# Patient Record
Sex: Female | Born: 1990 | Race: Black or African American | Hispanic: No | Marital: Single | Smoking: Never smoker
Health system: Southern US, Community
[De-identification: ages and names within clinical notes are randomized; demographics above are authoritative.]

## PROBLEM LIST (undated history)

## (undated) ENCOUNTER — Inpatient Hospital Stay (HOSPITAL_COMMUNITY): Payer: Self-pay

## (undated) DIAGNOSIS — F329 Major depressive disorder, single episode, unspecified: Secondary | ICD-10-CM

## (undated) DIAGNOSIS — R87619 Unspecified abnormal cytological findings in specimens from cervix uteri: Secondary | ICD-10-CM

## (undated) DIAGNOSIS — IMO0002 Reserved for concepts with insufficient information to code with codable children: Secondary | ICD-10-CM

## (undated) DIAGNOSIS — F32A Depression, unspecified: Secondary | ICD-10-CM

## (undated) DIAGNOSIS — F419 Anxiety disorder, unspecified: Secondary | ICD-10-CM

## (undated) DIAGNOSIS — I10 Essential (primary) hypertension: Secondary | ICD-10-CM

## (undated) DIAGNOSIS — O139 Gestational [pregnancy-induced] hypertension without significant proteinuria, unspecified trimester: Secondary | ICD-10-CM

## (undated) HISTORY — PX: THERAPEUTIC ABORTION: SHX798

## (undated) HISTORY — DX: Gestational (pregnancy-induced) hypertension without significant proteinuria, unspecified trimester: O13.9

## (undated) HISTORY — DX: Anxiety disorder, unspecified: F41.9

---
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
## 2012-02-06 NOTE — L&D Delivery Note (Signed)
Delivery Note At  a viable unspecified sex was delivered via  (Presentation: ;  ).  APGAR: , ; weight .   Placenta status: , .  Cord:  with the following complications: .  Cord pH: not done  Anesthesia: Epidural  Episiotomy:  Lacerations:  Suture Repair: 2.0 Est. Blood Loss (mL):   Mom to postpartum.  Baby to nursery-stable.  Aylah Yeary A 11/12/2012, 4:00 AM

## 2012-03-22 ENCOUNTER — Inpatient Hospital Stay (HOSPITAL_COMMUNITY): Payer: Medicaid Other

## 2012-03-22 ENCOUNTER — Inpatient Hospital Stay (HOSPITAL_COMMUNITY)
Admission: AD | Admit: 2012-03-22 | Discharge: 2012-03-22 | Disposition: A | Discharge status: Home / Self Care | Payer: Medicaid Other | Source: Ambulatory Visit | Attending: Obstetrics & Gynecology | Admitting: Obstetrics & Gynecology

## 2012-03-22 ENCOUNTER — Encounter (HOSPITAL_COMMUNITY): Payer: Self-pay | Admitting: Obstetrics and Gynecology

## 2012-03-22 DIAGNOSIS — R109 Unspecified abdominal pain: Secondary | ICD-10-CM

## 2012-03-22 DIAGNOSIS — B3731 Acute candidiasis of vulva and vagina: Secondary | ICD-10-CM | POA: Insufficient documentation

## 2012-03-22 DIAGNOSIS — O26899 Other specified pregnancy related conditions, unspecified trimester: Secondary | ICD-10-CM

## 2012-03-22 DIAGNOSIS — B373 Candidiasis of vulva and vagina: Secondary | ICD-10-CM | POA: Insufficient documentation

## 2012-03-22 DIAGNOSIS — O2390 Unspecified genitourinary tract infection in pregnancy, unspecified trimester: Secondary | ICD-10-CM | POA: Insufficient documentation

## 2012-03-22 DIAGNOSIS — R1032 Left lower quadrant pain: Secondary | ICD-10-CM | POA: Insufficient documentation

## 2012-03-22 DIAGNOSIS — O209 Hemorrhage in early pregnancy, unspecified: Secondary | ICD-10-CM | POA: Insufficient documentation

## 2012-03-22 LAB — URINALYSIS, ROUTINE W REFLEX MICROSCOPIC
Bilirubin Urine: NEGATIVE
Glucose, UA: NEGATIVE mg/dL
Hgb urine dipstick: NEGATIVE
Ketones, ur: NEGATIVE mg/dL
Protein, ur: NEGATIVE mg/dL
Urobilinogen, UA: 1 mg/dL (ref 0.0–1.0)

## 2012-03-22 LAB — CBC
MCV: 83.9 fL (ref 78.0–100.0)
Platelets: 276 10*3/uL (ref 150–400)
RBC: 4.61 MIL/uL (ref 3.87–5.11)
RDW: 13 % (ref 11.5–15.5)
WBC: 6.1 10*3/uL (ref 4.0–10.5)

## 2012-03-22 LAB — ABO/RH: ABO/RH(D): A POS

## 2012-03-22 LAB — WET PREP, GENITAL: Yeast Wet Prep HPF POC: NONE SEEN

## 2012-03-22 LAB — HCG, QUANTITATIVE, PREGNANCY: hCG, Beta Chain, Quant, S: 8202 m[IU]/mL — ABNORMAL HIGH (ref <5)

## 2012-03-22 LAB — URINE MICROSCOPIC-ADD ON

## 2012-03-22 MED ORDER — FLUCONAZOLE 150 MG PO TABS
150.0000 mg | ORAL_TABLET | Freq: Once | ORAL | Status: AC
  Administered 2012-03-22: 150 mg via ORAL
  Filled 2012-03-22: qty 1

## 2012-03-22 NOTE — MAU Note (Signed)
Pt presents to MAU with chief complaint of right and left sided pain. She had a positive pregnancy test on Tuesday.  The pain comes and goes and she thinks it may be due to her working to hard.

## 2012-03-22 NOTE — MAU Provider Note (Signed)
History     CSN: [id number]  Arrival date and time: 03/22/12 4098   First Provider Initiated Contact with Patient 03/22/12 1040      Chief Complaint  Patient presents with  . right side pain    . Possible Pregnancy   HPI Joy Rivera is a 22 y.o. G1P0000 at [redacted]w[redacted]d who presents to MAU today with LLQ pain. The patient has had pelvic pain yesterday also, but worse today. She rates it at 5/10 right now. She has had some nausea without vomiting. She denies abnormal vaginal discharge, vaginal bleeding or LOF. She has had some frequency without urgency or dysuria. She also complains of incomplete emptying of the bladder at times. She denies fever. LMP was 02/14/12.   OB History   Grav Para Term Preterm Abortions TAB SAB Ect Mult Living   1 0 0 0 0 0 0 0 0 0       Past Medical History  Diagnosis Date  . Medical history non-contributory     Past Surgical History  Procedure Laterality Date  . No past surgeries      History reviewed. No pertinent family history.  History  Substance Use Topics  . Smoking status: Never Smoker   . Smokeless tobacco: Not on file  . Alcohol Use: No    Allergies: No Known Allergies  No prescriptions prior to admission    Review of Systems  Constitutional: Negative for fever, chills and malaise/fatigue.  Gastrointestinal: Positive for nausea and abdominal pain. Negative for heartburn, vomiting, diarrhea and constipation.  Genitourinary: Negative for dysuria, urgency and frequency.        Neg vaginal bleeding Neg abnormal discharge Neg vaginal odor  Skin: Positive for itching (vaginal).   Physical Exam   Blood pressure 126/68, pulse 86, temperature 97.9 F (36.6 C), temperature source Oral, resp. rate 18, height 5\' 3"  (1.6 m), weight 170 lb 6.4 oz (77.293 kg), last menstrual period 02/14/2012.  Physical Exam  Constitutional: She is oriented to person, place, and time. She appears well-developed and well-nourished. No distress.  HENT:   Head: Normocephalic and atraumatic.  Cardiovascular: Normal rate, regular rhythm and normal heart sounds.   Respiratory: Effort normal and breath sounds normal. No respiratory distress.  GI: Soft. Bowel sounds are normal. She exhibits no distension and no mass. There is tenderness (mild tenderness to palpation of the LLQ). There is no rebound and no guarding.  Genitourinary: Vagina normal. There is no rash on the right labia. There is no rash on the left labia. Uterus is not enlarged and not tender. Cervix exhibits discharge (thick white discharge noted on the vaginal walls). Cervix exhibits no motion tenderness and no friability. Right adnexum displays no mass and no tenderness. Left adnexum displays no mass and no tenderness.  Neurological: She is alert and oriented to person, place, and time.  Skin: Skin is warm and dry.  Psychiatric: She has a normal mood and affect.   Results for orders placed during the hospital encounter of 03/22/12 (from the past 24 hour(s))  URINALYSIS, ROUTINE W REFLEX MICROSCOPIC     Status: Abnormal   Collection Time    03/22/12 10:00 AM      Result Value Range   Color, Urine YELLOW  YELLOW   APPearance CLEAR  CLEAR   Specific Gravity, Urine 1.025  1.005 - 1.030   pH 6.0  5.0 - 8.0   Glucose, UA NEGATIVE  NEGATIVE mg/dL   Hgb urine dipstick NEGATIVE  NEGATIVE  Bilirubin Urine NEGATIVE  NEGATIVE   Ketones, ur NEGATIVE  NEGATIVE mg/dL   Protein, ur NEGATIVE  NEGATIVE mg/dL   Urobilinogen, UA 1.0  0.0 - 1.0 mg/dL   Nitrite NEGATIVE  NEGATIVE   Leukocytes, UA TRACE (*) NEGATIVE  URINE MICROSCOPIC-ADD ON     Status: Abnormal   Collection Time    03/22/12 10:00 AM      Result Value Range   Squamous Epithelial / LPF FEW (*) RARE   WBC, UA 0-2  <3 WBC/hpf   RBC / HPF 0-2  <3 RBC/hpf   Bacteria, UA FEW (*) RARE  POCT PREGNANCY, URINE     Status: Abnormal   Collection Time    03/22/12 10:05 AM      Result Value Range   Preg Test, Ur POSITIVE (*) NEGATIVE   WET PREP, GENITAL     Status: Abnormal   Collection Time    03/22/12 10:49 AM      Result Value Range   Yeast Wet Prep HPF POC NONE SEEN  NONE SEEN   Trich, Wet Prep NONE SEEN  NONE SEEN   Clue Cells Wet Prep HPF POC FEW (*) NONE SEEN   WBC, Wet Prep HPF POC FEW (*) NONE SEEN  HCG, QUANTITATIVE, PREGNANCY     Status: Abnormal   Collection Time    03/22/12 10:53 AM      Result Value Range   hCG, Beta Chain, Quant, S 8202 (*) <5 mIU/mL  ABO/RH     Status: None   Collection Time    03/22/12 10:53 AM      Result Value Range   ABO/RH(D) A POS    CBC     Status: None   Collection Time    03/22/12 10:53 AM      Result Value Range   WBC 6.1  4.0 - 10.5 K/uL   RBC 4.61  3.87 - 5.11 MIL/uL   Hemoglobin 13.0  12.0 - 15.0 g/dL   HCT 16.1  09.6 - 04.5 %   MCV 83.9  78.0 - 100.0 fL   MCH 28.2  26.0 - 34.0 pg   MCHC 33.6  30.0 - 36.0 g/dL   RDW 40.9  81.1 - 91.4 %   Platelets 276  150 - 400 K/uL   *RADIOLOGY REPORT*  Clinical Data: Pelvic pain  OBSTETRIC <14 WK [place] AND TRANSVAGINAL OB US  Technique: Both transabdominal and transvaginal ultrasound  examinations were performed for complete evaluation of the  gestation as well as the maternal uterus, adnexal regions, and  pelvic cul-de-sac. Transvaginal technique was performed to assess  early pregnancy.  Comparison: None.  Intrauterine gestational sac: Visualized. Decidual ring noted.  Yolk sac: Present.  Embryo: Present.  Cardiac Activity: Present.  Heart Rate: 102 bpm  CRL: 2.4 mm five w six d [place] EDC: 11/16/2012  Maternal uterus/adnexae:  Ovaries within normal limits. Corpus luteum cyst in the left  ovary. Trace free fluid. Small subchorionic hemorrhages noted. It  is 5 x 4 x 15 mm.  IMPRESSION:  Single live intrauterine pregnancy with an estimated gestational  age of [redacted] weeks and 6 days. Fetal heart rate is 102 beats per  minute. Small subchorionic hemorrhage is associated.  Original Report Authenticated By: Jolaine Click, M.D.    MAU Course  Procedures None  MDM UA, Wet prep, GC/Chlamydia, CBC, ABO/Rh, Quant hCG and [place] today  Assessment and Plan  A: IUP at 5w 6d with normal cardiac activity Small subchorionic hemorrhage  Yeast vaginitis, clinical  P: Diflucan given in MAU today Bleeding precautions discussed Patient encouraged to call to start prenatal care with the OB of her choice Patient encouraged to return if her discharge were to worsen Pregnancy verification letter given Patient may return to MAU as needed  Freddi Starr, PA-C 03/22/2012, 10:40 AM

## 2012-03-22 NOTE — MAU Note (Signed)
Pt presents with complaints of pain in her right side and some cramps. States she took a home pregnancy test and it was positive last week.

## 2012-05-20 LAB — OB RESULTS CONSOLE RPR: RPR: NONREACTIVE

## 2012-05-20 LAB — OB RESULTS CONSOLE HEPATITIS B SURFACE ANTIGEN: Hepatitis B Surface Ag: NEGATIVE

## 2012-05-20 LAB — OB RESULTS CONSOLE RUBELLA ANTIBODY, IGM: Rubella: IMMUNE

## 2012-09-02 ENCOUNTER — Inpatient Hospital Stay (HOSPITAL_COMMUNITY)
Admission: AD | Admit: 2012-09-02 | Discharge: 2012-09-03 | Disposition: A | Discharge status: Home / Self Care | Payer: Medicaid Other | Source: Ambulatory Visit | Attending: Obstetrics | Admitting: Obstetrics

## 2012-09-02 DIAGNOSIS — M549 Dorsalgia, unspecified: Secondary | ICD-10-CM | POA: Insufficient documentation

## 2012-09-02 DIAGNOSIS — N949 Unspecified condition associated with female genital organs and menstrual cycle: Secondary | ICD-10-CM | POA: Insufficient documentation

## 2012-09-02 DIAGNOSIS — R109 Unspecified abdominal pain: Secondary | ICD-10-CM | POA: Insufficient documentation

## 2012-09-02 DIAGNOSIS — O99891 Other specified diseases and conditions complicating pregnancy: Secondary | ICD-10-CM | POA: Insufficient documentation

## 2012-09-02 HISTORY — DX: Depression, unspecified: F32.A

## 2012-09-02 HISTORY — DX: Major depressive disorder, single episode, unspecified: F32.9

## 2012-09-02 LAB — URINALYSIS, ROUTINE W REFLEX MICROSCOPIC
Glucose, UA: NEGATIVE mg/dL
Leukocytes, UA: NEGATIVE
Nitrite: NEGATIVE
pH: 6 (ref 5.0–8.0)

## 2012-09-03 ENCOUNTER — Encounter (HOSPITAL_COMMUNITY): Payer: Self-pay

## 2012-09-03 DIAGNOSIS — O9989 Other specified diseases and conditions complicating pregnancy, childbirth and the puerperium: Secondary | ICD-10-CM

## 2012-09-03 LAB — GC/CHLAMYDIA PROBE AMP: CT Probe RNA: NEGATIVE

## 2012-09-03 LAB — WET PREP, GENITAL
Trich, Wet Prep: NONE SEEN
Yeast Wet Prep HPF POC: NONE SEEN

## 2012-09-03 MED ORDER — LACTATED RINGERS IV BOLUS (SEPSIS)
1000.0000 mL | Freq: Once | INTRAVENOUS | Status: AC
  Administered 2012-09-03: 1000 mL via INTRAVENOUS

## 2012-09-03 NOTE — MAU Provider Note (Signed)
History     CSN: [id number]  Arrival date and time: 09/02/12 2323   None     No chief complaint on file.  HPI  Joy Rivera is a 22 y.o. G1P0000 at [redacted]w[redacted]d who presents today with cramping and vaginal discharge. She denies any VB or LOF and confirms fetal movement. She has not had any complications with the pregnancy at this time. She does admit to "being sad". She denies any homicidal or suicide al ideations. She has not spoke with Dr. Gaynell Face about this, but feels she has support from her family and friends.   Past Medical History  Diagnosis Date  . Medical history non-contributory   . Depression     Past Surgical History  Procedure Laterality Date  . No past surgeries      History reviewed. No pertinent family history.  History  Substance Use Topics  . Smoking status: Never Smoker   . Smokeless tobacco: Not on file  . Alcohol Use: No    Allergies: No Known Allergies  Prescriptions prior to admission  Medication Sig Dispense Refill  . diphenhydrAMINE (BENADRYL) 50 MG capsule Take 50 mg by mouth every 6 (six) hours as needed for itching.      . Prenatal Vit-Fe Fumarate-FA (PRENATAL MULTIVITAMIN) TABS Take 1 tablet by mouth daily at 12 noon.        ROS Physical Exam   Blood pressure 114/63, pulse 88, temperature 97.6 F (36.4 C), temperature source Oral, resp. rate 18, last menstrual period 02/14/2012, SpO2 100.00%.  Physical Exam  Nursing note and vitals reviewed. Constitutional: She is oriented to person, place, and time. She appears well-developed and well-nourished. No distress.  Cardiovascular: Normal rate.   Respiratory: Effort normal.  GI: Soft. There is no tenderness.  Genitourinary:   External: no lesion Vagina: small amount of white discharge Cervix: pink, smooth, closed/thick/high Uterus: AGA   Neurological: She is alert and oriented to person, place, and time.  Skin: Skin is warm and dry.  Psychiatric: She has a normal mood and affect.    FHT Cat I for 28 weeks Toco: one UC and no others MAU Course  Procedures  Results for orders placed during the hospital encounter of 09/02/12 (from the past 24 hour(s))  URINALYSIS, ROUTINE W REFLEX MICROSCOPIC     Status: Abnormal   Collection Time    09/02/12 11:30 PM      Result Value Range   Color, Urine YELLOW  YELLOW   APPearance CLEAR  CLEAR   Specific Gravity, Urine >1.030 (*) 1.005 - 1.030   pH 6.0  5.0 - 8.0   Glucose, UA NEGATIVE  NEGATIVE mg/dL   Hgb urine dipstick NEGATIVE  NEGATIVE   Bilirubin Urine NEGATIVE  NEGATIVE   Ketones, ur 15 (*) NEGATIVE mg/dL   Protein, ur NEGATIVE  NEGATIVE mg/dL   Urobilinogen, UA 0.2  0.0 - 1.0 mg/dL   Nitrite NEGATIVE  NEGATIVE   Leukocytes, UA NEGATIVE  NEGATIVE  WET PREP, GENITAL     Status: Abnormal   Collection Time    09/03/12 12:35 AM      Result Value Range   Yeast Wet Prep HPF POC NONE SEEN  NONE SEEN   Trich, Wet Prep NONE SEEN  NONE SEEN   Clue Cells Wet Prep HPF POC FEW (*) NONE SEEN   WBC, Wet Prep HPF POC MODERATE (*) NONE SEEN     Assessment and Plan   1. Back pain complicating pregnancy in third trimester  Comfort measures reviewed Discuss feelings of sadness with Dr. Gaynell Face Return to MAU as needed   Tawnya Crook 09/03/2012, 1:42 AM

## 2012-09-03 NOTE — MAU Note (Signed)
Per Zorita Pang CNM, pt may come off monitor.

## 2012-10-07 LAB — OB RESULTS CONSOLE GBS: GBS: NEGATIVE

## 2012-10-28 ENCOUNTER — Encounter (HOSPITAL_COMMUNITY): Payer: Self-pay | Admitting: General Practice

## 2012-10-28 ENCOUNTER — Inpatient Hospital Stay (HOSPITAL_COMMUNITY)
Admission: AD | Admit: 2012-10-28 | Discharge: 2012-10-28 | Disposition: A | Discharge status: Home / Self Care | Payer: Medicaid Other | Source: Ambulatory Visit | Attending: Obstetrics | Admitting: Obstetrics

## 2012-10-28 DIAGNOSIS — Z3689 Encounter for other specified antenatal screening: Secondary | ICD-10-CM

## 2012-10-28 DIAGNOSIS — O4700 False labor before 37 completed weeks of gestation, unspecified trimester: Secondary | ICD-10-CM | POA: Insufficient documentation

## 2012-10-28 DIAGNOSIS — O368130 Decreased fetal movements, third trimester, not applicable or unspecified: Secondary | ICD-10-CM

## 2012-10-28 DIAGNOSIS — O368190 Decreased fetal movements, unspecified trimester, not applicable or unspecified: Secondary | ICD-10-CM | POA: Insufficient documentation

## 2012-10-28 NOTE — MAU Note (Signed)
Per L Left-Kirby CNM pt may come off monitor

## 2012-10-28 NOTE — MAU Provider Note (Signed)
Chief Complaint:  Decreased Fetal Movement   None     HPI: Joy Rivera is a 22 y.o. G1P0000 at 51w5dwho presents to maternity admissions sent from the office for decreased fetal movement.  She reports feeling movement, but not large kicks she is used to feeling every day.  She denies regular contractions, LOF, vaginal bleeding, vaginal itching/burning, urinary symptoms, h/a, dizziness, n/v, or fever/chills.    Past Medical History: Past Medical History  Diagnosis Date  . Medical history non-contributory   . Depression     Past obstetric history: OB History  Gravida Para Term Preterm AB SAB TAB Ectopic Multiple Living  1 0 0 0 0 0 0 0 0 0     # Outcome Date GA Lbr Len/2nd Weight Sex Delivery Anes PTL Lv  1 CUR               Past Surgical History: Past Surgical History  Procedure Laterality Date  . No past surgeries      Family History: History reviewed. No pertinent family history.  Social History: History  Substance Use Topics  . Smoking status: Never Smoker   . Smokeless tobacco: Not on file  . Alcohol Use: No    Allergies: No Known Allergies  Meds:  Prescriptions prior to admission  Medication Sig Dispense Refill  . Prenatal Vit-Fe Fumarate-FA (PRENATAL MULTIVITAMIN) TABS Take 1 tablet by mouth daily at 12 noon.      . [DISCONTINUED] diphenhydrAMINE (BENADRYL) 50 MG capsule Take 50 mg by mouth every 6 (six) hours as needed for itching.        ROS: Pertinent findings in history of present illness.  Physical Exam  Blood pressure 115/66, pulse 111, temperature 98.3 F (36.8 C), temperature source Oral, resp. rate 18, height 5' 2.5" (1.588 m), weight 85.73 kg (189 lb), last menstrual period 02/14/2012. GENERAL: Well-developed, well-nourished female in no acute distress.  HEENT: normocephalic HEART: normal rate RESP: normal effort ABDOMEN: Soft, non-tender, gravid appropriate for gestational age EXTREMITIES: Nontender, no edema NEURO: alert and  oriented    FHT:  Baseline 135 , moderate variability, accelerations present, no decelerations Contractions: occasional, mild to palpation    Assessment: 1. NST (non-stress test) reactive   2. Decreased fetal movement in pregnancy in third trimester, antepartum     Plan: Consult Dr Gaynell Face Discharge home Labor precautions and fetal kick counts F/U in office Return to [facility] as needed    Medication List    ASK your doctor about these medications       diphenhydrAMINE 50 MG capsule  Commonly known as:  BENADRYL  Take 50 mg by mouth every 6 (six) hours as needed for itching.     prenatal multivitamin Tabs tablet  Take 1 tablet by mouth daily at 12 noon.        Sharen Counter Certified Nurse-Midwife 10/28/2012 8:25 PM

## 2012-10-28 NOTE — MAU Note (Signed)
No longer feeling kicks or punches; will move from side to side.   Sent from office for NST

## 2012-11-11 ENCOUNTER — Inpatient Hospital Stay (HOSPITAL_COMMUNITY)
Admission: AD | Admit: 2012-11-11 | Discharge: 2012-11-14 | DRG: 775 | Disposition: A | Discharge status: Home / Self Care | Payer: Medicaid Other | Source: Ambulatory Visit | Attending: Obstetrics | Admitting: Obstetrics

## 2012-11-11 ENCOUNTER — Inpatient Hospital Stay (HOSPITAL_COMMUNITY): Payer: Medicaid Other | Admitting: Anesthesiology

## 2012-11-11 ENCOUNTER — Inpatient Hospital Stay (HOSPITAL_COMMUNITY)
Admission: AD | Admit: 2012-11-11 | Discharge: 2012-11-11 | Disposition: A | Discharge status: Home / Self Care | Payer: Medicaid Other | Source: Ambulatory Visit | Attending: Obstetrics | Admitting: Obstetrics

## 2012-11-11 ENCOUNTER — Encounter (HOSPITAL_COMMUNITY): Payer: Self-pay | Admitting: Anesthesiology

## 2012-11-11 ENCOUNTER — Encounter (HOSPITAL_COMMUNITY): Payer: Self-pay | Admitting: *Deleted

## 2012-11-11 DIAGNOSIS — O479 False labor, unspecified: Secondary | ICD-10-CM | POA: Insufficient documentation

## 2012-11-11 HISTORY — DX: Unspecified abnormal cytological findings in specimens from cervix uteri: R87.619

## 2012-11-11 HISTORY — DX: Reserved for concepts with insufficient information to code with codable children: IMO0002

## 2012-11-11 LAB — RPR: RPR Ser Ql: NONREACTIVE

## 2012-11-11 LAB — CBC
HCT: 35.9 % — ABNORMAL LOW (ref 36.0–46.0)
Hemoglobin: 12.3 g/dL (ref 12.0–15.0)
MCH: 28 pg (ref 26.0–34.0)
MCHC: 34.3 g/dL (ref 30.0–36.0)
MCV: 81.6 fL (ref 78.0–100.0)
RBC: 4.4 MIL/uL (ref 3.87–5.11)
RDW: 13.2 % (ref 11.5–15.5)

## 2012-11-11 MED ORDER — ACETAMINOPHEN-CODEINE #3 300-30 MG PO TABS
1.0000 | ORAL_TABLET | Freq: Once | ORAL | Status: AC
  Administered 2012-11-11: 1 via ORAL
  Filled 2012-11-11: qty 1

## 2012-11-11 MED ORDER — BUTORPHANOL TARTRATE 1 MG/ML IJ SOLN
1.0000 mg | INTRAMUSCULAR | Status: DC | PRN
  Administered 2012-11-11: 1 mg via INTRAVENOUS
  Filled 2012-11-11: qty 1

## 2012-11-11 MED ORDER — LACTATED RINGERS IV SOLN: 500.0000 mL | INTRAVENOUS | Status: DC | PRN

## 2012-11-11 MED ORDER — PHENYLEPHRINE 40 MCG/ML (10ML) SYRINGE FOR IV PUSH (FOR BLOOD PRESSURE SUPPORT)
80.0000 ug | PREFILLED_SYRINGE | INTRAVENOUS | Status: DC | PRN
  Filled 2012-11-11: qty 2

## 2012-11-11 MED ORDER — PHENYLEPHRINE 40 MCG/ML (10ML) SYRINGE FOR IV PUSH (FOR BLOOD PRESSURE SUPPORT)
80.0000 ug | PREFILLED_SYRINGE | INTRAVENOUS | Status: DC | PRN
  Filled 2012-11-11: qty 5
  Filled 2012-11-11: qty 2

## 2012-11-11 MED ORDER — OXYTOCIN 40 UNITS IN LACTATED RINGERS INFUSION - SIMPLE MED
1.0000 m[IU]/min | INTRAVENOUS | Status: DC
  Administered 2012-11-11: 4 m[IU]/min via INTRAVENOUS
  Administered 2012-11-11: 6 m[IU]/min via INTRAVENOUS
  Administered 2012-11-11: 2 m[IU]/min via INTRAVENOUS
  Filled 2012-11-11: qty 1000

## 2012-11-11 MED ORDER — IBUPROFEN 600 MG PO TABS
600.0000 mg | ORAL_TABLET | Freq: Four times a day (QID) | ORAL | Status: DC | PRN
  Administered 2012-11-12: 600 mg via ORAL
  Filled 2012-11-11 (×2): qty 1

## 2012-11-11 MED ORDER — LIDOCAINE HCL (PF) 1 % IJ SOLN
30.0000 mL | INTRAMUSCULAR | Status: DC | PRN
  Filled 2012-11-11 (×2): qty 30

## 2012-11-11 MED ORDER — TERBUTALINE SULFATE 1 MG/ML IJ SOLN: 0.2500 mg | Freq: Once | INTRAMUSCULAR | Status: AC | PRN

## 2012-11-11 MED ORDER — LACTATED RINGERS IV SOLN
INTRAVENOUS | Status: DC
  Administered 2012-11-11 (×5): via INTRAVENOUS

## 2012-11-11 MED ORDER — EPHEDRINE 5 MG/ML INJ
10.0000 mg | INTRAVENOUS | Status: DC | PRN
  Administered 2012-11-11: 10 mg via INTRAVENOUS
  Filled 2012-11-11: qty 2

## 2012-11-11 MED ORDER — OXYCODONE-ACETAMINOPHEN 5-325 MG PO TABS
1.0000 | ORAL_TABLET | ORAL | Status: DC | PRN
  Administered 2012-11-12: 1 via ORAL
  Filled 2012-11-11: qty 1

## 2012-11-11 MED ORDER — DIPHENHYDRAMINE HCL 50 MG/ML IJ SOLN
12.5000 mg | INTRAMUSCULAR | Status: DC | PRN
  Administered 2012-11-12: 12.5 mg via INTRAVENOUS
  Filled 2012-11-11: qty 1

## 2012-11-11 MED ORDER — ONDANSETRON HCL 4 MG/2ML IJ SOLN
4.0000 mg | Freq: Four times a day (QID) | INTRAMUSCULAR | Status: DC | PRN
  Administered 2012-11-11: 4 mg via INTRAVENOUS
  Filled 2012-11-11: qty 2

## 2012-11-11 MED ORDER — FLEET ENEMA 7-19 GM/118ML RE ENEM: 1.0000 | ENEMA | RECTAL | Status: DC | PRN

## 2012-11-11 MED ORDER — FENTANYL 2.5 MCG/ML BUPIVACAINE 1/10 % EPIDURAL INFUSION (WH - ANES)
14.0000 mL/h | INTRAMUSCULAR | Status: DC | PRN
  Administered 2012-11-11 (×2): 14 mL/h via EPIDURAL
  Filled 2012-11-11 (×2): qty 125

## 2012-11-11 MED ORDER — LACTATED RINGERS IV SOLN: 500.0000 mL | Freq: Once | INTRAVENOUS | Status: DC

## 2012-11-11 MED ORDER — SODIUM BICARBONATE 8.4 % IV SOLN
INTRAVENOUS | Status: DC | PRN
  Administered 2012-11-11: 5 mL via EPIDURAL

## 2012-11-11 MED ORDER — EPHEDRINE 5 MG/ML INJ
10.0000 mg | INTRAVENOUS | Status: DC | PRN
  Filled 2012-11-11: qty 4
  Filled 2012-11-11: qty 2

## 2012-11-11 MED ORDER — OXYTOCIN BOLUS FROM INFUSION
500.0000 mL | INTRAVENOUS | Status: DC
  Administered 2012-11-12: 500 mL via INTRAVENOUS

## 2012-11-11 MED ORDER — OXYTOCIN 40 UNITS IN LACTATED RINGERS INFUSION - SIMPLE MED: 62.5000 mL/h | INTRAVENOUS | Status: DC

## 2012-11-11 MED ORDER — ACETAMINOPHEN 325 MG PO TABS: 650.0000 mg | ORAL_TABLET | ORAL | Status: DC | PRN

## 2012-11-11 MED ORDER — CITRIC ACID-SODIUM CITRATE 334-500 MG/5ML PO SOLN: 30.0000 mL | ORAL | Status: DC | PRN

## 2012-11-11 NOTE — Anesthesia Preprocedure Evaluation (Signed)

## 2012-11-11 NOTE — MAU Note (Signed)
Sent from OB's office for direct admit;

## 2012-11-11 NOTE — H&P (Signed)
This is Dr. Francoise Ceo dictating the history and physical on  Joy Rivera she's a 22 year old gravida 139 weeks and 5 days EDC 11/13/2012 negative GBS admitted in early labor cervix was 1 cm now 3 cm 100% vertex -1-2 amniotomy performed and the fluid was meconium-stained an IUPC was inserted and she is contracting every 2-3 minutes on Past medical history negative Past surgical history negative Social history negative System review noncontributory Physical exam well-developed female in labor HEENT negative Lungs clear to P&A Breasts negative Abdomen term Pelvic as described above Extremities negative and

## 2012-11-11 NOTE — Anesthesia Procedure Notes (Signed)

## 2012-11-11 NOTE — MAU Note (Signed)
Scheduled for induction in 4 days;

## 2012-11-11 NOTE — MAU Note (Signed)
C/o ucs since about 0500 this AM;

## 2012-11-12 ENCOUNTER — Encounter (HOSPITAL_COMMUNITY): Payer: Self-pay

## 2012-11-12 LAB — CBC
MCH: 28.1 pg (ref 26.0–34.0)
MCHC: 34.4 g/dL (ref 30.0–36.0)
MCV: 81.8 fL (ref 78.0–100.0)
Platelets: 161 10*3/uL (ref 150–400)
RBC: 3.95 MIL/uL (ref 3.87–5.11)
RDW: 13.3 % (ref 11.5–15.5)
WBC: 13.9 10*3/uL — ABNORMAL HIGH (ref 4.0–10.5)

## 2012-11-12 MED ORDER — TETANUS-DIPHTH-ACELL PERTUSSIS 5-2.5-18.5 LF-MCG/0.5 IM SUSP
0.5000 mL | Freq: Once | INTRAMUSCULAR | Status: AC
  Administered 2012-11-14: 0.5 mL via INTRAMUSCULAR
  Filled 2012-11-12: qty 0.5

## 2012-11-12 MED ORDER — OXYCODONE-ACETAMINOPHEN 5-325 MG PO TABS: 1.0000 | ORAL_TABLET | ORAL | Status: DC | PRN

## 2012-11-12 MED ORDER — DIPHENHYDRAMINE HCL 25 MG PO CAPS: 25.0000 mg | ORAL_CAPSULE | Freq: Four times a day (QID) | ORAL | Status: DC | PRN

## 2012-11-12 MED ORDER — ONDANSETRON HCL 4 MG/2ML IJ SOLN: 4.0000 mg | INTRAMUSCULAR | Status: DC | PRN

## 2012-11-12 MED ORDER — WITCH HAZEL-GLYCERIN EX PADS: 1.0000 "application " | MEDICATED_PAD | CUTANEOUS | Status: DC | PRN

## 2012-11-12 MED ORDER — IBUPROFEN 600 MG PO TABS
600.0000 mg | ORAL_TABLET | Freq: Four times a day (QID) | ORAL | Status: DC
  Administered 2012-11-12 – 2012-11-14 (×8): 600 mg via ORAL
  Filled 2012-11-12 (×7): qty 1

## 2012-11-12 MED ORDER — ZOLPIDEM TARTRATE 5 MG PO TABS: 5.0000 mg | ORAL_TABLET | Freq: Every evening | ORAL | Status: DC | PRN

## 2012-11-12 MED ORDER — PRENATAL MULTIVITAMIN CH: 1.0000 | ORAL_TABLET | Freq: Every day | ORAL | Status: DC

## 2012-11-12 MED ORDER — BENZOCAINE-MENTHOL 20-0.5 % EX AERO: 1.0000 "application " | INHALATION_SPRAY | CUTANEOUS | Status: DC | PRN

## 2012-11-12 MED ORDER — LANOLIN HYDROUS EX OINT: TOPICAL_OINTMENT | CUTANEOUS | Status: DC | PRN

## 2012-11-12 MED ORDER — SIMETHICONE 80 MG PO CHEW: 80.0000 mg | CHEWABLE_TABLET | ORAL | Status: DC | PRN

## 2012-11-12 MED ORDER — DIBUCAINE 1 % RE OINT: 1.0000 "application " | TOPICAL_OINTMENT | RECTAL | Status: DC | PRN

## 2012-11-12 MED ORDER — SENNOSIDES-DOCUSATE SODIUM 8.6-50 MG PO TABS
2.0000 | ORAL_TABLET | ORAL | Status: DC
  Administered 2012-11-13 – 2012-11-14 (×2): 2 via ORAL
  Filled 2012-11-12 (×2): qty 2

## 2012-11-12 MED ORDER — PRENATAL MULTIVITAMIN CH
1.0000 | ORAL_TABLET | Freq: Every day | ORAL | Status: DC
  Administered 2012-11-12 – 2012-11-13 (×2): 1 via ORAL
  Filled 2012-11-12 (×2): qty 1

## 2012-11-12 MED ORDER — FERROUS SULFATE 325 (65 FE) MG PO TABS
325.0000 mg | ORAL_TABLET | Freq: Two times a day (BID) | ORAL | Status: DC
  Administered 2012-11-12 – 2012-11-14 (×5): 325 mg via ORAL
  Filled 2012-11-12 (×5): qty 1

## 2012-11-12 MED ORDER — ONDANSETRON HCL 4 MG PO TABS: 4.0000 mg | ORAL_TABLET | ORAL | Status: DC | PRN

## 2012-11-12 NOTE — Anesthesia Postprocedure Evaluation (Signed)
Anesthesia Post Note  Patient: Joy Rivera  Procedure(s) Performed: * No procedures listed *  Anesthesia type: Epidural  Patient location: Mother/Baby  Post pain: Pain level controlled  Post assessment: Post-op Vital signs reviewed  Last Vitals:  Filed Vitals:   11/12/12 0655  BP: 101/63  Pulse: 76  Temp: 37.1 C  Resp: 18    Post vital signs: Reviewed  Level of consciousness:alert  Complications: No apparent anesthesia complications

## 2012-11-12 NOTE — Progress Notes (Signed)
Ur chart review completed.  

## 2012-11-12 NOTE — Progress Notes (Signed)
Patient ID: Joy Rivera, female   DOB: July 28, 1990, 22 y.o.   MRN: [id number] And postpartum day 0 vital signs normal Fundus firm Legs negative Doing well

## 2012-11-12 NOTE — Consult Note (Signed)
Neonatology Note:  Attendance at Delivery:  I was asked by Dr. Marshall to attend this NSVD at term due to thick meconium. The mother is a G1P0 A pos, GBS neg with an otherwise uncomplicated pregnancy. ROM 13 hours prior to delivery, fluid with moderate meconium initially, but thick at delivery. Our team arrived at about 30 seconds of life. Infant was dusky but with good tone and spontaneous cry. We performed bulb suctioning. On auscultation, there were some rales, especially on the right side, so we did chest percussion and DeLee suctioned, getting 2 ml of thick green fluid out. After this, the lungs were clear bilaterally and the baby had no distress. Color was pink in room air at 5 minutes of life. Ap 7/9. To CN to care of Pediatrician.  Joy Schuenemann C. Legacy Lacivita, MD  

## 2012-11-13 NOTE — Progress Notes (Signed)
Patient ID: Joy Rivera, female   DOB: [birth year], 22 y.o.   MRN: [id number] Postpartum day one Vital signs normal Fundus firm Lochia moderate Doing well

## 2012-11-14 ENCOUNTER — Inpatient Hospital Stay (HOSPITAL_COMMUNITY): Admission: RE | Admit: 2012-11-14 | Payer: Medicaid Other | Source: Ambulatory Visit

## 2012-11-14 NOTE — Discharge Summary (Signed)
Obstetric Discharge Summary Reason for Admission: onset of labor Prenatal Procedures: none Intrapartum Procedures: spontaneous vaginal delivery Postpartum Procedures: none Complications-Operative and Postpartum: none Hemoglobin  Date Value Range Status  11/12/2012 11.1* 12.0 - 15.0 g/dL Final     HCT  Date Value Range Status  11/12/2012 32.3* 36.0 - 46.0 % Final    Physical Exam:  General: alert Lochia: appropriate Uterine Fundus: firm Incision: healing well DVT Evaluation: No evidence of DVT seen on physical exam.  Discharge Diagnoses: Term Pregnancy-delivered  Discharge Information: Date: 11/14/2012 Activity: pelvic rest Diet: routine Medications: Percocet Condition: stable Instructions: refer to practice specific booklet Discharge to: home Follow-up Information   Follow up with Kathreen Cosier, MD.   Specialty:  Obstetrics and Gynecology   Contact information:   [address] [place] [address] [phone]       Newborn Data: Live born female  Birth Weight: 7 lb 8.1 oz (3405 g) APGAR: 7, 9  Home with mother.  MARSHALL,BERNARD A 11/14/2012, 7:34 AM

## 2013-12-07 ENCOUNTER — Encounter (HOSPITAL_COMMUNITY): Payer: Self-pay

## 2014-04-08 ENCOUNTER — Inpatient Hospital Stay (HOSPITAL_COMMUNITY)
Admission: AD | Admit: 2014-04-08 | Discharge: 2014-04-09 | Disposition: A | Discharge status: Home / Self Care | Payer: Self-pay | Source: Ambulatory Visit | Attending: Family Medicine | Admitting: Family Medicine

## 2014-04-08 DIAGNOSIS — N949 Unspecified condition associated with female genital organs and menstrual cycle: Secondary | ICD-10-CM | POA: Insufficient documentation

## 2014-04-08 DIAGNOSIS — B373 Candidiasis of vulva and vagina: Secondary | ICD-10-CM | POA: Insufficient documentation

## 2014-04-08 DIAGNOSIS — R102 Pelvic and perineal pain: Secondary | ICD-10-CM

## 2014-04-08 DIAGNOSIS — O98811 Other maternal infectious and parasitic diseases complicating pregnancy, first trimester: Secondary | ICD-10-CM | POA: Insufficient documentation

## 2014-04-08 DIAGNOSIS — B3731 Acute candidiasis of vulva and vagina: Secondary | ICD-10-CM

## 2014-04-08 DIAGNOSIS — O219 Vomiting of pregnancy, unspecified: Secondary | ICD-10-CM

## 2014-04-08 DIAGNOSIS — Z3A09 9 weeks gestation of pregnancy: Secondary | ICD-10-CM | POA: Insufficient documentation

## 2014-04-08 DIAGNOSIS — O26891 Other specified pregnancy related conditions, first trimester: Secondary | ICD-10-CM

## 2014-04-08 DIAGNOSIS — O210 Mild hyperemesis gravidarum: Secondary | ICD-10-CM | POA: Insufficient documentation

## 2014-04-09 ENCOUNTER — Inpatient Hospital Stay (HOSPITAL_COMMUNITY): Payer: Medicaid Other

## 2014-04-09 ENCOUNTER — Encounter (HOSPITAL_COMMUNITY): Payer: Self-pay | Admitting: *Deleted

## 2014-04-09 DIAGNOSIS — O219 Vomiting of pregnancy, unspecified: Secondary | ICD-10-CM

## 2014-04-09 DIAGNOSIS — Z3A09 9 weeks gestation of pregnancy: Secondary | ICD-10-CM

## 2014-04-09 LAB — URINALYSIS, ROUTINE W REFLEX MICROSCOPIC
BILIRUBIN URINE: NEGATIVE
GLUCOSE, UA: NEGATIVE mg/dL
HGB URINE DIPSTICK: NEGATIVE
Ketones, ur: NEGATIVE mg/dL
Leukocytes, UA: NEGATIVE
NITRITE: NEGATIVE
PH: 7 (ref 5.0–8.0)
Protein, ur: NEGATIVE mg/dL
SPECIFIC GRAVITY, URINE: 1.02 (ref 1.005–1.030)
Urobilinogen, UA: 0.2 mg/dL (ref 0.0–1.0)

## 2014-04-09 LAB — ABO/RH: ABO/RH(D): A POS

## 2014-04-09 LAB — CBC
HCT: 36.8 % (ref 36.0–46.0)
Hemoglobin: 12.5 g/dL (ref 12.0–15.0)
MCH: 28.7 pg (ref 26.0–34.0)
MCHC: 34 g/dL (ref 30.0–36.0)
MCV: 84.6 fL (ref 78.0–100.0)
Platelets: 203 10*3/uL (ref 150–400)
RBC: 4.35 MIL/uL (ref 3.87–5.11)
RDW: 12.4 % (ref 11.5–15.5)
WBC: 6.9 10*3/uL (ref 4.0–10.5)

## 2014-04-09 LAB — WET PREP, GENITAL
Clue Cells Wet Prep HPF POC: NONE SEEN
Trich, Wet Prep: NONE SEEN

## 2014-04-09 LAB — HCG, QUANTITATIVE, PREGNANCY: HCG, BETA CHAIN, QUANT, S: 142409 m[IU]/mL — AB (ref <5)

## 2014-04-09 LAB — GC/CHLAMYDIA PROBE AMP (~~LOC~~) NOT AT ARMC
Chlamydia: POSITIVE — AB
Neisseria Gonorrhea: NEGATIVE

## 2014-04-09 LAB — POCT PREGNANCY, URINE: Preg Test, Ur: POSITIVE — AB

## 2014-04-09 MED ORDER — TERCONAZOLE 0.4 % VA CREA: 1.0000 | TOPICAL_CREAM | Freq: Every day | VAGINAL | Status: DC

## 2014-04-09 MED ORDER — PROMETHAZINE HCL 25 MG PO TABS: 25.0000 mg | ORAL_TABLET | Freq: Once | ORAL | Status: DC

## 2014-04-09 MED ORDER — ACETAMINOPHEN 500 MG PO TABS
1000.0000 mg | ORAL_TABLET | Freq: Once | ORAL | Status: AC
  Administered 2014-04-09: 1000 mg via ORAL
  Filled 2014-04-09: qty 2

## 2014-04-09 MED ORDER — PROMETHAZINE HCL 25 MG PO TABS: 12.5000 mg | ORAL_TABLET | Freq: Four times a day (QID) | ORAL | Status: DC | PRN

## 2014-04-09 NOTE — MAU Note (Addendum)
PT  SAYS SHE DID HPT IN FEB-   3-  BOTH POSITIVE.      SAYS CRAMPS,  BLEEDING  AND BACK PAIN    STARTED  BAD LAST WED.    NO BLOOD  ON PERINEUM  NOW.    VOMITING  TODAY.  LAST SEX-  2-10.  NO  BIRTH  CONTROL   TOOK IBUPROFEN  800 MG  AT 1040  PM.

## 2014-04-09 NOTE — MAU Note (Signed)
PO ICE CHIPS  

## 2014-04-09 NOTE — MAU Provider Note (Signed)
History     CSN: [id number]  Arrival date and time: 04/08/14 2342   First Provider Initiated Contact with Patient 04/09/14 0024      No chief complaint on file.  HPI  Joy Rivera is a. 24 y.o. G2P1001 at [redacted]w[redacted]d who presents today with cramping and bleeding. She states that she has had cramps for about 2 days, and she had bleeding 04/07/14 and 04/08/14. She states that the bleeding has stopped now. She is also having nausea and vomiting. She does not have any medications for nausea at this time. She states that she did take ibuprofen for the cramps, and it has helepd some. She rates her pain 6.5/10.   Past Medical History  Diagnosis Date  . Medical history non-contributory   . Depression   . Abnormal Pap smear     Past Surgical History  Procedure Laterality Date  . No past surgeries      Family History  Problem Relation Age of Onset  . Hypertension Father   . Heart disease Father   . Cancer Maternal Aunt   . Cancer Maternal Uncle   . Hypertension Paternal Grandmother     History  Substance Use Topics  . Smoking status: Never Smoker   . Smokeless tobacco: Never Used  . Alcohol Use: No    Allergies: No Known Allergies  No prescriptions prior to admission    ROS Physical Exam   Blood pressure 115/68, pulse 90, temperature 99.1 F (37.3 C), temperature source Oral, resp. rate 18, height  (1.575 m), weight 78.529 kg (173 lb 2 oz), last menstrual period 02/05/2014, SpO2 99 %, unknown if currently breastfeeding.  Physical Exam  Nursing note and vitals reviewed. Constitutional: She is oriented to person, place, and time. She appears well-developed and well-nourished. No distress.  Cardiovascular: Normal rate.   Respiratory: Effort normal.  GI: Soft. There is no tenderness. There is no rebound.  Genitourinary:   External: no lesion Vagina: small amount of white discharge Cervix: pink, smooth, no CMT Uterus: 9 weeks size  Adnexa: NT   Neurological: She is  alert and oriented to person, place, and time.  Skin: Skin is warm and dry.  Psychiatric: She has a normal mood and affect.    MAU Course  Procedures  Results for orders placed or performed during the hospital encounter of 04/08/14 (from the past 24 hour(s))  Urinalysis, Routine w reflex microscopic     Status: None   Collection Time: 04/08/14 11:57 PM  Result Value Ref Range   Color, Urine YELLOW YELLOW   APPearance CLEAR CLEAR   Specific Gravity, Urine 1.020 1.005 - 1.030   pH 7.0 5.0 - 8.0   Glucose, UA NEGATIVE NEGATIVE mg/dL   Hgb urine dipstick NEGATIVE NEGATIVE   Bilirubin Urine NEGATIVE NEGATIVE   Ketones, ur NEGATIVE NEGATIVE mg/dL   Protein, ur NEGATIVE NEGATIVE mg/dL   Urobilinogen, UA 0.2 0.0 - 1.0 mg/dL   Nitrite NEGATIVE NEGATIVE   Leukocytes, UA NEGATIVE NEGATIVE  Pregnancy, urine POC     Status: Abnormal   Collection Time: 04/09/14 12:13 AM  Result Value Ref Range   Preg Test, Ur POSITIVE (A) NEGATIVE  Wet prep, genital     Status: Abnormal   Collection Time: 04/09/14 12:30 AM  Result Value Ref Range   Yeast Wet Prep HPF POC FEW (A) NONE SEEN   Trich, Wet Prep NONE SEEN NONE SEEN   Clue Cells Wet Prep HPF POC NONE SEEN NONE SEEN  WBC, Wet Prep HPF POC MODERATE (A) NONE SEEN  CBC     Status: None   Collection Time: 04/09/14 12:45 AM  Result Value Ref Range   WBC 6.9 4.0 - 10.5 K/uL   RBC 4.35 3.87 - 5.11 MIL/uL   Hemoglobin 12.5 12.0 - 15.0 g/dL   HCT 16.136.8 09.636.0 - 04.546.0 %   MCV 84.6 78.0 - 100.0 fL   MCH 28.7 26.0 - 34.0 pg   MCHC 34.0 30.0 - 36.0 g/dL   RDW 40.912.4 81.111.5 - 91.415.5 %   Platelets 203 150 - 400 K/uL  ABO/Rh     Status: None   Collection Time: 04/09/14 12:45 AM  Result Value Ref Range   ABO/RH(D) A POS   hCG, quantitative, pregnancy     Status: Abnormal   Collection Time: 04/09/14 12:45 AM  Result Value Ref Range   hCG, Beta Chain, Quant, S 142409 (H) <5 mIU/mL   [place] Ob Comp Less 14 Wks  04/09/2014   CLINICAL DATA:  Pelvic pain and pressure  in first trimester pregnancy  EXAM: OBSTETRIC <14 WK US AND TRANSVAGINAL OB US  TECHNIQUE: Both transabdominal and transvaginal ultrasound examinations were performed for complete evaluation of the gestation as well as the maternal uterus, adnexal regions, and pelvic cul-de-sac. Transvaginal technique was performed to assess early pregnancy.  COMPARISON:  03/22/2012  FINDINGS: Intrauterine gestational sac: Present and normal in shape. There is a subchorionic hematoma along the right sac, measuring up to 15 mm.  Yolk sac:  Present and normal in size  Embryo:  Present  Cardiac Activity: Present  Heart Rate: 170  bpm  CRL:  25.6  mm   9 w   3 d                  US EDC: 11/09/2014  Maternal uterus/adnexae: Physiologic appearance of the ovaries with a left corpus luteum noted.  IMPRESSION: 1. Single, living intrauterine gestation at 9 weeks 3 days. 2. 15 mm subchorionic hematoma.   Electronically Signed   By: Marnee SpringJonathon  Watts M.D.   On: 04/09/2014 02:01   [place] Ob Transvaginal  04/09/2014   CLINICAL DATA:  Pelvic pain and pressure in first trimester pregnancy  EXAM: OBSTETRIC <14 WK US AND TRANSVAGINAL OB US  TECHNIQUE: Both transabdominal and transvaginal ultrasound examinations were performed for complete evaluation of the gestation as well as the maternal uterus, adnexal regions, and pelvic cul-de-sac. Transvaginal technique was performed to assess early pregnancy.  COMPARISON:  03/22/2012  FINDINGS: Intrauterine gestational sac: Present and normal in shape. There is a subchorionic hematoma along the right sac, measuring up to 15 mm.  Yolk sac:  Present and normal in size  Embryo:  Present  Cardiac Activity: Present  Heart Rate: 170  bpm  CRL:  25.6  mm   9 w   3 d                  US EDC: 11/09/2014  Maternal uterus/adnexae: Physiologic appearance of the ovaries with a left corpus luteum noted.  IMPRESSION: 1. Single, living intrauterine gestation at 9 weeks 3 days. 2. 15 mm subchorionic hematoma.   Electronically  Signed   By: Marnee SpringJonathon  Watts M.D.   On: 04/09/2014 02:01     Assessment and Plan   1. Nausea/vomiting in pregnancy   2. Pelvic pain affecting pregnancy in first trimester, antepartum   3. Yeast infection involving the vagina and surrounding area    DC home First  trimester precautions Rx phenergan and terazol  Return to MAU as needed Start [facility] as soon as possible List of providers given Pregnancy verification letter given  Follow-up Information    Follow up with [facility] [place].   Contact information:   [address] [facility] [address] [phone]       Tawnya Crook 04/09/2014, 2:12 AM

## 2014-04-09 NOTE — Discharge Instructions (Signed)
Prenatal Care Providers °[facility]    [facility] ° Phone- [phone]     Phone: [phone] °         °[facility]                      [facility] ° @[place]     Phone: [phone] ° Phone: [phone] °        [facility] °[facility]     Phone: [phone] ° Phone: [phone]   °        [facility] °[facility] [place]                hone: [phone] ° Phone: [phone] °        [facility] °Dr. Bernard Marshall      Phone: [phone] ° Phone: [phone] °        [facility] °[facility]                Phone: [phone] ° Women’s [facility]  ° Phone:[phone]    [facility] °         Phone: [phone] °[facility] °  Phone: [phone] °Safe Medications in Pregnancy  ° °Acne: °Benzoyl Peroxide °Salicylic Acid ° °Backache/Headache: °Tylenol: 2 regular strength every 4 hours OR °             2 Extra strength every 6 hours ° °Colds/Coughs/Allergies: °Benadryl (alcohol free) 25 mg every 6 hours as needed °Breath right strips °Claritin °Cepacol throat lozenges °Chloraseptic throat spray °Cold-Eeze- up to three times per day °Cough drops, alcohol free °Flonase (by prescription only) °Guaifenesin °Mucinex °Robitussin DM (plain only, alcohol free) °Saline nasal spray/drops °Sudafed (pseudoephedrine) & Actifed ** use only after [redacted] weeks gestation and if you do not have high blood pressure °Tylenol °Vicks Vaporub °Zinc lozenges °Zyrtec  ° °Constipation: °Colace °Ducolax suppositories °Fleet enema °Glycerin suppositories °Metamucil °Milk of magnesia °Miralax °Senokot °Smooth move tea ° °Diarrhea: °Kaopectate °Imodium A-D ° °*NO pepto Bismol ° °Hemorrhoids: °Anusol °Anusol HC °Preparation H °Tucks ° °Indigestion: °Tums °Maalox °Mylanta °Zantac  °Pepcid ° °Insomnia: °Benadryl (alcohol free) 25mg every 6 hours as needed °Tylenol  PM °Unisom, no Gelcaps ° °Leg Cramps: °Tums °MagGel ° °Nausea/Vomiting:  °Bonine °Dramamine °Emetrol °Ginger extract °Sea bands °Meclizine  °Nausea medication to take during pregnancy:  °Unisom (doxylamine succinate 25 mg tablets) Take one tablet daily at bedtime. If symptoms are not adequately controlled, the dose can be increased to a maximum recommended dose of two tablets daily (1/2 tablet in the morning, 1/2 tablet mid-afternoon and one at bedtime). °Vitamin B6 100mg tablets. Take one tablet twice a day (up to 200 mg per day). ° °Skin Rashes: °Aveeno products °Benadryl cream or 25mg every 6 hours as needed °Calamine Lotion °1% cortisone cream ° °Yeast infection: °Gyne-lotrimin 7 °Monistat 7 ° ° °**If taking multiple medications, please check labels to avoid duplicating the same active ingredients °**take medication as directed on the label °** Do not exceed 4000 mg of tylenol in 24 hours °**Do not take medications that contain aspirin or ibuprofen ° ° °First Trimester of Pregnancy °The first trimester of pregnancy is from week 1 until the end of week 12 (months 1 through 3). A week after a sperm fertilizes an egg, the egg will implant on the wall of the uterus. This embryo will   begin to develop into a baby. Genes from you and your partner are forming the baby. The female genes determine whether the baby is a boy or a girl. At 6-8 weeks, the eyes and face are formed, and the heartbeat can be seen on ultrasound. At the end of 12 weeks, all the baby's organs are formed.  °Now that you are pregnant, you will want to do everything you can to have a healthy baby. Two of the most important things are to get good prenatal care and to follow your health care provider's instructions. Prenatal care is all the medical care you receive before the baby's birth. This care will help prevent, find, and treat any problems during the pregnancy and childbirth. °BODY CHANGES °Your body goes through many changes during pregnancy. The  changes vary from woman to woman.  °· You may gain or lose a couple of pounds at first. °· You may feel sick to your stomach (nauseous) and throw up (vomit). If the vomiting is uncontrollable, call your health care provider. °· You may tire easily. °· You may develop headaches that can be relieved by medicines approved by your health care provider. °· You may urinate more often. Painful urination may mean you have a bladder infection. °· You may develop heartburn as a result of your pregnancy. °· You may develop constipation because certain hormones are causing the muscles that push waste through your intestines to slow down. °· You may develop hemorrhoids or swollen, bulging veins (varicose veins). °· Your breasts may begin to grow larger and become tender. Your nipples may stick out more, and the tissue that surrounds them (areola) may become darker. °· Your gums may bleed and may be sensitive to brushing and flossing. °· Dark spots or blotches (chloasma, mask of pregnancy) may develop on your face. This will likely fade after the baby is born. °· Your menstrual periods will stop. °· You may have a loss of appetite. °· You may develop cravings for certain kinds of food. °· You may have changes in your emotions from day to day, such as being excited to be pregnant or being concerned that something may go wrong with the pregnancy and baby. °· You may have more vivid and strange dreams. °· You may have changes in your hair. These can include thickening of your hair, rapid growth, and changes in texture. Some women also have hair loss during or after pregnancy, or hair that feels dry or thin. Your hair will most likely return to normal after your baby is born. °WHAT TO EXPECT AT YOUR PRENATAL VISITS °During a routine prenatal visit: °· You will be weighed to make sure you and the baby are growing normally. °· Your blood pressure will be taken. °· Your abdomen will be measured to track your baby's growth. °· The fetal  heartbeat will be listened to starting around week 10 or 12 of your pregnancy. °· Test results from any previous visits will be discussed. °Your health care provider may ask you: °· How you are feeling. °· If you are feeling the baby move. °· If you have had any abnormal symptoms, such as leaking fluid, bleeding, severe headaches, or abdominal cramping. °· If you have any questions. °Other tests that may be performed during your first trimester include: °· Blood tests to find your blood type and to check for the presence of any previous infections. They will also be used to check for low iron levels (anemia) and Rh antibodies. Later   in the pregnancy, blood tests for diabetes will be done along with other tests if problems develop. °· Urine tests to check for infections, diabetes, or protein in the urine. °· An ultrasound to confirm the proper growth and development of the baby. °· An amniocentesis to check for possible genetic problems. °· Fetal screens for spina bifida and Down syndrome. °· You may need other tests to make sure you and the baby are doing well. °HOME CARE INSTRUCTIONS  °Medicines °· Follow your health care provider's instructions regarding medicine use. Specific medicines may be either safe or unsafe to take during pregnancy. °· Take your prenatal vitamins as directed. °· If you develop constipation, try taking a stool softener if your health care provider approves. °Diet °· Eat regular, well-balanced meals. Choose a variety of foods, such as meat or vegetable-based protein, fish, milk and low-fat dairy products, vegetables, fruits, and whole grain breads and cereals. Your health care provider will help you determine the amount of weight gain that is right for you. °· Avoid raw meat and uncooked cheese. These carry germs that can cause birth defects in the baby. °· Eating four or five small meals rather than three large meals a day may help relieve nausea and vomiting. If you start to feel nauseous,  eating a few soda crackers can be helpful. Drinking liquids between meals instead of during meals also seems to help nausea and vomiting. °· If you develop constipation, eat more high-fiber foods, such as fresh vegetables or fruit and whole grains. Drink enough fluids to keep your urine clear or pale yellow. °Activity and Exercise °· Exercise only as directed by your health care provider. Exercising will help you: °¨ Control your weight. °¨ Stay in shape. °¨ Be prepared for labor and delivery. °· Experiencing pain or cramping in the lower abdomen or low back is a good sign that you should stop exercising. Check with your health care provider before continuing normal exercises. °· Try to avoid standing for long periods of time. Move your legs often if you must stand in one place for a long time. °· Avoid heavy lifting. °· Wear low-heeled shoes, and practice good posture. °· You may continue to have sex unless your health care provider directs you otherwise. °Relief of Pain or Discomfort °· Wear a good support bra for breast tenderness.   °· Take warm sitz baths to soothe any pain or discomfort caused by hemorrhoids. Use hemorrhoid cream if your health care provider approves.   °· Rest with your legs elevated if you have leg cramps or low back pain. °· If you develop varicose veins in your legs, wear support hose. Elevate your feet for 15 minutes, 3-4 times a day. Limit salt in your diet. °Prenatal Care °· Schedule your prenatal visits by the twelfth week of pregnancy. They are usually scheduled monthly at first, then more often in the last 2 months before delivery. °· Write down your questions. Take them to your prenatal visits. °· Keep all your prenatal visits as directed by your health care provider. °Safety °· Wear your seat belt at all times when driving. °· Make a list of emergency phone numbers, including numbers for family, friends, the hospital, and police and fire departments. °General Tips °· Ask your  health care provider for a referral to a local prenatal education class. Begin classes no later than at the beginning of month 6 of your pregnancy. °· Ask for help if you have counseling or nutritional needs during pregnancy. Your   health care provider can offer advice or refer you to specialists for help with various needs. °· Do not use hot tubs, steam rooms, or saunas. °· Do not douche or use tampons or scented sanitary pads. °· Do not cross your legs for long periods of time. °· Avoid cat litter boxes and soil used by cats. These carry germs that can cause birth defects in the baby and possibly loss of the fetus by miscarriage or stillbirth. °· Avoid all smoking, herbs, alcohol, and medicines not prescribed by your health care provider. Chemicals in these affect the formation and growth of the baby. °· Schedule a dentist appointment. At home, brush your teeth with a soft toothbrush and be gentle when you floss. °SEEK MEDICAL CARE IF:  °· You have dizziness. °· You have mild pelvic cramps, pelvic pressure, or nagging pain in the abdominal area. °· You have persistent nausea, vomiting, or diarrhea. °· You have a bad smelling vaginal discharge. °· You have pain with urination. °· You notice increased swelling in your face, hands, legs, or ankles. °SEEK IMMEDIATE MEDICAL CARE IF:  °· You have a fever. °· You are leaking fluid from your vagina. °· You have spotting or bleeding from your vagina. °· You have severe abdominal cramping or pain. °· You have rapid weight gain or loss. °· You vomit blood or material that looks like coffee grounds. °· You are exposed to German measles and have never had them. °· You are exposed to fifth disease or chickenpox. °· You develop a severe headache. °· You have shortness of breath. °· You have any kind of trauma, such as from a fall or a car accident. °Document Released: 01/16/2001 Document Revised: 06/08/2013 Document Reviewed: 12/02/2012 °Exit[employer]® Patient Information ©2015  [employer]. This information is not intended to replace advice given to you by your health care provider. Make sure you discuss any questions you have with your health care provider. ° °

## 2014-04-09 NOTE — MAU Note (Signed)
Pt reports positive home preg test, states she started having lower abd cramping and lower back pain for the last week. Started having vaginal bleeding last week. The bleeding lasted about 3 days but the cramping and the back pain has continued.

## 2014-04-10 LAB — HIV ANTIBODY (ROUTINE TESTING W REFLEX): HIV Screen 4th Generation wRfx: NONREACTIVE

## 2014-05-21 ENCOUNTER — Emergency Department (HOSPITAL_COMMUNITY)
Admission: EM | Admit: 2014-05-21 | Discharge: 2014-05-21 | Disposition: A | Discharge status: Home / Self Care | Payer: Medicaid Other | Attending: Emergency Medicine | Admitting: Emergency Medicine

## 2014-05-21 ENCOUNTER — Encounter (HOSPITAL_COMMUNITY): Payer: Self-pay

## 2014-05-21 DIAGNOSIS — H6501 Acute serous otitis media, right ear: Secondary | ICD-10-CM | POA: Insufficient documentation

## 2014-05-21 DIAGNOSIS — Z8659 Personal history of other mental and behavioral disorders: Secondary | ICD-10-CM | POA: Insufficient documentation

## 2014-05-21 DIAGNOSIS — H9203 Otalgia, bilateral: Secondary | ICD-10-CM

## 2014-05-21 MED ORDER — AMOXICILLIN 500 MG PO CAPS: 500.0000 mg | ORAL_CAPSULE | Freq: Three times a day (TID) | ORAL | Status: DC

## 2014-05-21 MED ORDER — AMOXICILLIN 500 MG PO CAPS
500.0000 mg | ORAL_CAPSULE | Freq: Once | ORAL | Status: AC
  Administered 2014-05-21: 500 mg via ORAL
  Filled 2014-05-21: qty 1

## 2014-05-21 NOTE — Discharge Instructions (Signed)
Take the prescribed medication as directed. Follow-up with ENT if no improvement in the next few days. Return to the ED for new or worsening symptoms.

## 2014-05-21 NOTE — ED Notes (Signed)
Pt complains of right ear pain x 2 days. Pt also states that she has had cold sx(cough, runny nose, sore throat)

## 2014-05-21 NOTE — ED Provider Notes (Signed)
CSN: [id number]     Arrival date & time 05/21/14  0443 History   First MD Initiated Contact with Patient 05/21/14 0600     Chief Complaint  Patient presents with  . Otalgia     (Consider location/radiation/quality/duration/timing/severity/associated sxs/prior Treatment) Patient is a 24 y.o. female presenting with ear pain. The history is provided by the patient and medical records.  Otalgia   24 year old female with right ear pain for the past 2 days. She also notes dry cough, runny nose, and sore throat. She has had sick contacts at home with similar symptoms. States throbbing pain in her right ear is her most severe symptom. States this morning she began having some mild pain in her left ear as well. No fever or chills. No chest pain or shortness of breath.  Past Medical History  Diagnosis Date  . Medical history non-contributory   . Depression   . Abnormal Pap smear    Past Surgical History  Procedure Laterality Date  . No past surgeries     Family History  Problem Relation Age of Onset  . Hypertension Father   . Heart disease Father   . Cancer Maternal Aunt   . Cancer Maternal Uncle   . Hypertension Paternal Grandmother    History  Substance Use Topics  . Smoking status: Never Smoker   . Smokeless tobacco: Never Used  . Alcohol Use: No   OB History    Gravida Para Term Preterm AB TAB SAB Ectopic Multiple Living   2 1 1  0 0 0 0 0 0 1     Review of Systems  HENT: Positive for ear pain.   All other systems reviewed and are negative.     Allergies  Review of patient's allergies indicates no known allergies.  Home Medications   Prior to Admission medications   Medication Sig Start Date End Date Taking? Authorizing Provider  promethazine (PHENERGAN) 25 MG tablet Take 0.5-1 tablets (12.5-25 mg total) by mouth every 6 (six) hours as needed. Patient not taking: Reported on 05/21/2014 04/09/14   Armando ReichertHeather D Hogan, CNM  terconazole (TERAZOL 7) 0.4 % vaginal cream Place  1 applicator vaginally at bedtime. For 7 nights Patient not taking: Reported on 05/21/2014 04/09/14   Armando ReichertHeather D Hogan, CNM   BP 124/83 mmHg  Pulse 89  Temp(Src) 98 F (36.7 C) (Oral)  Resp 16  SpO2 100%  LMP 05/21/2014  Breastfeeding? Unknown   Physical Exam  Constitutional: She is oriented to person, place, and time. She appears well-developed and well-nourished. No distress.  HENT:  Head: Normocephalic and atraumatic.  Right Ear: There is tenderness. Tympanic membrane is bulging.  Left Ear: Tympanic membrane normal.  Nose: Nose normal.  Mouth/Throat: Uvula is midline, oropharynx is clear and moist and mucous membranes are normal.  Bilateral EAC's erythematous and mildly swollen, R > L; right TM bulging without perforation, left TM normal; hearing WNL; no mastoid tenderness  Eyes: Conjunctivae and EOM are normal. Pupils are equal, round, and reactive to light.  Neck: Normal range of motion. Neck supple.  Cardiovascular: Normal rate, regular rhythm and normal heart sounds.   Pulmonary/Chest: Effort normal and breath sounds normal. No respiratory distress. She has no wheezes.  Musculoskeletal: Normal range of motion. She exhibits no edema.  Neurological: She is alert and oriented to person, place, and time.  Skin: Skin is warm and dry. She is not diaphoretic.  Psychiatric: She has a normal mood and affect.  Nursing note and vitals reviewed.  ED Course  Procedures (including critical care time) Labs Review Labs Reviewed - No data to display  Imaging Review No results found.   EKG Interpretation None      MDM   Final diagnoses:  Right acute serous otitis media, recurrence not specified  Otalgia of both ears   24 year old female with right ear pain. She her appears to have an uncomplicated otitis media on right and possibly developing otitis media on left. No clinical signs of strep throat.  Lungs clear without wheezes or rhonchi to suggest CAP.  Will start on  amoxicillin, first dose given here.  FU with ENT if no improvement in the next few days.  Discussed plan with patient, he/she acknowledged understanding and agreed with plan of care.  Return precautions given for new or worsening symptoms.  Garlon Hatchet, PA-C 05/21/14 1610  Arby Barrette, MD 05/21/14 [phone]

## 2014-07-25 ENCOUNTER — Inpatient Hospital Stay (HOSPITAL_COMMUNITY)
Admission: AD | Admit: 2014-07-25 | Discharge: 2014-07-26 | Disposition: A | Discharge status: Home / Self Care | Payer: Medicaid Other | Source: Ambulatory Visit | Attending: Obstetrics & Gynecology | Admitting: Obstetrics & Gynecology

## 2014-07-25 DIAGNOSIS — O26851 Spotting complicating pregnancy, first trimester: Secondary | ICD-10-CM | POA: Diagnosis not present

## 2014-07-25 DIAGNOSIS — O26899 Other specified pregnancy related conditions, unspecified trimester: Secondary | ICD-10-CM

## 2014-07-25 DIAGNOSIS — O209 Hemorrhage in early pregnancy, unspecified: Secondary | ICD-10-CM | POA: Diagnosis not present

## 2014-07-25 DIAGNOSIS — R109 Unspecified abdominal pain: Secondary | ICD-10-CM | POA: Diagnosis present

## 2014-07-25 DIAGNOSIS — Z3A01 Less than 8 weeks gestation of pregnancy: Secondary | ICD-10-CM | POA: Insufficient documentation

## 2014-07-25 NOTE — MAU Note (Signed)
Positive pregnancy test at home today. Unsure of last period; either in April or May. Abdominal cramping x 2 weeks; concerned when she found out pregnant. Vaginal bleeding since this morning; was leaking on to pad, has decreased since then.

## 2014-07-26 ENCOUNTER — Encounter (HOSPITAL_COMMUNITY): Payer: Self-pay | Admitting: *Deleted

## 2014-07-26 ENCOUNTER — Inpatient Hospital Stay (HOSPITAL_COMMUNITY): Payer: Medicaid Other

## 2014-07-26 DIAGNOSIS — O209 Hemorrhage in early pregnancy, unspecified: Secondary | ICD-10-CM | POA: Diagnosis not present

## 2014-07-26 LAB — URINALYSIS, ROUTINE W REFLEX MICROSCOPIC
Bilirubin Urine: NEGATIVE
Glucose, UA: NEGATIVE mg/dL
HGB URINE DIPSTICK: NEGATIVE
Ketones, ur: NEGATIVE mg/dL
Leukocytes, UA: NEGATIVE
Nitrite: NEGATIVE
PROTEIN: NEGATIVE mg/dL
Specific Gravity, Urine: 1.015 (ref 1.005–1.030)
Urobilinogen, UA: 1 mg/dL (ref 0.0–1.0)
pH: 7.5 (ref 5.0–8.0)

## 2014-07-26 LAB — CBC WITH DIFFERENTIAL/PLATELET
Basophils Absolute: 0 10*3/uL (ref 0.0–0.1)
Basophils Relative: 1 % (ref 0–1)
Eosinophils Absolute: 0.3 10*3/uL (ref 0.0–0.7)
Eosinophils Relative: 4 % (ref 0–5)
HCT: 35.7 % — ABNORMAL LOW (ref 36.0–46.0)
Hemoglobin: 12 g/dL (ref 12.0–15.0)
LYMPHS ABS: 2.5 10*3/uL (ref 0.7–4.0)
LYMPHS PCT: 39 % (ref 12–46)
MCH: 28.8 pg (ref 26.0–34.0)
MCHC: 33.6 g/dL (ref 30.0–36.0)
MCV: 85.6 fL (ref 78.0–100.0)
Monocytes Absolute: 0.6 10*3/uL (ref 0.1–1.0)
Monocytes Relative: 9 % (ref 3–12)
NEUTROS ABS: 3.1 10*3/uL (ref 1.7–7.7)
NEUTROS PCT: 47 % (ref 43–77)
PLATELETS: 238 10*3/uL (ref 150–400)
RBC: 4.17 MIL/uL (ref 3.87–5.11)
RDW: 12.8 % (ref 11.5–15.5)
WBC: 6.5 10*3/uL (ref 4.0–10.5)

## 2014-07-26 LAB — WET PREP, GENITAL
CLUE CELLS WET PREP: NONE SEEN
TRICH WET PREP: NONE SEEN
YEAST WET PREP: NONE SEEN

## 2014-07-26 LAB — POCT PREGNANCY, URINE: PREG TEST UR: POSITIVE — AB

## 2014-07-26 LAB — HIV ANTIBODY (ROUTINE TESTING W REFLEX): HIV Screen 4th Generation wRfx: NONREACTIVE

## 2014-07-26 LAB — HCG, QUANTITATIVE, PREGNANCY: hCG, Beta Chain, Quant, S: 5295 m[IU]/mL — ABNORMAL HIGH (ref <5)

## 2014-07-26 LAB — RPR: RPR: NONREACTIVE

## 2014-07-26 NOTE — Discharge Instructions (Signed)
First Trimester of Pregnancy The first trimester of pregnancy is from week 1 until the end of week 12 (months 1 through 3). During this time, your baby will begin to develop inside you. At 6-8 weeks, the eyes and face are formed, and the heartbeat can be seen on ultrasound. At the end of 12 weeks, all the baby's organs are formed. Prenatal care is all the medical care you receive before the birth of your baby. Make sure you get good prenatal care and follow all of your doctor's instructions. HOME CARE  Medicines  Take medicine only as told by your doctor. Some medicines are safe and some are not during pregnancy.  Take your prenatal vitamins as told by your doctor.  Take medicine that helps you poop (stool softener) as needed if your doctor says it is okay. Diet  Eat regular, healthy meals.  Your doctor will tell you the amount of weight gain that is right for you.  Avoid raw meat and uncooked cheese.  If you feel sick to your stomach (nauseous) or throw up (vomit):  Eat 4 or 5 small meals a day instead of 3 large meals.  Try eating a few soda crackers.  Drink liquids between meals instead of during meals.  If you have a hard time pooping (constipation):  Eat high-fiber foods like fresh vegetables, fruit, and whole grains.  Drink enough fluids to keep your pee (urine) clear or pale yellow. Activity and Exercise  Exercise only as told by your doctor. Stop exercising if you have cramps or pain in your lower belly (abdomen) or low back.  Try to avoid standing for long periods of time. Move your legs often if you must stand in one place for a long time.  Avoid heavy lifting.  Wear low-heeled shoes. Sit and stand up straight.  You can have sex unless your doctor tells you not to. Relief of Pain or Discomfort  Wear a good support bra if your breasts are sore.  Take warm water baths (sitz baths) to soothe pain or discomfort caused by hemorrhoids. Use hemorrhoid cream if your  doctor says it is okay.  Rest with your legs raised if you have leg cramps or low back pain.  Wear support hose if you have puffy, bulging veins (varicose veins) in your legs. Raise (elevate) your feet for 15 minutes, 3-4 times a day. Limit salt in your diet. Prenatal Care  Schedule your prenatal visits by the twelfth week of pregnancy.  Write down your questions. Take them to your prenatal visits.  Keep all your prenatal visits as told by your doctor. Safety  Wear your seat belt at all times when driving.  Make a list of emergency phone numbers. The list should include numbers for family, friends, the hospital, and police and fire departments. General Tips  Ask your doctor for a referral to a local prenatal class. Begin classes no later than at the start of month 6 of your pregnancy.  Ask for help if you need counseling or help with nutrition. Your doctor can give you advice or tell you where to go for help.  Do not use hot tubs, steam rooms, or saunas.  Do not douche or use tampons or scented sanitary pads.  Do not cross your legs for long periods of time.  Avoid litter boxes and soil used by cats.  Avoid all smoking, herbs, and alcohol. Avoid drugs not approved by your doctor.  Visit your dentist. At home, brush your teeth   with a soft toothbrush. Be gentle when you floss. GET HELP IF:  You are dizzy.  You have mild cramps or pressure in your lower belly.  You have a nagging pain in your belly area.  You continue to feel sick to your stomach, throw up, or have watery poop (diarrhea).  You have a bad smelling fluid coming from your vagina.  You have pain with peeing (urination).  You have increased puffiness (swelling) in your face, hands, legs, or ankles. GET HELP RIGHT AWAY IF:   You have a fever.  You are leaking fluid from your vagina.  You have spotting or bleeding from your vagina.  You have very bad belly cramping or pain.  You gain or lose weight  rapidly.  You throw up blood. It may look like coffee grounds.  You are around people who have German measles, fifth disease, or chickenpox.  You have a very bad headache.  You have shortness of breath.  You have any kind of trauma, such as from a fall or a car accident. Document Released: 07/11/2007 Document Revised: 06/08/2013 Document Reviewed: 12/02/2012 Exit[employer] Patient Information 2015 [employer]. This information is not intended to replace advice given to you by your health care provider. Make sure you discuss any questions you have with your health care provider.  

## 2014-07-26 NOTE — MAU Provider Note (Signed)
History     CSN: [id number]  Arrival date and time: 07/25/14 2346   First Provider Initiated Contact with Patient 07/26/14 0011      Chief Complaint  Patient presents with  . Possible Pregnancy  . Abdominal Cramping  . Vaginal Bleeding   HPI  [name] is a 24 y.o. G3P1011 at [redacted]w[redacted]d by unsure LMP who presents to MAU today with complaint of abdominal cramping and spotting. The patient states cramping rated at 7/10 now. No pain medication taken. Cramping is in the lower abdomen and has been intermittent x 2 weeks. She states bleeding noted earlier today which has resolved. She denies fever, N/V/D or UTI symptoms. Patient had TAB in March with last pregnancy. Plans to get prenatal care for this pregnancy at [facility].   OB History    Gravida Para Term Preterm AB TAB SAB Ectopic Multiple Living   3 1 1  0 1 1 0 0 0 1      Past Medical History  Diagnosis Date  . Medical history non-contributory   . Depression   . Abnormal Pap smear     Past Surgical History  Procedure Laterality Date  . No past surgeries      Family History  Problem Relation Age of Onset  . Hypertension Father   . Heart disease Father   . Cancer Maternal Aunt   . Cancer Maternal Uncle   . Hypertension Paternal Grandmother     History  Substance Use Topics  . Smoking status: Never Smoker   . Smokeless tobacco: Never Used  . Alcohol Use: No    Allergies: No Known Allergies  Prescriptions prior to admission  Medication Sig Dispense Refill Last Dose  . amoxicillin (AMOXIL) 500 MG capsule Take 1 capsule (500 mg total) by mouth 3 (three) times daily. 30 capsule 0   . promethazine (PHENERGAN) 25 MG tablet Take 0.5-1 tablets (12.5-25 mg total) by mouth every 6 (six) hours as needed. (Patient not taking: Reported on 05/21/2014) 30 tablet 0   . terconazole (TERAZOL 7) 0.4 % vaginal cream Place 1 applicator vaginally at bedtime. For 7 nights (Patient not taking: Reported on 05/21/2014) 45 g 0      Review of Systems  Constitutional: Negative for fever and malaise/fatigue.  Gastrointestinal: Positive for abdominal pain. Negative for nausea, vomiting, diarrhea and constipation.  Genitourinary: Negative for dysuria, urgency and frequency.       + vaginal bleeding Neg - vaginal discharge   Physical Exam   Blood pressure 119/71, pulse 71, temperature 98.7 F (37.1 C), temperature source Oral, resp. rate 16, height 5\' 4"  (1.626 m), weight 171 lb 3.2 oz (77.656 kg), last menstrual period 06/06/2014, SpO2 100 %, not currently breastfeeding.  Physical Exam  Nursing note and vitals reviewed. Constitutional: She is oriented to person, place, and time. She appears well-developed and well-nourished. No distress.  HENT:  Head: Normocephalic and atraumatic.  Cardiovascular: Normal rate.   Respiratory: Effort normal.  GI: Soft. She exhibits no distension and no mass. There is no tenderness. There is no rebound and no guarding.  Genitourinary: Uterus is enlarged (slightly ). Uterus is not tender. Cervix exhibits no motion tenderness, no discharge and no friability. Right adnexum displays no mass and no tenderness. Left adnexum displays no mass and no tenderness. No bleeding in the vagina. No vaginal discharge found.  Neurological: She is alert and oriented to person, place, and time.  Skin: Skin is warm and dry. No erythema.  Psychiatric: She  has a normal mood and affect.   Results for orders placed or performed during the hospital encounter of 07/25/14 (from the past 24 hour(s))  Urinalysis, Routine w reflex microscopic (not at [facility])     Status: None   Collection Time: 07/25/14 11:54 PM  Result Value Ref Range   Color, Urine YELLOW YELLOW   APPearance CLEAR CLEAR   Specific Gravity, Urine 1.015 1.005 - 1.030   pH 7.5 5.0 - 8.0   Glucose, UA NEGATIVE NEGATIVE mg/dL   Hgb urine dipstick NEGATIVE NEGATIVE   Bilirubin Urine NEGATIVE NEGATIVE   Ketones, ur NEGATIVE NEGATIVE mg/dL    Protein, ur NEGATIVE NEGATIVE mg/dL   Urobilinogen, UA 1.0 0.0 - 1.0 mg/dL   Nitrite NEGATIVE NEGATIVE   Leukocytes, UA NEGATIVE NEGATIVE  Pregnancy, urine POC     Status: Abnormal   Collection Time: 07/26/14 12:07 AM  Result Value Ref Range   Preg Test, Ur POSITIVE (A) NEGATIVE  Wet prep, genital     Status: Abnormal   Collection Time: 07/26/14 12:19 AM  Result Value Ref Range   Yeast Wet Prep HPF POC NONE SEEN NONE SEEN   Trich, Wet Prep NONE SEEN NONE SEEN   Clue Cells Wet Prep HPF POC NONE SEEN NONE SEEN   WBC, Wet Prep HPF POC FEW (A) NONE SEEN  CBC with Differential/Platelet     Status: Abnormal   Collection Time: 07/26/14 12:32 AM  Result Value Ref Range   WBC 6.5 4.0 - 10.5 K/uL   RBC 4.17 3.87 - 5.11 MIL/uL   Hemoglobin 12.0 12.0 - 15.0 g/dL   HCT 16.1 (L) 09.6 - 04.5 %   MCV 85.6 78.0 - 100.0 fL   MCH 28.8 26.0 - 34.0 pg   MCHC 33.6 30.0 - 36.0 g/dL   RDW 40.9 81.1 - 91.4 %   Platelets 238 150 - 400 K/uL   Neutrophils Relative % 47 43 - 77 %   Neutro Abs 3.1 1.7 - 7.7 K/uL   Lymphocytes Relative 39 12 - 46 %   Lymphs Abs 2.5 0.7 - 4.0 K/uL   Monocytes Relative 9 3 - 12 %   Monocytes Absolute 0.6 0.1 - 1.0 K/uL   Eosinophils Relative 4 0 - 5 %   Eosinophils Absolute 0.3 0.0 - 0.7 K/uL   Basophils Relative 1 0 - 1 %   Basophils Absolute 0.0 0.0 - 0.1 K/uL  hCG, quantitative, pregnancy     Status: Abnormal   Collection Time: 07/26/14 12:32 AM  Result Value Ref Range   hCG, Beta Chain, Quant, S 5295 (H) <5 mIU/mL   US Ob Comp Less 14 Wks  07/26/2014   CLINICAL DATA:  Patient unsure last menstrual period. Cramping for 2 weeks.  EXAM: OBSTETRIC <14 WK [place] AND TRANSVAGINAL OB US  TECHNIQUE: Both transabdominal and transvaginal ultrasound examinations were performed for complete evaluation of the gestation as well as the maternal uterus, adnexal regions, and pelvic cul-de-sac. Transvaginal technique was performed to assess early pregnancy.  COMPARISON:  Pelvic ultrasound  04/09/2014  FINDINGS: Intrauterine gestational sac: Visualized/normal in shape.  Yolk sac:  Present  Embryo:  Not present  Cardiac Activity: Not present  MSD: 7.5  mm   5 w   3  d  Maternal uterus/adnexae: No subchorionic hemorrhage. Small corpus luteum within the left ovary. Right ovary is grossly unremarkable no significant pelvic free fluid.  IMPRESSION: Probable early intrauterine gestational sac and no yolk sac but no fetal pole or  cardiac activity yet visualized. Recommend follow-up quantitative B-HCG levels and follow-up US in 14 days to confirm and assess viability. This recommendation follows SRU consensus guidelines: Diagnostic Criteria for Nonviable Pregnancy Early in the First Trimester. Malva Limes Med 2013; 811:9147-82.   Electronically Signed   By: Annia Belt M.D.   On: 07/26/2014 01:15   US Ob Transvaginal  07/26/2014   CLINICAL DATA:  Patient unsure last menstrual period. Cramping for 2 weeks.  EXAM: OBSTETRIC <14 WK [place] AND TRANSVAGINAL OB US  TECHNIQUE: Both transabdominal and transvaginal ultrasound examinations were performed for complete evaluation of the gestation as well as the maternal uterus, adnexal regions, and pelvic cul-de-sac. Transvaginal technique was performed to assess early pregnancy.  COMPARISON:  Pelvic ultrasound 04/09/2014  FINDINGS: Intrauterine gestational sac: Visualized/normal in shape.  Yolk sac:  Present  Embryo:  Not present  Cardiac Activity: Not present  MSD: 7.5  mm   5 w   3  d  Maternal uterus/adnexae: No subchorionic hemorrhage. Small corpus luteum within the left ovary. Right ovary is grossly unremarkable no significant pelvic free fluid.  IMPRESSION: Probable early intrauterine gestational sac and no yolk sac but no fetal pole or cardiac activity yet visualized. Recommend follow-up quantitative B-HCG levels and follow-up US in 14 days to confirm and assess viability. This recommendation follows SRU consensus guidelines: Diagnostic Criteria for Nonviable  Pregnancy Early in the First Trimester. Malva Limes Med 2013; 956:2130-86.   Electronically Signed   By: Annia Belt M.D.   On: 07/26/2014 01:15    MAU Course  Procedures None  MDM +UPT UA, wet prep, GC/chlamydia, CBC, quant hCG, HIV, RPR and [place] today to rule out ectopic pregnancy Discussed [place] results with Dr. Earlene Plater in Radiology, confirmed yolk sac present. Will addend [place] report.  Assessment and Plan  A: IUGS and YS at [redacted]w[redacted]d Abdominal pain in pregnancy Spotting in early pregnancy  P: Discharge home First trimester and bleeding precautions discussed Patient advised to follow-up with [facility] as planned to start prenatal care Patient may return to MAU as needed or if her condition were to change or worsen   Marny Lowenstein, PA-C  07/26/2014, 1:31 AM

## 2014-07-27 LAB — GC/CHLAMYDIA PROBE AMP (~~LOC~~) NOT AT ARMC
Chlamydia: NEGATIVE
Neisseria Gonorrhea: NEGATIVE

## 2014-11-16 ENCOUNTER — Emergency Department (HOSPITAL_COMMUNITY)
Admission: EM | Admit: 2014-11-16 | Discharge: 2014-11-16 | Disposition: A | Discharge status: Home / Self Care | Payer: No Typology Code available for payment source | Attending: Emergency Medicine | Admitting: Emergency Medicine

## 2014-11-16 ENCOUNTER — Encounter (HOSPITAL_COMMUNITY): Payer: Self-pay | Admitting: Emergency Medicine

## 2014-11-16 DIAGNOSIS — Z8659 Personal history of other mental and behavioral disorders: Secondary | ICD-10-CM | POA: Diagnosis not present

## 2014-11-16 DIAGNOSIS — M545 Low back pain, unspecified: Secondary | ICD-10-CM

## 2014-11-16 DIAGNOSIS — S79922A Unspecified injury of left thigh, initial encounter: Secondary | ICD-10-CM | POA: Insufficient documentation

## 2014-11-16 DIAGNOSIS — Y9389 Activity, other specified: Secondary | ICD-10-CM | POA: Diagnosis not present

## 2014-11-16 DIAGNOSIS — S3992XA Unspecified injury of lower back, initial encounter: Secondary | ICD-10-CM | POA: Diagnosis not present

## 2014-11-16 DIAGNOSIS — Y999 Unspecified external cause status: Secondary | ICD-10-CM | POA: Diagnosis not present

## 2014-11-16 DIAGNOSIS — S199XXA Unspecified injury of neck, initial encounter: Secondary | ICD-10-CM | POA: Diagnosis not present

## 2014-11-16 DIAGNOSIS — Y92410 Unspecified street and highway as the place of occurrence of the external cause: Secondary | ICD-10-CM | POA: Insufficient documentation

## 2014-11-16 MED ORDER — IBUPROFEN 800 MG PO TABS
800.0000 mg | ORAL_TABLET | Freq: Three times a day (TID) | ORAL | Status: DC | PRN
Start: 2014-11-16 — End: 2014-11-19

## 2014-11-16 MED ORDER — CYCLOBENZAPRINE HCL 10 MG PO TABS: 10.0000 mg | ORAL_TABLET | Freq: Three times a day (TID) | ORAL | Status: DC | PRN
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
## 2014-11-16 NOTE — ED Notes (Signed)
Pt walked out before receiving discharge papers.

## 2014-11-18 ENCOUNTER — Emergency Department (HOSPITAL_COMMUNITY): Payer: No Typology Code available for payment source

## 2014-11-18 ENCOUNTER — Encounter (HOSPITAL_COMMUNITY): Payer: Self-pay | Admitting: Emergency Medicine

## 2014-11-18 ENCOUNTER — Emergency Department (HOSPITAL_COMMUNITY)
Admission: EM | Admit: 2014-11-18 | Discharge: 2014-11-19 | Disposition: A | Discharge status: Home / Self Care | Payer: No Typology Code available for payment source | Attending: Emergency Medicine | Admitting: Emergency Medicine

## 2014-11-18 DIAGNOSIS — Y998 Other external cause status: Secondary | ICD-10-CM | POA: Insufficient documentation

## 2014-11-18 DIAGNOSIS — T07XXXA Unspecified multiple injuries, initial encounter: Secondary | ICD-10-CM

## 2014-11-18 DIAGNOSIS — T148 Other injury of unspecified body region: Secondary | ICD-10-CM | POA: Diagnosis not present

## 2014-11-18 DIAGNOSIS — S199XXA Unspecified injury of neck, initial encounter: Secondary | ICD-10-CM | POA: Diagnosis not present

## 2014-11-18 DIAGNOSIS — S3992XA Unspecified injury of lower back, initial encounter: Secondary | ICD-10-CM | POA: Diagnosis present

## 2014-11-18 DIAGNOSIS — Z8659 Personal history of other mental and behavioral disorders: Secondary | ICD-10-CM | POA: Insufficient documentation

## 2014-11-18 DIAGNOSIS — T148XXA Other injury of unspecified body region, initial encounter: Secondary | ICD-10-CM

## 2014-11-18 DIAGNOSIS — Y92410 Unspecified street and highway as the place of occurrence of the external cause: Secondary | ICD-10-CM | POA: Diagnosis not present

## 2014-11-18 DIAGNOSIS — Y9389 Activity, other specified: Secondary | ICD-10-CM | POA: Insufficient documentation
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
## 2014-11-18 NOTE — ED Notes (Signed)
Went into pt room and pt was speaking with dr. Rock NephewPt arguing with dr and as soon as EDP left pt stated that "you all are crazy"
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
## 2014-11-19 ENCOUNTER — Emergency Department (HOSPITAL_COMMUNITY): Payer: No Typology Code available for payment source

## 2014-11-19 MED ORDER — IBUPROFEN 800 MG PO TABS: 800.0000 mg | ORAL_TABLET | Freq: Three times a day (TID) | ORAL | Status: DC

## 2014-11-19 MED ORDER — METHOCARBAMOL 500 MG PO TABS: 500.0000 mg | ORAL_TABLET | Freq: Three times a day (TID) | ORAL | Status: DC | PRN

## 2014-11-19 NOTE — ED Notes (Signed)
EDP at bedside  

## 2014-11-19 NOTE — Discharge Instructions (Signed)

## 2015-02-11 ENCOUNTER — Emergency Department (HOSPITAL_COMMUNITY)
Admission: EM | Admit: 2015-02-11 | Discharge: 2015-02-11 | Disposition: A | Discharge status: Home / Self Care | Payer: No Typology Code available for payment source | Attending: Physician Assistant | Admitting: Physician Assistant

## 2015-02-11 ENCOUNTER — Encounter (HOSPITAL_COMMUNITY): Payer: Self-pay | Admitting: Emergency Medicine

## 2015-02-11 DIAGNOSIS — Z8659 Personal history of other mental and behavioral disorders: Secondary | ICD-10-CM | POA: Insufficient documentation

## 2015-02-11 DIAGNOSIS — S199XXA Unspecified injury of neck, initial encounter: Secondary | ICD-10-CM | POA: Insufficient documentation

## 2015-02-11 DIAGNOSIS — S29002A Unspecified injury of muscle and tendon of back wall of thorax, initial encounter: Secondary | ICD-10-CM | POA: Insufficient documentation

## 2015-02-11 DIAGNOSIS — S59912A Unspecified injury of left forearm, initial encounter: Secondary | ICD-10-CM | POA: Insufficient documentation

## 2015-02-11 DIAGNOSIS — Y92481 Parking lot as the place of occurrence of the external cause: Secondary | ICD-10-CM | POA: Diagnosis not present

## 2015-02-11 DIAGNOSIS — Y9389 Activity, other specified: Secondary | ICD-10-CM | POA: Diagnosis not present

## 2015-02-11 DIAGNOSIS — Y998 Other external cause status: Secondary | ICD-10-CM | POA: Insufficient documentation

## 2015-02-11 DIAGNOSIS — Z791 Long term (current) use of non-steroidal anti-inflammatories (NSAID): Secondary | ICD-10-CM | POA: Insufficient documentation
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
## 2015-02-11 NOTE — Discharge Instructions (Signed)

## 2015-05-06 ENCOUNTER — Encounter (HOSPITAL_COMMUNITY): Payer: Self-pay | Admitting: Emergency Medicine

## 2015-05-06 ENCOUNTER — Emergency Department (HOSPITAL_COMMUNITY)
Admission: EM | Admit: 2015-05-06 | Discharge: 2015-05-06 | Disposition: A | Discharge status: Home / Self Care | Payer: No Typology Code available for payment source | Attending: Emergency Medicine | Admitting: Emergency Medicine

## 2015-05-06 DIAGNOSIS — S3992XA Unspecified injury of lower back, initial encounter: Secondary | ICD-10-CM | POA: Diagnosis not present

## 2015-05-06 DIAGNOSIS — Y92410 Unspecified street and highway as the place of occurrence of the external cause: Secondary | ICD-10-CM | POA: Diagnosis not present

## 2015-05-06 DIAGNOSIS — Y9389 Activity, other specified: Secondary | ICD-10-CM | POA: Diagnosis not present

## 2015-05-06 DIAGNOSIS — S199XXA Unspecified injury of neck, initial encounter: Secondary | ICD-10-CM | POA: Diagnosis present

## 2015-05-06 DIAGNOSIS — Z8659 Personal history of other mental and behavioral disorders: Secondary | ICD-10-CM | POA: Insufficient documentation

## 2015-05-06 DIAGNOSIS — Z041 Encounter for examination and observation following transport accident: Secondary | ICD-10-CM

## 2015-05-06 DIAGNOSIS — Z043 Encounter for examination and observation following other accident: Secondary | ICD-10-CM

## 2015-05-06 DIAGNOSIS — Y998 Other external cause status: Secondary | ICD-10-CM | POA: Diagnosis not present

## 2015-05-06 MED ORDER — IBUPROFEN 800 MG PO TABS: 800.0000 mg | ORAL_TABLET | Freq: Three times a day (TID) | ORAL | Status: DC

## 2015-05-06 MED ORDER — METHOCARBAMOL 500 MG PO TABS: 500.0000 mg | ORAL_TABLET | Freq: Two times a day (BID) | ORAL | Status: DC
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
## 2015-05-07 ENCOUNTER — Encounter (HOSPITAL_COMMUNITY): Payer: Self-pay | Admitting: Emergency Medicine

## 2015-05-07 ENCOUNTER — Emergency Department (INDEPENDENT_AMBULATORY_CARE_PROVIDER_SITE_OTHER)
Admission: EM | Admit: 2015-05-07 | Discharge: 2015-05-07 | Disposition: A | Discharge status: Home / Self Care | Payer: Medicaid Other | Source: Home / Self Care | Attending: Emergency Medicine | Admitting: Emergency Medicine

## 2015-05-07 ENCOUNTER — Emergency Department (INDEPENDENT_AMBULATORY_CARE_PROVIDER_SITE_OTHER): Payer: Medicaid Other

## 2015-05-07 DIAGNOSIS — S8002XA Contusion of left knee, initial encounter: Secondary | ICD-10-CM

## 2015-05-07 MED ORDER — HYDROCODONE-ACETAMINOPHEN 5-325 MG PO TABS: 2.0000 | ORAL_TABLET | ORAL | Status: DC | PRN
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
## 2015-05-26 ENCOUNTER — Encounter (HOSPITAL_COMMUNITY): Payer: Self-pay

## 2015-05-26 ENCOUNTER — Emergency Department (HOSPITAL_COMMUNITY)
Admission: EM | Admit: 2015-05-26 | Discharge: 2015-05-26 | Disposition: A | Discharge status: Home / Self Care | Payer: Medicaid Other | Attending: Emergency Medicine | Admitting: Emergency Medicine

## 2015-05-26 DIAGNOSIS — S8992XA Unspecified injury of left lower leg, initial encounter: Secondary | ICD-10-CM | POA: Diagnosis present

## 2015-05-26 DIAGNOSIS — Z79899 Other long term (current) drug therapy: Secondary | ICD-10-CM | POA: Diagnosis not present

## 2015-05-26 DIAGNOSIS — Y998 Other external cause status: Secondary | ICD-10-CM | POA: Diagnosis not present

## 2015-05-26 DIAGNOSIS — Z8659 Personal history of other mental and behavioral disorders: Secondary | ICD-10-CM | POA: Insufficient documentation

## 2015-05-26 DIAGNOSIS — Z791 Long term (current) use of non-steroidal anti-inflammatories (NSAID): Secondary | ICD-10-CM | POA: Insufficient documentation

## 2015-05-26 DIAGNOSIS — M25562 Pain in left knee: Secondary | ICD-10-CM

## 2015-05-26 DIAGNOSIS — Y9389 Activity, other specified: Secondary | ICD-10-CM | POA: Insufficient documentation

## 2015-05-26 DIAGNOSIS — Y92410 Unspecified street and highway as the place of occurrence of the external cause: Secondary | ICD-10-CM | POA: Insufficient documentation

## 2015-05-26 MED ORDER — NAPROXEN 500 MG PO TABS: 500.0000 mg | ORAL_TABLET | Freq: Two times a day (BID) | ORAL | Status: DC

## 2015-05-26 MED ORDER — METHOCARBAMOL 500 MG PO TABS: 500.0000 mg | ORAL_TABLET | Freq: Four times a day (QID) | ORAL | Status: DC | PRN

## 2015-05-26 MED ORDER — HYDROCODONE-ACETAMINOPHEN 5-325 MG PO TABS: 1.0000 | ORAL_TABLET | ORAL | Status: DC | PRN

## 2015-05-26 NOTE — ED Notes (Signed)
MD at bedside. 

## 2015-05-26 NOTE — Discharge Instructions (Signed)
Knee Sprain A knee sprain is a tear in one of the strong, fibrous tissues that connect the bones (ligaments) in your knee. The severity of the sprain depends on how much of the ligament is torn. The tear can be either partial or complete. CAUSES  Often, sprains are a result of a fall or injury. The force of the impact causes the fibers of your ligament to stretch too much. This excess tension causes the fibers of your ligament to tear. SIGNS AND SYMPTOMS  You may have some loss of motion in your knee. Other symptoms include:  Bruising.  Pain in the knee area.  Tenderness of the knee to the touch.  Swelling. DIAGNOSIS  To diagnose a knee sprain, your health care provider will physically examine your knee. Your health care provider may also suggest an X-ray exam of your knee to make sure no bones are broken. TREATMENT  If your ligament is only partially torn, treatment usually involves keeping the knee in a fixed position (immobilization) or bracing your knee for activities that require movement for several weeks. To do this, your health care provider will apply a bandage, cast, or splint to keep your knee from moving and to support your knee during movement until it heals. For a partially torn ligament, the healing process usually takes 4-6 weeks. If your ligament is completely torn, depending on which ligament it is, you may need surgery to reconnect the ligament to the bone or reconstruct it. After surgery, a cast or splint may be applied and will need to stay on your knee for 4-6 weeks while your ligament heals. HOME CARE INSTRUCTIONS  Keep your injured knee elevated to decrease swelling.  To ease pain and swelling, apply ice to the injured area:  Put ice in a plastic bag.  Place a towel between your skin and the bag.  Leave the ice on for 20 minutes, 2-3 times a day.  Only take medicine for pain as directed by your health care provider.  Do not leave your knee unprotected until  pain and stiffness go away (usually 4-6 weeks).  If you have a cast or splint, do not allow it to get wet. If you have been instructed not to remove it, cover it with a plastic bag when you shower or bathe. Do not swim.  Your health care provider may suggest exercises for you to do during your recovery to prevent or limit permanent weakness and stiffness. SEEK IMMEDIATE MEDICAL CARE IF:  Your cast or splint becomes damaged.  Your pain becomes worse.  You have significant pain, swelling, or numbness below the cast or splint. MAKE SURE YOU:  Understand these instructions.  Will watch your condition.  Will get help right away if you are not doing well or get worse.   This information is not intended to replace advice given to you by your health care provider. Make sure you discuss any questions you have with your health care provider.   Document Released: 01/22/2005 Document Revised: 02/12/2014 Document Reviewed: 09/03/2012 Elsevier Interactive Patient Education 2016 [employer].  Naproxen and naproxen sodium oral immediate-release tablets What is this medicine? NAPROXEN (na PROX en) is a non-steroidal anti-inflammatory drug (NSAID). It is used to reduce swelling and to treat pain. This medicine may be used for dental pain, headache, or painful monthly periods. It is also used for painful joint and muscular problems such as arthritis, tendinitis, bursitis, and gout. This medicine may be used for other purposes; ask your  health care provider or pharmacist if you have questions. What should I tell my health care provider before I take this medicine? They need to know if you have any of these conditions: -asthma -cigarette smoker -drink more than 3 alcohol containing drinks a day -heart disease or circulation problems such as heart failure or leg edema (fluid retention) -high blood pressure -kidney disease -liver disease -stomach bleeding or ulcers -an unusual or allergic reaction  to naproxen, aspirin, other NSAIDs, other medicines, foods, dyes, or preservatives -pregnant or trying to get pregnant -breast-feeding How should I use this medicine? Take this medicine by mouth with a glass of water. Follow the directions on the prescription label. Take it with food if your stomach gets upset. Try to not lie down for at least 10 minutes after you take it. Take your medicine at regular intervals. Do not take your medicine more often than directed. Long-term, continuous use may increase the risk of heart attack or stroke. A special MedGuide will be given to you by the pharmacist with each prescription and refill. Be sure to read this information carefully each time. Talk to your pediatrician regarding the use of this medicine in children. Special care may be needed. Overdosage: If you think you have taken too much of this medicine contact a poison control center or emergency room at once. NOTE: This medicine is only for you. Do not share this medicine with others. What if I miss a dose? If you miss a dose, take it as soon as you can. If it is almost time for your next dose, take only that dose. Do not take double or extra doses. What may interact with this medicine? -alcohol -aspirin -cidofovir -diuretics -lithium -methotrexate -other drugs for inflammation like ketorolac or prednisone -pemetrexed -probenecid -warfarin This list may not describe all possible interactions. Give your health care provider a list of all the medicines, herbs, non-prescription drugs, or dietary supplements you use. Also tell them if you smoke, drink alcohol, or use illegal drugs. Some items may interact with your medicine. What should I watch for while using this medicine? Tell your doctor or health care professional if your pain does not get better. Talk to your doctor before taking another medicine for pain. Do not treat yourself. This medicine does not prevent heart attack or stroke. In fact,  this medicine may increase the chance of a heart attack or stroke. The chance may increase with longer use of this medicine and in people who have heart disease. If you take aspirin to prevent heart attack or stroke, talk with your doctor or health care professional. Do not take other medicines that contain aspirin, ibuprofen, or naproxen with this medicine. Side effects such as stomach upset, nausea, or ulcers may be more likely to occur. Many medicines available without a prescription should not be taken with this medicine. This medicine can cause ulcers and bleeding in the stomach and intestines at any time during treatment. Do not smoke cigarettes or drink alcohol. These increase irritation to your stomach and can make it more susceptible to damage from this medicine. Ulcers and bleeding can happen without warning symptoms and can cause death. You may get drowsy or dizzy. Do not drive, use machinery, or do anything that needs mental alertness until you know how this medicine affects you. Do not stand or sit up quickly, especially if you are an older patient. This reduces the risk of dizzy or fainting spells. This medicine can cause you to bleed more  easily. Try to avoid damage to your teeth and gums when you brush or floss your teeth. What side effects may I notice from receiving this medicine? Side effects that you should report to your doctor or health care professional as soon as possible: -black or bloody stools, blood in the urine or vomit -blurred vision -chest pain -difficulty breathing or wheezing -nausea or vomiting -severe stomach pain -skin rash, skin redness, blistering or peeling skin, hives, or itching -slurred speech or weakness on one side of the body -swelling of eyelids, throat, lips -unexplained weight gain or swelling -unusually weak or tired -yellowing of eyes or skin Side effects that usually do not require medical attention (report to your doctor or health care  professional if they continue or are bothersome): -constipation -headache -heartburn This list may not describe all possible side effects. Call your doctor for medical advice about side effects. You may report side effects to FDA at 1-800-FDA-1088. Where should I keep my medicine? Keep out of the reach of children. Store at room temperature between 15 and 30 degrees C (59 and 86 degrees F). Keep container tightly closed. Throw away any unused medicine after the expiration date. NOTE: This sheet is a summary. It may not cover all possible information. If you have questions about this medicine, talk to your doctor, pharmacist, or health care provider.    2016, Elsevier/Gold Standard. (2009-01-24 20:10:16)  Methocarbamol tablets What is this medicine? METHOCARBAMOL (meth oh KAR ba mole) helps to relieve pain and stiffness in muscles caused by strains, sprains, or other injury to your muscles. This medicine may be used for other purposes; ask your health care provider or pharmacist if you have questions. What should I tell my health care provider before I take this medicine? They need to know if you have any of these conditions: -kidney disease -seizures -an unusual or allergic reaction to methocarbamol, other medicines, foods, dyes, or preservatives -pregnant or trying to get pregnant -breast-feeding How should I use this medicine? Take this medicine by mouth with a full glass of water. Follow the directions on the prescription label. Take your medicine at regular intervals. Do not take your medicine more often than directed. Talk to your pediatrician regarding the use of this medicine in children. Special care may be needed. Overdosage: If you think you have taken too much of this medicine contact a poison control center or emergency room at once. NOTE: This medicine is only for you. Do not share this medicine with others. What if I miss a dose? If you miss a dose, take it as soon as you  can. If it is almost time for your next dose, take only the next dose. Do not take double or extra doses. What may interact with this medicine? -alcohol or medicines that contain alcohol -cholinesterase inhibitors like neostigmine, ambenonium, and pyridostigmine bromide -other medicines that cause drowsiness This list may not describe all possible interactions. Give your health care provider a list of all the medicines, herbs, non-prescription drugs, or dietary supplements you use. Also tell them if you smoke, drink alcohol, or use illegal drugs. Some items may interact with your medicine. What should I watch for while using this medicine? You may get drowsy or dizzy. Do not drive, use machinery, or do anything that needs mental alertness until you know how this medicine affects you. Do not stand or sit up quickly, especially if you are an older patient. This reduces the risk of dizzy or fainting spells. Alcohol may  interfere with the effect of this medicine. Avoid alcoholic drinks. What side effects may I notice from receiving this medicine? Side effects that you should report to your doctor or health care professional as soon as possible: -allergic reactions like skin rash, itching or hives, swelling of the face, lips, or tongue -blurred vision or changes in vision -confusion -fainting spells -fever -nausea or vomiting -seizures Side effects that usually do not require medical attention (report to your doctor or health care professional if they continue or are bothersome): -dizziness -drowsiness -headache -metallic taste This list may not describe all possible side effects. Call your doctor for medical advice about side effects. You may report side effects to FDA at 1-800-FDA-1088. Where should I keep my medicine? Keep out of the reach of children. Store at room temperature between 20 and 25 degrees C (68 and 77 degrees F). Keep container tightly closed. Throw away any unused medicine  after the expiration date. NOTE: This sheet is a summary. It may not cover all possible information. If you have questions about this medicine, talk to your doctor, pharmacist, or health care provider.    2016, Elsevier/Gold Standard. (2007-08-11 16:02:03)  Acetaminophen; Hydrocodone tablets or capsules What is this medicine? ACETAMINOPHEN; HYDROCODONE (a set a MEE noe fen; hye droe KOE done) is a pain reliever. It is used to treat moderate to severe pain. This medicine may be used for other purposes; ask your health care provider or pharmacist if you have questions. What should I tell my health care provider before I take this medicine? They need to know if you have any of these conditions: -brain tumor -Crohn's disease, inflammatory bowel disease, or ulcerative colitis -drug abuse or addiction -head injury -heart or circulation problems -if you often drink alcohol -kidney disease or problems going to the bathroom -liver disease -lung disease, asthma, or breathing problems -an unusual or allergic reaction to acetaminophen, hydrocodone, other opioid analgesics, other medicines, foods, dyes, or preservatives -pregnant or trying to get pregnant -breast-feeding How should I use this medicine? Take this medicine by mouth. Swallow it with a full glass of water. Follow the directions on the prescription label. If the medicine upsets your stomach, take the medicine with food or milk. Do not take more than you are told to take. Talk to your pediatrician regarding the use of this medicine in children. This medicine is not approved for use in children. Patients over 65 years may have a stronger reaction and need a smaller dose. Overdosage: If you think you have taken too much of this medicine contact a poison control center or emergency room at once. NOTE: This medicine is only for you. Do not share this medicine with others. What if I miss a dose? If you miss a dose, take it as soon as you can.  If it is almost time for your next dose, take only that dose. Do not take double or extra doses. What may interact with this medicine? -alcohol -antihistamines -isoniazid -medicines for depression, anxiety, or psychotic disturbances -medicines for sleep -muscle relaxants -naltrexone -narcotic medicines (opiates) for pain -phenobarbital -ritonavir -tramadol This list may not describe all possible interactions. Give your health care provider a list of all the medicines, herbs, non-prescription drugs, or dietary supplements you use. Also tell them if you smoke, drink alcohol, or use illegal drugs. Some items may interact with your medicine. What should I watch for while using this medicine? Tell your doctor or health care professional if your pain does not go  away, if it gets worse, or if you have new or a different type of pain. You may develop tolerance to the medicine. Tolerance means that you will need a higher dose of the medicine for pain relief. Tolerance is normal and is expected if you take the medicine for a long time. Do not suddenly stop taking your medicine because you may develop a severe reaction. Your body becomes used to the medicine. This does NOT mean you are addicted. Addiction is a behavior related to getting and using a drug for a non-medical reason. If you have pain, you have a medical reason to take pain medicine. Your doctor will tell you how much medicine to take. If your doctor wants you to stop the medicine, the dose will be slowly lowered over time to avoid any side effects. You may get drowsy or dizzy when you first start taking the medicine or change doses. Do not drive, use machinery, or do anything that may be dangerous until you know how the medicine affects you. Stand or sit up slowly. There are different types of narcotic medicines (opiates) for pain. If you take more than one type at the same time, you may have more side effects. Give your health care provider a  list of all medicines you use. Your doctor will tell you how much medicine to take. Do not take more medicine than directed. Call emergency for help if you have problems breathing. The medicine will cause constipation. Try to have a bowel movement at least every 2 to 3 days. If you do not have a bowel movement for 3 days, call your doctor or health care professional. Too much acetaminophen can be very dangerous. Do not take Tylenol (acetaminophen) or medicines that contain acetaminophen with this medicine. Many non-prescription medicines contain acetaminophen. Always read the labels carefully. What side effects may I notice from receiving this medicine? Side effects that you should report to your doctor or health care professional as soon as possible: -allergic reactions like skin rash, itching or hives, swelling of the face, lips, or tongue -breathing problems -confusion -feeling faint or lightheaded, falls -stomach pain -yellowing of the eyes or skin Side effects that usually do not require medical attention (report to your doctor or health care professional if they continue or are bothersome): -nausea, vomiting -stomach upset This list may not describe all possible side effects. Call your doctor for medical advice about side effects. You may report side effects to FDA at 1-800-FDA-1088. Where should I keep my medicine? Keep out of the reach of children. This medicine can be abused. Keep your medicine in a safe place to protect it from theft. Do not share this medicine with anyone. Selling or giving away this medicine is dangerous and against the law. This medicine may cause accidental overdose and death if it taken by other adults, children, or pets. Mix any unused medicine with a substance like cat litter or coffee grounds. Then throw the medicine away in a sealed container like a sealed bag or a coffee can with a lid. Do not use the medicine after the expiration date. Store at room temperature  between 15 and 30 degrees C (59 and 86 degrees F). NOTE: This sheet is a summary. It may not cover all possible information. If you have questions about this medicine, talk to your doctor, pharmacist, or health care provider.    2016, Elsevier/Gold Standard. (2013-12-23 15:29:20)
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
## 2015-05-26 NOTE — ED Notes (Signed)
Pt departed in NAD.  

## 2015-06-18 ENCOUNTER — Emergency Department (HOSPITAL_COMMUNITY): Payer: Medicaid Other

## 2015-06-18 ENCOUNTER — Encounter (HOSPITAL_COMMUNITY): Payer: Self-pay

## 2015-06-18 ENCOUNTER — Emergency Department (HOSPITAL_COMMUNITY)
Admission: EM | Admit: 2015-06-18 | Discharge: 2015-06-18 | Disposition: A | Discharge status: Home / Self Care | Payer: Medicaid Other | Attending: Emergency Medicine | Admitting: Emergency Medicine

## 2015-06-18 DIAGNOSIS — R509 Fever, unspecified: Secondary | ICD-10-CM | POA: Diagnosis present

## 2015-06-18 DIAGNOSIS — Z8659 Personal history of other mental and behavioral disorders: Secondary | ICD-10-CM | POA: Insufficient documentation

## 2015-06-18 DIAGNOSIS — J020 Streptococcal pharyngitis: Secondary | ICD-10-CM

## 2015-06-18 DIAGNOSIS — Z791 Long term (current) use of non-steroidal anti-inflammatories (NSAID): Secondary | ICD-10-CM | POA: Insufficient documentation

## 2015-06-18 LAB — RAPID STREP SCREEN (MED CTR MEBANE ONLY): Streptococcus, Group A Screen (Direct): POSITIVE — AB

## 2015-06-18 MED ORDER — IBUPROFEN 400 MG PO TABS
800.0000 mg | ORAL_TABLET | Freq: Once | ORAL | Status: AC
  Administered 2015-06-18: 800 mg via ORAL
  Filled 2015-06-18: qty 2

## 2015-06-18 MED ORDER — ACETAMINOPHEN 325 MG PO TABS
ORAL_TABLET | ORAL | Status: AC
  Filled 2015-06-18: qty 2

## 2015-06-18 MED ORDER — IBUPROFEN 800 MG PO TABS: 800.0000 mg | ORAL_TABLET | Freq: Three times a day (TID) | ORAL | Status: DC

## 2015-06-18 MED ORDER — ACETAMINOPHEN 325 MG PO TABS
650.0000 mg | ORAL_TABLET | Freq: Once | ORAL | Status: AC | PRN
  Administered 2015-06-18: 650 mg via ORAL

## 2015-06-18 MED ORDER — PENICILLIN G BENZATHINE 1200000 UNIT/2ML IM SUSP
1.2000 10*6.[IU] | Freq: Once | INTRAMUSCULAR | Status: AC
  Administered 2015-06-18: 1.2 10*6.[IU] via INTRAMUSCULAR
  Filled 2015-06-18: qty 2

## 2015-06-18 MED ORDER — DEXAMETHASONE SODIUM PHOSPHATE 10 MG/ML IJ SOLN
10.0000 mg | Freq: Once | INTRAMUSCULAR | Status: AC
  Administered 2015-06-18: 10 mg via INTRAMUSCULAR
  Filled 2015-06-18: qty 1

## 2015-06-18 NOTE — ED Notes (Signed)
Pt is in stable condition upon d/c and ambulates from ED. 
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
## 2015-06-18 NOTE — Discharge Instructions (Signed)

## 2015-06-18 NOTE — ED Provider Notes (Signed)
CSN: [id number]     Arrival date & time 06/18/15  1952 History   First MD Initiated Contact with Patient 06/18/15 2010     Chief Complaint  Patient presents with  . Fever  . URI     (Consider location/radiation/quality/duration/timing/severity/associated sxs/prior Treatment) HPI Joy Rivera is a 25 y.o. female with PMH significant for depression who presents with 2-3 days of gradual onset, constant, worsening sore throat, fever (max temp around 102), chills, myalgias, cough, and generalized headache.  No prior treatment.  Boyfriend sick with cough/cold symptoms.  On arrival to ED, temp 102.3.  Denies N/V/D, abdominal pain, CP, or SOB.   Past Medical History  Diagnosis Date  . Medical history non-contributory   . Depression   . Abnormal Pap smear    Past Surgical History  Procedure Laterality Date  . No past surgeries     Family History  Problem Relation Age of Onset  . Hypertension Father   . Heart disease Father   . Cancer Maternal Aunt   . Cancer Maternal Uncle   . Hypertension Paternal Grandmother    Social History  Substance Use Topics  . Smoking status: Never Smoker   . Smokeless tobacco: Never Used  . Alcohol Use: No   OB History    Gravida Para Term Preterm AB TAB SAB Ectopic Multiple Living   0 1 1 0 0 0 1     Review of Systems All other systems negative unless otherwise stated in HPI    Allergies  Review of patient's allergies indicates no known allergies.  Home Medications   Prior to Admission medications   Medication Sig Start Date End Date Taking? Authorizing Provider  HYDROcodone-acetaminophen (NORCO/VICODIN) 5-325 MG tablet Take 1 tablet by mouth every 4 (four) hours as needed. 05/26/15   Dione Booze, MD  methocarbamol (ROBAXIN) 500 MG tablet Take 1 tablet (500 mg total) by mouth every 6 (six) hours as needed for muscle spasms. 05/26/15   Dione Booze, MD  naproxen (NAPROSYN) 500 MG tablet Take 1 tablet (500 mg total) by mouth 2 (two)  times daily. 05/26/15   Dione Booze, MD   BP 123/87 mmHg  Pulse 108  Temp(Src) 101.1 F (38.4 C) (Oral)  Resp 14  Ht  (1.6 m)  Wt 76.567 kg  BMI 29.91 kg/m2  SpO2 100%  LMP 06/15/2015 Physical Exam  Constitutional: She is oriented to person, place, and time. She appears well-developed and well-nourished.  Non-toxic appearance. She does not have a sickly appearance. She does not appear ill.  HENT:  Head: Normocephalic and atraumatic.  Right Ear: Tympanic membrane normal.  Left Ear: Tympanic membrane normal.  Nose: Rhinorrhea present.  Mouth/Throat: Uvula is midline and mucous membranes are normal. Posterior oropharyngeal edema and posterior oropharyngeal erythema present. No oropharyngeal exudate.  Eyes: Conjunctivae are normal. Pupils are equal, round, and reactive to light.  Neck: Normal range of motion. Neck supple.  Cardiovascular: Normal rate and regular rhythm.   Pulmonary/Chest: Effort normal. No accessory muscle usage or stridor. No respiratory distress. She has no wheezes. She has rhonchi. She has no rales.  Abdominal: Soft. Bowel sounds are normal. She exhibits no distension. There is no tenderness. There is no rebound and no guarding.  Musculoskeletal: Normal range of motion.  Lymphadenopathy:    She has cervical adenopathy.  Neurological: She is alert and oriented to person, place, and time.  Speech clear without dysarthria.  Skin: Skin is warm and dry.  Psychiatric:  She has a normal mood and affect. Her behavior is normal.    ED Course  Procedures (including critical care time) Labs Review Labs Reviewed  RAPID STREP SCREEN (NOT AT [facility]) - Abnormal; Notable for the following:    Streptococcus, Group A Screen (Direct) POSITIVE (*)    All other components within normal limits    Imaging Review Dg Chest 2 View  06/18/2015  CLINICAL DATA:  Fever EXAM: CHEST  2 VIEW COMPARISON:  10/21/2008 FINDINGS: The heart size and mediastinal contours are within normal  limits. Both lungs are clear. The visualized skeletal structures are unremarkable. IMPRESSION: No active cardiopulmonary disease. Electronically Signed   By: Alcide CleverMark  Lukens M.D.   On: 06/18/2015 20:46   I have personally reviewed and evaluated these images and lab results as part of my medical decision-making.   EKG Interpretation None      MDM   Final diagnoses:  Strep pharyngitis   Finding consistent with strep pharyngitis.  Treated with PCN.  Patient given Decadron and ibuprofen in ED.  Tolerating secretions without difficulty.  Appears well.  Able to tolerate PO intake.  Fever and tachycardia resolved.  Discussed return precautions.  Patient agrees and acknowledges the above plan for discharge.    Cheri FowlerKayla Taylar Hartsough, PA-C 06/18/15 2123  Cheri FowlerKayla Brenen Beigel, PA-C 06/18/15 2153  Gerhard Munchobert Lockwood, MD 06/18/15 2203

## 2015-08-16 ENCOUNTER — Inpatient Hospital Stay (HOSPITAL_COMMUNITY)
Admission: AD | Admit: 2015-08-16 | Discharge: 2015-08-16 | Disposition: A | Discharge status: Home / Self Care | Payer: Medicaid Other | Source: Ambulatory Visit | Attending: Obstetrics & Gynecology | Admitting: Obstetrics & Gynecology

## 2015-08-16 ENCOUNTER — Inpatient Hospital Stay (HOSPITAL_COMMUNITY): Payer: Medicaid Other

## 2015-08-16 ENCOUNTER — Encounter (HOSPITAL_COMMUNITY): Payer: Self-pay | Admitting: *Deleted

## 2015-08-16 DIAGNOSIS — Z3A01 Less than 8 weeks gestation of pregnancy: Secondary | ICD-10-CM | POA: Insufficient documentation

## 2015-08-16 DIAGNOSIS — R109 Unspecified abdominal pain: Secondary | ICD-10-CM | POA: Diagnosis not present

## 2015-08-16 DIAGNOSIS — K5900 Constipation, unspecified: Secondary | ICD-10-CM | POA: Diagnosis not present

## 2015-08-16 DIAGNOSIS — O26899 Other specified pregnancy related conditions, unspecified trimester: Secondary | ICD-10-CM

## 2015-08-16 DIAGNOSIS — O26891 Other specified pregnancy related conditions, first trimester: Secondary | ICD-10-CM | POA: Insufficient documentation

## 2015-08-16 DIAGNOSIS — O9989 Other specified diseases and conditions complicating pregnancy, childbirth and the puerperium: Secondary | ICD-10-CM

## 2015-08-16 DIAGNOSIS — R1031 Right lower quadrant pain: Secondary | ICD-10-CM | POA: Insufficient documentation

## 2015-08-16 LAB — URINALYSIS, ROUTINE W REFLEX MICROSCOPIC
BILIRUBIN URINE: NEGATIVE
GLUCOSE, UA: NEGATIVE mg/dL
HGB URINE DIPSTICK: NEGATIVE
KETONES UR: NEGATIVE mg/dL
Leukocytes, UA: NEGATIVE
Nitrite: NEGATIVE
PH: 6 (ref 5.0–8.0)
PROTEIN: NEGATIVE mg/dL
Specific Gravity, Urine: 1.02 (ref 1.005–1.030)

## 2015-08-16 LAB — CBC
HCT: 40.9 % (ref 36.0–46.0)
Hemoglobin: 13.6 g/dL (ref 12.0–15.0)
MCH: 28 pg (ref 26.0–34.0)
MCHC: 33.3 g/dL (ref 30.0–36.0)
MCV: 84.3 fL (ref 78.0–100.0)
PLATELETS: 262 10*3/uL (ref 150–400)
RBC: 4.85 MIL/uL (ref 3.87–5.11)
RDW: 14.1 % (ref 11.5–15.5)
WBC: 7 10*3/uL (ref 4.0–10.5)

## 2015-08-16 LAB — POCT PREGNANCY, URINE: Preg Test, Ur: POSITIVE — AB

## 2015-08-16 LAB — WET PREP, GENITAL
CLUE CELLS WET PREP: NONE SEEN
Sperm: NONE SEEN
Trich, Wet Prep: NONE SEEN
Yeast Wet Prep HPF POC: NONE SEEN

## 2015-08-16 LAB — HCG, QUANTITATIVE, PREGNANCY: HCG, BETA CHAIN, QUANT, S: 2033 m[IU]/mL — AB (ref <5)

## 2015-08-16 LAB — HCG, SERUM, QUALITATIVE: PREG SERUM: POSITIVE — AB

## 2015-08-16 MED ORDER — DOCUSATE SODIUM 100 MG PO CAPS: 100.0000 mg | ORAL_CAPSULE | Freq: Two times a day (BID) | ORAL | Status: DC

## 2015-08-16 MED ORDER — PROMETHAZINE HCL 25 MG PO TABS: 25.0000 mg | ORAL_TABLET | Freq: Four times a day (QID) | ORAL | Status: DC | PRN

## 2015-08-16 MED ORDER — BUTALBITAL-APAP-CAFFEINE 50-325-40 MG PO TABS: 1.0000 | ORAL_TABLET | Freq: Four times a day (QID) | ORAL | Status: DC | PRN

## 2015-08-16 NOTE — MAU Note (Signed)
Sharp pain in RLQ, having light bleeding and back is killing her. Started over the past couple days.  +HPT last wk.

## 2015-08-16 NOTE — MAU Provider Note (Signed)
History     CSN: [id number]  Arrival date and time: 08/16/15 1441   None     Chief Complaint  Patient presents with  . Abdominal Pain  . Vaginal Bleeding  . Back Pain  . Possible Pregnancy   HPI Pt is [redacted]w[redacted]d pregnant [id number] who presents with RLQ pain- pt has hx of right ectopic treated with MTX.  Pt found out Friday she was pregnant with + HPT. Pt states the pain is constant dull with sometimes sharp.  RN note:  Expand All Collapse All   Sharp pain in RLQ, having light bleeding and back is killing her. Started over the past couple days. +HPT last wk.       Past Medical History  Diagnosis Date  . Medical history non-contributory   . Depression   . Abnormal Pap smear     Past Surgical History  Procedure Laterality Date  . No past surgeries      Family History  Problem Relation Age of Onset  . Hypertension Father   . Heart disease Father   . Cancer Maternal Aunt   . Cancer Maternal Uncle   . Hypertension Paternal Grandmother     Social History  Substance Use Topics  . Smoking status: Never Smoker   . Smokeless tobacco: Never Used  . Alcohol Use: No    Allergies: No Known Allergies  Prescriptions prior to admission  Medication Sig Dispense Refill Last Dose  . HYDROcodone-acetaminophen (NORCO/VICODIN) 5-325 MG tablet Take 1 tablet by mouth every 4 (four) hours as needed. 15 tablet 0   . ibuprofen (ADVIL,MOTRIN) 800 MG tablet Take 1 tablet (800 mg total) by mouth 3 (three) times daily. 21 tablet 0   . methocarbamol (ROBAXIN) 500 MG tablet Take 1 tablet (500 mg total) by mouth every 6 (six) hours as needed for muscle spasms. 40 tablet 0   . naproxen (NAPROSYN) 500 MG tablet Take 1 tablet (500 mg total) by mouth 2 (two) times daily. 60 tablet 0     Review of Systems  Constitutional: Negative for fever and chills.  Eyes: Negative for blurred vision.  Gastrointestinal: Positive for abdominal pain and constipation. Negative for nausea, vomiting and diarrhea.        Had hard bowel movement yesterday  Genitourinary: Negative for dysuria.  Musculoskeletal: Positive for back pain.  Neurological: Positive for dizziness and headaches.       Comes and goes   Physical Exam   Blood pressure 116/72, pulse 98, temperature 98.5 F (36.9 C), resp. rate 18, height  (1.575 m), weight 177 lb (80.287 kg), last menstrual period 07/16/2015, unknown if currently breastfeeding.  Physical Exam  Nursing note and vitals reviewed. Constitutional: She is oriented to person, place, and time. She appears well-developed and well-nourished. No distress.  HENT:  Head: Normocephalic.  Eyes: Pupils are equal, round, and reactive to light.  Neck: Normal range of motion. Neck supple.  Cardiovascular: Normal rate.   Respiratory: Effort normal.  GI: Soft. She exhibits no distension. There is tenderness. There is no rebound and no guarding.  Genitourinary:  Small amount of creamy white discharge in vault;  Cervix parous, clean,  Closed NT; uterus NSSC NT; right adnexa tender without palpable enlargement- no rebound; left adnexa without tenderness or enlargement noted; exam complicated by habitus  Musculoskeletal: Normal range of motion.  Neurological: She is alert and oriented to person, place, and time.  Skin: Skin is warm and dry.  Psychiatric: She has a normal mood and  affect.    MAU Course  Procedures Results for orders placed or performed during the hospital encounter of 08/16/15 (from the past 24 hour(s))  Urinalysis, Routine w reflex microscopic (not at [facility])     Status: None   Collection Time: 08/16/15  2:50 PM  Result Value Ref Range   Color, Urine YELLOW YELLOW   APPearance CLEAR CLEAR   Specific Gravity, Urine 1.020 1.005 - 1.030   pH 6.0 5.0 - 8.0   Glucose, UA NEGATIVE NEGATIVE mg/dL   Hgb urine dipstick NEGATIVE NEGATIVE   Bilirubin Urine NEGATIVE NEGATIVE   Ketones, ur NEGATIVE NEGATIVE mg/dL   Protein, ur NEGATIVE NEGATIVE mg/dL   Nitrite  NEGATIVE NEGATIVE   Leukocytes, UA NEGATIVE NEGATIVE  Pregnancy, urine POC     Status: Abnormal   Collection Time: 08/16/15  3:03 PM  Result Value Ref Range   Preg Test, Ur POSITIVE (A) NEGATIVE  CBC     Status: None   Collection Time: 08/16/15  3:24 PM  Result Value Ref Range   WBC 7.0 4.0 - 10.5 K/uL   RBC 4.85 3.87 - 5.11 MIL/uL   Hemoglobin 13.6 12.0 - 15.0 g/dL   HCT 62.3 76.2 - 83.1 %   MCV 84.3 78.0 - 100.0 fL   MCH 28.0 26.0 - 34.0 pg   MCHC 33.3 30.0 - 36.0 g/dL   RDW 51.7 61.6 - 07.3 %   Platelets 262 150 - 400 K/uL  hCG, serum, qualitative     Status: Abnormal   Collection Time: 08/16/15  3:24 PM  Result Value Ref Range   Preg, Serum POSITIVE (A) NEGATIVE  hCG, quantitative, pregnancy     Status: Abnormal   Collection Time: 08/16/15  3:24 PM  Result Value Ref Range   hCG, Beta Chain, Quant, S 2033 (H) <5 mIU/mL  Wet prep, genital     Status: Abnormal   Collection Time: 08/16/15  5:00 PM  Result Value Ref Range   Yeast Wet Prep HPF POC NONE SEEN NONE SEEN   Trich, Wet Prep NONE SEEN NONE SEEN   Clue Cells Wet Prep HPF POC NONE SEEN NONE SEEN   WBC, Wet Prep HPF POC FEW (A) NONE SEEN   Sperm NONE SEEN   US Ob Comp Less 14 Wks  08/16/2015  CLINICAL DATA:  Two day history of lower abdominal pain and bleeding EXAM: OBSTETRIC <14 WK [place] AND TRANSVAGINAL OB US TECHNIQUE: Both transabdominal and transvaginal ultrasound examinations were performed for complete evaluation of the gestation as well as the maternal uterus, adnexal regions, and pelvic cul-de-sac. Transvaginal technique was performed to assess early pregnancy. COMPARISON:  None. FINDINGS: Intrauterine gestational sac: Visualized Yolk sac:  Not visualized Embryo:  Not visualized Cardiac Activity: Not visualized MSD: 4 mm  mm   5 w   1  d Subchorionic hemorrhage:  None visualized. Maternal uterus/adnexae: Maternal ovaries are normal in size and contour bilaterally. There is no extrauterine pelvic or adnexal mass. A small  amount of free pelvic fluid is noted. IMPRESSION: Probable early intrauterine gestational sac, but no yolk sac, fetal pole, or cardiac activity yet visualized. Recommend follow-up quantitative B-HCG levels and follow-up US in 14 days to confirm and assess viability. This recommendation follows SRU consensus guidelines: Diagnostic Criteria for Nonviable Pregnancy Early in the First Trimester. Malva Limes Med 2013; 710:6269-48. Based on gestational sac size, estimated gestational age is approximately 5 weeks. Small amount of free maternal pelvic fluid is noted. No extrauterine pelvic or  adnexal masses are identified by ultrasound. Electronically Signed   By: Bretta BangWilliam  Woodruff III M.D.   On: 08/16/2015 16:58   [place] Ob Transvaginal  08/16/2015  CLINICAL DATA:  Two day history of lower abdominal pain and bleeding EXAM: OBSTETRIC <14 WK US AND TRANSVAGINAL OB US TECHNIQUE: Both transabdominal and transvaginal ultrasound examinations were performed for complete evaluation of the gestation as well as the maternal uterus, adnexal regions, and pelvic cul-de-sac. Transvaginal technique was performed to assess early pregnancy. COMPARISON:  None. FINDINGS: Intrauterine gestational sac: Visualized Yolk sac:  Not visualized Embryo:  Not visualized Cardiac Activity: Not visualized MSD: 4 mm  mm   5 w   1  d Subchorionic hemorrhage:  None visualized. Maternal uterus/adnexae: Maternal ovaries are normal in size and contour bilaterally. There is no extrauterine pelvic or adnexal mass. A small amount of free pelvic fluid is noted. IMPRESSION: Probable early intrauterine gestational sac, but no yolk sac, fetal pole, or cardiac activity yet visualized. Recommend follow-up quantitative B-HCG levels and follow-up US in 14 days to confirm and assess viability. This recommendation follows SRU consensus guidelines: Diagnostic Criteria for Nonviable Pregnancy Early in the First Trimester. Malva Limes Engl J Med 2013; 161:0960-45; 369:1443-51. Based on gestational sac  size, estimated gestational age is approximately 5 weeks. Small amount of free maternal pelvic fluid is noted. No extrauterine pelvic or adnexal masses are identified by ultrasound. Electronically Signed   By: Bretta BangWilliam  Woodruff III M.D.   On: 08/16/2015 16:58    Assessment and Plan  Abdominal pain in pregnancy Pregnancy of unknown location- [facility] Return sooner for increase in pain or bleeding  Amsi Grimley 08/16/2015, 3:12 PM

## 2015-08-16 NOTE — Discharge Instructions (Signed)

## 2015-08-17 LAB — GC/CHLAMYDIA PROBE AMP (~~LOC~~) NOT AT ARMC
Chlamydia: NEGATIVE
Neisseria Gonorrhea: NEGATIVE

## 2015-08-18 ENCOUNTER — Ambulatory Visit (HOSPITAL_COMMUNITY)
Admission: RE | Admit: 2015-08-18 | Discharge: 2015-08-18 | Disposition: A | Discharge status: Home / Self Care | Payer: Medicaid Other | Source: Ambulatory Visit | Attending: Obstetrics and Gynecology | Admitting: Obstetrics and Gynecology

## 2015-08-18 ENCOUNTER — Ambulatory Visit: Payer: Medicaid Other | Admitting: Obstetrics and Gynecology

## 2015-08-18 ENCOUNTER — Other Ambulatory Visit: Payer: Medicaid Other | Admitting: General Practice

## 2015-08-18 DIAGNOSIS — O3680X0 Pregnancy with inconclusive fetal viability, not applicable or unspecified: Secondary | ICD-10-CM

## 2015-08-18 DIAGNOSIS — O26891 Other specified pregnancy related conditions, first trimester: Secondary | ICD-10-CM | POA: Insufficient documentation

## 2015-08-18 DIAGNOSIS — O26899 Other specified pregnancy related conditions, unspecified trimester: Secondary | ICD-10-CM | POA: Diagnosis present

## 2015-08-18 DIAGNOSIS — Z36 Encounter for antenatal screening of mother: Secondary | ICD-10-CM | POA: Insufficient documentation

## 2015-08-18 DIAGNOSIS — R109 Unspecified abdominal pain: Secondary | ICD-10-CM | POA: Insufficient documentation

## 2015-08-18 DIAGNOSIS — Z3A01 Less than 8 weeks gestation of pregnancy: Secondary | ICD-10-CM | POA: Insufficient documentation

## 2015-08-18 DIAGNOSIS — Z331 Pregnant state, incidental: Secondary | ICD-10-CM | POA: Diagnosis present

## 2015-08-18 LAB — HCG, QUANTITATIVE, PREGNANCY: HCG, BETA CHAIN, QUANT, S: 4138 m[IU]/mL — AB (ref <5)

## 2015-08-18 NOTE — Progress Notes (Signed)
Patient returned from ultrasound for results. Dr Vergie LivingPickens finds ultrasound favorable for normal progressing pregnancy. Patient needs ultrasound in 2 weeks.  Scheduled for 7/27 with office appt to follow for results. Informed patient. Patient verbalized understanding and asked about her pain. Asked patient if she had been constipated recently. Patient states most of the time she is and only produces small amounts of balls of stool. Discussed high fiber diet, drinking plenty of fluids & OTC stool softener BID as needed. Patient verbalized understanding to all & had no questions

## 2015-08-18 NOTE — Addendum Note (Signed)
Addended by: Kathee DeltonHILLMAN, CARRIE L on: 08/18/2015 03:13 PM   Modules accepted: Orders

## 2015-08-18 NOTE — Progress Notes (Signed)
Patient here for stat bhcg today. Reports continued RLQ pain at a 8. Also reports some spotting. Reviewed patient's history/labs with Dr Vergie LivingPickens who agrees to favorable rise in bhcg levels but given patient's pain level and history, needs ultrasound today. Informed patient of results & recommendations. Escorted patient to ultrasound for results. Patient will return to office for results.

## 2015-09-01 ENCOUNTER — Ambulatory Visit (HOSPITAL_COMMUNITY)
Admission: RE | Admit: 2015-09-01 | Discharge: 2015-09-01 | Disposition: A | Discharge status: Home / Self Care | Payer: Medicaid Other | Source: Ambulatory Visit | Attending: Obstetrics and Gynecology | Admitting: Obstetrics and Gynecology

## 2015-09-01 ENCOUNTER — Ambulatory Visit (INDEPENDENT_AMBULATORY_CARE_PROVIDER_SITE_OTHER): Payer: Medicaid Other | Admitting: Obstetrics & Gynecology

## 2015-09-01 DIAGNOSIS — Z3A00 Weeks of gestation of pregnancy not specified: Secondary | ICD-10-CM | POA: Insufficient documentation

## 2015-09-01 DIAGNOSIS — Z3491 Encounter for supervision of normal pregnancy, unspecified, first trimester: Secondary | ICD-10-CM

## 2015-09-01 DIAGNOSIS — O3680X0 Pregnancy with inconclusive fetal viability, not applicable or unspecified: Secondary | ICD-10-CM

## 2015-09-01 DIAGNOSIS — Z331 Pregnant state, incidental: Secondary | ICD-10-CM | POA: Insufficient documentation

## 2015-09-01 DIAGNOSIS — Z36 Encounter for antenatal screening of mother: Secondary | ICD-10-CM | POA: Insufficient documentation

## 2015-09-01 MED ORDER — PRENATAL VITAMINS 0.8 MG PO TABS: 1.0000 | ORAL_TABLET | Freq: Every day | ORAL | 12 refills | Status: DC

## 2015-09-01 NOTE — Progress Notes (Signed)
Ultrasounds Results Note  SUBJECTIVE HPI:  Joy Rivera is a 25 y.o. G5P1031 at [redacted]w[redacted]d by LMP who presents to the [facility] for followup ultrasound results. The patient denies abdominal pain or vaginal bleeding.    Repeat ultrasound was performed earlier today.   Past Medical History:  Diagnosis Date  . Abnormal Pap smear   . Depression   . Medical history non-contributory    Past Surgical History:  Procedure Laterality Date  . NO PAST SURGERIES     Social History   Social History  . Marital status: Single    Spouse name: N/A  . Number of children: N/A  . Years of education: N/A   Occupational History  . Not on file.   Social History Main Topics  . Smoking status: Never Smoker  . Smokeless tobacco: Never Used  . Alcohol use No  . Drug use: No  . Sexual activity: Yes    Birth control/ protection: None   Other Topics Concern  . Not on file   Social History Narrative  . No narrative on file   Current Outpatient Prescriptions on File Prior to Visit  Medication Sig Dispense Refill  . docusate sodium (COLACE) 100 MG capsule Take 1 capsule (100 mg total) by mouth every 12 (twelve) hours. 60 capsule 0  . HYDROcodone-acetaminophen (NORCO/VICODIN) 5-325 MG tablet Take 1 tablet by mouth every 4 (four) hours as needed. (Patient not taking: Reported on 08/16/2015) 15 tablet 0  . promethazine (PHENERGAN) 25 MG tablet Take 1 tablet (25 mg total) by mouth every 6 (six) hours as needed for nausea or vomiting. 30 tablet 0   No current facility-administered medications on file prior to visit.    No Known Allergies  I have reviewed patient's Past Medical Hx, Surgical Hx, Family Hx, Social Hx, medications and allergies.   Review of Systems Review of Systems  Constitutional: Negative for fever and chills.  Gastrointestinal: Negative for nausea, vomiting, abdominal pain, diarrhea and constipation.  Genitourinary: Negative for dysuria.  Musculoskeletal: Negative  for back pain.  Neurological: Negative for dizziness and weakness.    Physical Exam  LMP 07/16/2015   GENERAL: Well-developed, well-nourished female in no acute distress.  HEENT: Normocephalic, atraumatic.   LUNGS: Effort normal ABDOMEN: soft, non-tender HEART: Regular rate  SKIN: Warm, dry and without erythema PSYCH: Normal mood and affect NEURO: Alert and oriented x 4  LAB RESULTS No results found for this or any previous visit (from the past 24 hour(s)).  IMAGING US Ob Comp Less 14 Wks  Result Date: 08/16/2015 CLINICAL DATA:  Two day history of lower abdominal pain and bleeding EXAM: OBSTETRIC <14 WK [place] AND TRANSVAGINAL OB US TECHNIQUE: Both transabdominal and transvaginal ultrasound examinations were performed for complete evaluation of the gestation as well as the maternal uterus, adnexal regions, and pelvic cul-de-sac. Transvaginal technique was performed to assess early pregnancy. COMPARISON:  None. FINDINGS: Intrauterine gestational sac: Visualized Yolk sac:  Not visualized Embryo:  Not visualized Cardiac Activity: Not visualized MSD: 4 mm  mm   5 w   1  d Subchorionic hemorrhage:  None visualized. Maternal uterus/adnexae: Maternal ovaries are normal in size and contour bilaterally. There is no extrauterine pelvic or adnexal mass. A small amount of free pelvic fluid is noted. IMPRESSION: Probable early intrauterine gestational sac, but no yolk sac, fetal pole, or cardiac activity yet visualized. Recommend follow-up quantitative B-HCG levels and follow-up US in 14 days to confirm and assess viability. This recommendation follows  SRU consensus guidelines: Diagnostic Criteria for Nonviable Pregnancy Early in the First Trimester. Malva Limes Med 2013; 161:0960-45. Based on gestational sac size, estimated gestational age is approximately 5 weeks. Small amount of free maternal pelvic fluid is noted. No extrauterine pelvic or adnexal masses are identified by ultrasound. Electronically Signed   By:  Bretta Bang III M.D.   On: 08/16/2015 16:58   US Ob Transvaginal  Result Date: 09/01/2015 CLINICAL DATA:  Early pregnancy. Followup viability. Quantitative beta HCG was 4,138 on 08/18/2015. EXAM: TRANSVAGINAL OB ULTRASOUND TECHNIQUE: Transvaginal ultrasound was performed for complete evaluation of the gestation as well as the maternal uterus, adnexal regions, and pelvic cul-de-sac. COMPARISON:  None. FINDINGS: Intrauterine gestational sac: Present Yolk sac:  Present Embryo:  Present Cardiac Activity: Present Heart Rate: 135 bpm CRL:   8.8  mm   6 w 6 d                  [place] EDC: 04/20/2016 Subchorionic hemorrhage:  Small subchorionic hemorrhage. Maternal uterus/adnexae: Right corpus luteum cyst is present. Left ovary has a normal appearance. Trace to small amount of free pelvic fluid. IMPRESSION: 1. Single living intrauterine embryo. 2. Appropriate progression since prior study. 3. EDC by today's exam is 04/20/2016. Electronically Signed   By: Norva Pavlov M.D.   On: 09/01/2015 10:47  US Ob Transvaginal  Result Date: 08/18/2015 CLINICAL DATA:  25 year old pregnant female with right pelvic pain and spotting for 4 days. EXAM: TRANSVAGINAL OB ULTRASOUND TECHNIQUE: Transvaginal ultrasound was performed for complete evaluation of the gestation as well as the maternal uterus, adnexal regions, and pelvic cul-de-sac. COMPARISON:  08/16/2015 FINDINGS: Intrauterine gestational sac: Visualized Yolk sac:  Probable Embryo:  Not visualized Cardiac Activity: Not visualized MSD: 5.3  mm   5 w   2  d             [place] EDC: 04/17/2016. Subchorionic hemorrhage:  None visualized. Maternal uterus/adnexae: The ovaries are unremarkable. There is no evidence of adnexal mass. A trace amount of free fluid is identified. IMPRESSION: Probable intrauterine gestational sac containing likely yolk sac. Fetal pole not identified at this time. This presumed intrauterine gestational sac has appropriately enlarged 08/16/2015. Estimated  gestational age by this ultrasound is 5 weeks 2 days. Recommend follow-up quantitative B-HCG levels and follow-up US in 14 days to confirm and assess viability. This recommendation follows SRU consensus guidelines: Diagnostic Criteria for Nonviable Pregnancy Early in the First Trimester. Malva Limes Med 2013; 409:8119-14. Electronically Signed   By: Harmon Pier M.D.   On: 08/18/2015 14:02   US Ob Transvaginal  Result Date: 08/16/2015 CLINICAL DATA:  Two day history of lower abdominal pain and bleeding EXAM: OBSTETRIC <14 WK [place] AND TRANSVAGINAL OB US TECHNIQUE: Both transabdominal and transvaginal ultrasound examinations were performed for complete evaluation of the gestation as well as the maternal uterus, adnexal regions, and pelvic cul-de-sac. Transvaginal technique was performed to assess early pregnancy. COMPARISON:  None. FINDINGS: Intrauterine gestational sac: Visualized Yolk sac:  Not visualized Embryo:  Not visualized Cardiac Activity: Not visualized MSD: 4 mm  mm   5 w   1  d Subchorionic hemorrhage:  None visualized. Maternal uterus/adnexae: Maternal ovaries are normal in size and contour bilaterally. There is no extrauterine pelvic or adnexal mass. A small amount of free pelvic fluid is noted. IMPRESSION: Probable early intrauterine gestational sac, but no yolk sac, fetal pole, or cardiac activity yet visualized. Recommend follow-up quantitative B-HCG levels and follow-up US in  14 days to confirm and assess viability. This recommendation follows SRU consensus guidelines: Diagnostic Criteria for Nonviable Pregnancy Early in the First Trimester. Malva Limes Med 2013; 161:0960-45. Based on gestational sac size, estimated gestational age is approximately 5 weeks. Small amount of free maternal pelvic fluid is noted. No extrauterine pelvic or adnexal masses are identified by ultrasound. Electronically Signed   By: Bretta Bang III M.D.   On: 08/16/2015 16:58    ASSESSMENT 1. Normal pregnancy in first  trimester   [redacted]w[redacted]d  PLAN Discharge home in stable condition Patient advised to start/continue taking prenatal vitamins  Pregnancy confirmation letter given Patient advised to start prenatal care with [facility] provider of choice as soon as possible  Adam Phenix, MD  09/01/2015  11:41 AM

## 2015-09-01 NOTE — Patient Instructions (Signed)
First Trimester of Pregnancy The first trimester of pregnancy is from week 1 until the end of week 12 (months 1 through 3). A week after a sperm fertilizes an egg, the egg will implant on the wall of the uterus. This embryo will begin to develop into a baby. Genes from you and your partner are forming the baby. The female genes determine whether the baby is a boy or a girl. At 6-8 weeks, the eyes and face are formed, and the heartbeat can be seen on ultrasound. At the end of 12 weeks, all the baby's organs are formed.  Now that you are pregnant, you will want to do everything you can to have a healthy baby. Two of the most important things are to get good prenatal care and to follow your health care provider's instructions. Prenatal care is all the medical care you receive before the baby's birth. This care will help prevent, find, and treat any problems during the pregnancy and childbirth. BODY CHANGES Your body goes through many changes during pregnancy. The changes vary from woman to woman.   You may gain or lose a couple of pounds at first.  You may feel sick to your stomach (nauseous) and throw up (vomit). If the vomiting is uncontrollable, call your health care provider.  You may tire easily.  You may develop headaches that can be relieved by medicines approved by your health care provider.  You may urinate more often. Painful urination may mean you have a bladder infection.  You may develop heartburn as a result of your pregnancy.  You may develop constipation because certain hormones are causing the muscles that push waste through your intestines to slow down.  You may develop hemorrhoids or swollen, bulging veins (varicose veins).  Your breasts may begin to grow larger and become tender. Your nipples may stick out more, and the tissue that surrounds them (areola) may become darker.  Your gums may bleed and may be sensitive to brushing and flossing.  Dark spots or blotches (chloasma,  mask of pregnancy) may develop on your face. This will likely fade after the baby is born.  Your menstrual periods will stop.  You may have a loss of appetite.  You may develop cravings for certain kinds of food.  You may have changes in your emotions from day to day, such as being excited to be pregnant or being concerned that something may go wrong with the pregnancy and baby.  You may have more vivid and strange dreams.  You may have changes in your hair. These can include thickening of your hair, rapid growth, and changes in texture. Some women also have hair loss during or after pregnancy, or hair that feels dry or thin. Your hair will most likely return to normal after your baby is born. WHAT TO EXPECT AT YOUR PRENATAL VISITS During a routine prenatal visit:  You will be weighed to make sure you and the baby are growing normally.  Your blood pressure will be taken.  Your abdomen will be measured to track your baby's growth.  The fetal heartbeat will be listened to starting around week 10 or 12 of your pregnancy.  Test results from any previous visits will be discussed. Your health care provider may ask you:  How you are feeling.  If you are feeling the baby move.  If you have had any abnormal symptoms, such as leaking fluid, bleeding, severe headaches, or abdominal cramping.  If you are using any tobacco products,   including cigarettes, chewing tobacco, and electronic cigarettes.  If you have any questions. Other tests that may be performed during your first trimester include:  Blood tests to find your blood type and to check for the presence of any previous infections. They will also be used to check for low iron levels (anemia) and Rh antibodies. Later in the pregnancy, blood tests for diabetes will be done along with other tests if problems develop.  Urine tests to check for infections, diabetes, or protein in the urine.  An ultrasound to confirm the proper growth  and development of the baby.  An amniocentesis to check for possible genetic problems.  Fetal screens for spina bifida and Down syndrome.  You may need other tests to make sure you and the baby are doing well.  HIV (human immunodeficiency virus) testing. Routine prenatal testing includes screening for HIV, unless you choose not to have this test. HOME CARE INSTRUCTIONS  Medicines  Follow your health care provider's instructions regarding medicine use. Specific medicines may be either safe or unsafe to take during pregnancy.  Take your prenatal vitamins as directed.  If you develop constipation, try taking a stool softener if your health care provider approves. Diet  Eat regular, well-balanced meals. Choose a variety of foods, such as meat or vegetable-based protein, fish, milk and low-fat dairy products, vegetables, fruits, and whole grain breads and cereals. Your health care provider will help you determine the amount of weight gain that is right for you.  Avoid raw meat and uncooked cheese. These carry germs that can cause birth defects in the baby.  Eating four or five small meals rather than three large meals a day may help relieve nausea and vomiting. If you start to feel nauseous, eating a few soda crackers can be helpful. Drinking liquids between meals instead of during meals also seems to help nausea and vomiting.  If you develop constipation, eat more high-fiber foods, such as fresh vegetables or fruit and whole grains. Drink enough fluids to keep your urine clear or pale yellow. Activity and Exercise  Exercise only as directed by your health care provider. Exercising will help you:  Control your weight.  Stay in shape.  Be prepared for labor and delivery.  Experiencing pain or cramping in the lower abdomen or low back is a good sign that you should stop exercising. Check with your health care provider before continuing normal exercises.  Try to avoid standing for long  periods of time. Move your legs often if you must stand in one place for a long time.  Avoid heavy lifting.  Wear low-heeled shoes, and practice good posture.  You may continue to have sex unless your health care provider directs you otherwise. Relief of Pain or Discomfort  Wear a good support bra for breast tenderness.   Take warm sitz baths to soothe any pain or discomfort caused by hemorrhoids. Use hemorrhoid cream if your health care provider approves.   Rest with your legs elevated if you have leg cramps or low back pain.  If you develop varicose veins in your legs, wear support hose. Elevate your feet for 15 minutes, 3-4 times a day. Limit salt in your diet. Prenatal Care  Schedule your prenatal visits by the twelfth week of pregnancy. They are usually scheduled monthly at first, then more often in the last 2 months before delivery.  Write down your questions. Take them to your prenatal visits.  Keep all your prenatal visits as directed by your   health care provider. Safety  Wear your seat belt at all times when driving.  Make a list of emergency phone numbers, including numbers for family, friends, the hospital, and police and fire departments. General Tips  Ask your health care provider for a referral to a local prenatal education class. Begin classes no later than at the beginning of month 6 of your pregnancy.  Ask for help if you have counseling or nutritional needs during pregnancy. Your health care provider can offer advice or refer you to specialists for help with various needs.  Do not use hot tubs, steam rooms, or saunas.  Do not douche or use tampons or scented sanitary pads.  Do not cross your legs for long periods of time.  Avoid cat litter boxes and soil used by cats. These carry germs that can cause birth defects in the baby and possibly loss of the fetus by miscarriage or stillbirth.  Avoid all smoking, herbs, alcohol, and medicines not prescribed by  your health care provider. Chemicals in these affect the formation and growth of the baby.  Do not use any tobacco products, including cigarettes, chewing tobacco, and electronic cigarettes. If you need help quitting, ask your health care provider. You may receive counseling support and other resources to help you quit.  Schedule a dentist appointment. At home, brush your teeth with a soft toothbrush and be gentle when you floss. SEEK MEDICAL CARE IF:   You have dizziness.  You have mild pelvic cramps, pelvic pressure, or nagging pain in the abdominal area.  You have persistent nausea, vomiting, or diarrhea.  You have a bad smelling vaginal discharge.  You have pain with urination.  You notice increased swelling in your face, hands, legs, or ankles. SEEK IMMEDIATE MEDICAL CARE IF:   You have a fever.  You are leaking fluid from your vagina.  You have spotting or bleeding from your vagina.  You have severe abdominal cramping or pain.  You have rapid weight gain or loss.  You vomit blood or material that looks like coffee grounds.  You are exposed to German measles and have never had them.  You are exposed to fifth disease or chickenpox.  You develop a severe headache.  You have shortness of breath.  You have any kind of trauma, such as from a fall or a car accident.   This information is not intended to replace advice given to you by your health care provider. Make sure you discuss any questions you have with your health care provider.   Document Released: 01/16/2001 Document Revised: 02/12/2014 Document Reviewed: 12/02/2012 Elsevier Interactive Patient Education 2016 [employer] Inc.  

## 2015-10-06 ENCOUNTER — Inpatient Hospital Stay (HOSPITAL_COMMUNITY)
Admission: AD | Admit: 2015-10-06 | Discharge: 2015-10-07 | Disposition: A | Discharge status: Home / Self Care | Payer: Medicaid Other | Source: Ambulatory Visit | Attending: Obstetrics and Gynecology | Admitting: Obstetrics and Gynecology

## 2015-10-06 ENCOUNTER — Encounter (HOSPITAL_COMMUNITY): Payer: Self-pay | Admitting: *Deleted

## 2015-10-06 DIAGNOSIS — Z3A11 11 weeks gestation of pregnancy: Secondary | ICD-10-CM | POA: Insufficient documentation

## 2015-10-06 DIAGNOSIS — K219 Gastro-esophageal reflux disease without esophagitis: Secondary | ICD-10-CM | POA: Insufficient documentation

## 2015-10-06 DIAGNOSIS — R109 Unspecified abdominal pain: Secondary | ICD-10-CM | POA: Diagnosis present

## 2015-10-06 DIAGNOSIS — O99611 Diseases of the digestive system complicating pregnancy, first trimester: Secondary | ICD-10-CM

## 2015-10-06 LAB — COMPREHENSIVE METABOLIC PANEL
ALBUMIN: 2.9 g/dL — AB (ref 3.5–5.0)
ALT: 13 U/L — AB (ref 14–54)
AST: 14 U/L — AB (ref 15–41)
Alkaline Phosphatase: 28 U/L — ABNORMAL LOW (ref 38–126)
Anion gap: 6 (ref 5–15)
BUN: 12 mg/dL (ref 6–20)
CHLORIDE: 99 mmol/L — AB (ref 101–111)
CO2: 27 mmol/L (ref 22–32)
Calcium: 8.3 mg/dL — ABNORMAL LOW (ref 8.9–10.3)
Creatinine, Ser: 0.71 mg/dL (ref 0.44–1.00)
GFR calc Af Amer: >60 mL/min (ref >60)
GLUCOSE: 82 mg/dL (ref 65–99)
POTASSIUM: 3 mmol/L — AB (ref 3.5–5.1)
SODIUM: 132 mmol/L — AB (ref 135–145)
Total Bilirubin: 0.3 mg/dL (ref 0.3–1.2)
Total Protein: 6 g/dL — ABNORMAL LOW (ref 6.5–8.1)

## 2015-10-06 LAB — URINALYSIS, ROUTINE W REFLEX MICROSCOPIC
Bilirubin Urine: NEGATIVE
Glucose, UA: NEGATIVE mg/dL
HGB URINE DIPSTICK: NEGATIVE
Ketones, ur: 15 mg/dL — AB
Leukocytes, UA: NEGATIVE
Nitrite: NEGATIVE
PH: 7.5 (ref 5.0–8.0)
Protein, ur: NEGATIVE mg/dL
SPECIFIC GRAVITY, URINE: 1.02 (ref 1.005–1.030)

## 2015-10-06 LAB — CBC
HCT: 39.6 % (ref 36.0–46.0)
Hemoglobin: 13.5 g/dL (ref 12.0–15.0)
MCH: 28.2 pg (ref 26.0–34.0)
MCHC: 34.1 g/dL (ref 30.0–36.0)
MCV: 82.7 fL (ref 78.0–100.0)
PLATELETS: 235 10*3/uL (ref 150–400)
RBC: 4.79 MIL/uL (ref 3.87–5.11)
RDW: 13 % (ref 11.5–15.5)
WBC: 7.7 10*3/uL (ref 4.0–10.5)

## 2015-10-06 LAB — TROPONIN I

## 2015-10-06 MED ORDER — PANTOPRAZOLE SODIUM 20 MG PO TBEC: 20.0000 mg | DELAYED_RELEASE_TABLET | Freq: Every day | ORAL | 1 refills | Status: DC

## 2015-10-06 MED ORDER — GI COCKTAIL ~~LOC~~
30.0000 mL | Freq: Once | ORAL | Status: AC
  Administered 2015-10-06: 30 mL via ORAL
  Filled 2015-10-06: qty 30

## 2015-10-06 MED ORDER — BUTALBITAL-APAP-CAFFEINE 50-325-40 MG PO TABS
2.0000 | ORAL_TABLET | Freq: Once | ORAL | Status: AC
  Administered 2015-10-06: 2 via ORAL
  Filled 2015-10-06: qty 2

## 2015-10-06 NOTE — MAU Provider Note (Signed)
History     CSN: [id number]  Arrival date and time: 10/06/15 2041   First Provider Initiated Contact with Patient 10/06/15 2112      Chief Complaint  Patient presents with  . Headache  . Dizziness  . Abdominal Pain   Abdominal Pain  This is a new problem. The current episode started yesterday. The onset quality is gradual. The problem occurs constantly. The problem has been unchanged. The pain is located in the epigastric region. The pain is at a severity of 7/10. The quality of the pain is sharp. The abdominal pain radiates to the chest. Associated symptoms include headaches and nausea. Pertinent negatives include no constipation, diarrhea, fever or vomiting. Nothing aggravates the pain. The pain is relieved by nothing.  Headache   This is a new problem. The current episode started yesterday. The problem occurs constantly. The problem has been unchanged. The pain is located in the frontal region. The pain radiates to the left arm, left shoulder and left neck. The pain quality is similar to prior headaches. The quality of the pain is described as aching. The pain is at a severity of 7/10. Associated symptoms include abdominal pain, dizziness and nausea. Pertinent negatives include no fever or vomiting. Nothing aggravates the symptoms. She has tried nothing for the symptoms.    Past Medical History:  Diagnosis Date  . Abnormal Pap smear   . Depression   . Medical history non-contributory     Past Surgical History:  Procedure Laterality Date  . NO PAST SURGERIES      Family History  Problem Relation Age of Onset  . Hypertension Father   . Heart disease Father   . Cancer Maternal Aunt   . Cancer Maternal Uncle   . Hypertension Paternal Grandmother     Social History  Substance Use Topics  . Smoking status: Never Smoker  . Smokeless tobacco: Never Used  . Alcohol use No    Allergies: No Known Allergies  Prescriptions Prior to Admission  Medication Sig Dispense Refill  Last Dose  . escitalopram (LEXAPRO) 10 MG tablet Take 10 mg by mouth daily.   10/05/2015 at Unknown time  . docusate sodium (COLACE) 100 MG capsule Take 1 capsule (100 mg total) by mouth every 12 (twelve) hours. 60 capsule 0   . HYDROcodone-acetaminophen (NORCO/VICODIN) 5-325 MG tablet Take 1 tablet by mouth every 4 (four) hours as needed. (Patient not taking: Reported on 08/16/2015) 15 tablet 0   . Prenatal Multivit-Min-Fe-FA (PRENATAL VITAMINS) 0.8 MG tablet Take 1 tablet by mouth daily. 30 tablet 12   . promethazine (PHENERGAN) 25 MG tablet Take 1 tablet (25 mg total) by mouth every 6 (six) hours as needed for nausea or vomiting. 30 tablet 0     Review of Systems  Constitutional: Positive for chills. Negative for fever.  Gastrointestinal: Positive for abdominal pain and nausea. Negative for constipation, diarrhea and vomiting.  Neurological: Positive for dizziness, loss of consciousness and headaches.   Physical Exam   Blood pressure 111/61, pulse 89, temperature 98.1 F (36.7 C), temperature source Oral, resp. rate 20, height 5\' 3"  (1.6 m), weight 179 lb 4 oz (81.3 kg), last menstrual period 07/16/2015, SpO2 100 %, unknown if currently breastfeeding.  Physical Exam  Nursing note and vitals reviewed. Constitutional: She is oriented to person, place, and time. She appears well-developed and well-nourished. No distress.  HENT:  Head: Normocephalic.  Cardiovascular: Normal rate.   Respiratory: Effort normal.  GI: Soft. There is no tenderness.  Neurological: She is alert and oriented to person, place, and time.  Skin: Skin is warm and dry.  Psychiatric: She has a normal mood and affect.   Results for orders placed or performed during the hospital encounter of 10/06/15 (from the past 24 hour(s))  Urinalysis, Routine w reflex microscopic ([facility])     Status: Abnormal   Collection Time: 10/06/15  9:03 PM  Result Value Ref Range   Color, Urine YELLOW YELLOW   APPearance CLEAR CLEAR    Specific Gravity, Urine 1.020 1.005 - 1.030   pH 7.5 5.0 - 8.0   Glucose, UA NEGATIVE NEGATIVE mg/dL   Hgb urine dipstick NEGATIVE NEGATIVE   Bilirubin Urine NEGATIVE NEGATIVE   Ketones, ur 15 (A) NEGATIVE mg/dL   Protein, ur NEGATIVE NEGATIVE mg/dL   Nitrite NEGATIVE NEGATIVE   Leukocytes, UA NEGATIVE NEGATIVE  CBC     Status: None   Collection Time: 10/06/15  9:14 PM  Result Value Ref Range   WBC 7.7 4.0 - 10.5 K/uL   RBC 4.79 3.87 - 5.11 MIL/uL   Hemoglobin 13.5 12.0 - 15.0 g/dL   HCT 16.139.6 09.636.0 - 04.546.0 %   MCV 82.7 78.0 - 100.0 fL   MCH 28.2 26.0 - 34.0 pg   MCHC 34.1 30.0 - 36.0 g/dL   RDW 40.913.0 81.111.5 - 91.415.5 %   Platelets 235 150 - 400 K/uL  Comprehensive metabolic panel     Status: Abnormal   Collection Time: 10/06/15  9:14 PM  Result Value Ref Range   Sodium 132 (L) 135 - 145 mmol/L   Potassium 3.0 (L) 3.5 - 5.1 mmol/L   Chloride 99 (L) 101 - 111 mmol/L   CO2 27 22 - 32 mmol/L   Glucose, Bld 82 65 - 99 mg/dL   BUN 12 6 - 20 mg/dL   Creatinine, Ser 7.820.71 0.44 - 1.00 mg/dL   Calcium 8.3 (L) 8.9 - 10.3 mg/dL   Total Protein 6.0 (L) 6.5 - 8.1 g/dL   Albumin 2.9 (L) 3.5 - 5.0 g/dL   AST 14 (L) 15 - 41 U/L   ALT 13 (L) 14 - 54 U/L   Alkaline Phosphatase 28 (L) 38 - 126 U/L   Total Bilirubin 0.3 0.3 - 1.2 mg/dL   GFR calc non Af Amer >60 >60 mL/min   GFR calc Af Amer >60 >60 mL/min   Anion gap 6 5 - 15  Troponin I     Status: None   Collection Time: 10/06/15  9:14 PM  Result Value Ref Range   Troponin I <0.03 <0.03 ng/mL    MAU Course  Procedures  MDM Patient has had GI cocktail and fioricet  She reports that her pain is better. She is resting comfortably in the room  Assessment and Plan   1. Gastroesophageal reflux disease, esophagitis presence not specified   2. [redacted] weeks gestation of pregnancy    DC home Comfort measures reviewed  2nd Trimester precautions  RX: protonix 20mg  QD #30 with 1RF  Return to MAU as needed FU with OB as planned  Follow-up  Information    [facility] .   Contact information: [address] [place] [id number] [phone]            Tawnya CrookHogan, Jadwiga Faidley Donovan 10/06/2015, 11:56 PM

## 2015-10-06 NOTE — MAU Note (Signed)
PT SAYS YESTERDAY  AT 4PM   SHE WAS BOYFRIEND'S HOUSE-    FELT TIRED-   SHE FELL  AND PASSED OUT- SHE THINKS- HE PICKED HER UP-      THEN SHE FELT CHILLS.  THEN TODAY      SHE FELT H/A-    STARTED AT 1PM,     THEN HER  LEFT ARM  HURT AND TINGLES- AND FEELS  SAME  NOW-      NO MEDS FOR H/A.      THEN AT 5PM TONIGHT - FELT CHILLS   AND SWEATY   AND FEELS THAT  NOW.    THEN   AT 8PM-   PAIN  BETWEEN   HER  BREAST    .    [facility]-   APPOINTMENT  WITH  DR NEWTON ON 9-20.     LAST SEX-    Tuesday.      TAKES ANTIDEPRESSANT -

## 2015-10-06 NOTE — Discharge Instructions (Signed)
Food Choices for Gastroesophageal Reflux Disease, Adult °When you have gastroesophageal reflux disease (GERD), the foods you eat and your eating habits are very important. Choosing the right foods can help ease the discomfort of GERD. °WHAT GENERAL GUIDELINES DO I NEED TO FOLLOW? °· Choose fruits, vegetables, whole grains, low-fat dairy products, and low-fat meat, fish, and poultry. °· Limit fats such as oils, salad dressings, butter, nuts, and avocado. °· Keep a food diary to identify foods that cause symptoms. °· Avoid foods that cause reflux. These may be different for different people. °· Eat frequent small meals instead of three large meals each day. °· Eat your meals slowly, in a relaxed setting. °· Limit fried foods. °· Cook foods using methods other than frying. °· Avoid drinking alcohol. °· Avoid drinking large amounts of liquids with your meals. °· Avoid bending over or lying down until 2-3 hours after eating. °WHAT FOODS ARE NOT RECOMMENDED? °The following are some foods and drinks that may worsen your symptoms: °Vegetables °Tomatoes. Tomato juice. Tomato and spaghetti sauce. Chili peppers. Onion and garlic. Horseradish. °Fruits °Oranges, grapefruit, and lemon (fruit and juice). °Meats °High-fat meats, fish, and poultry. This includes hot dogs, ribs, ham, sausage, salami, and bacon. °Dairy °Whole milk and chocolate milk. Sour cream. Cream. Butter. Ice cream. Cream cheese.  °Beverages °Coffee and tea, with or without caffeine. Carbonated beverages or energy drinks. °Condiments °Hot sauce. Barbecue sauce.  °Sweets/Desserts °Chocolate and cocoa. Donuts. Peppermint and spearmint. °Fats and Oils °High-fat foods, including French fries and potato chips. °Other °Vinegar. Strong spices, such as black pepper, white pepper, red pepper, cayenne, curry powder, cloves, ginger, and chili powder. °The items listed above may not be a complete list of foods and beverages to avoid. Contact your dietitian for more  information. °  °This information is not intended to replace advice given to you by your health care provider. Make sure you discuss any questions you have with your health care provider. °  °Document Released: 01/22/2005 Document Revised: 02/12/2014 Document Reviewed: 11/26/2012 °Elsevier Interactive Patient Education ©2016 [employer] Inc[employer] ° °Gastroesophageal Reflux Disease, Adult °Normally, food travels down the esophagus and stays in the stomach to be digested. However, when a person has gastroesophageal reflux disease (GERD), food and stomach acid move back up into the esophagus. When this happens, the esophagus becomes sore and inflamed. Over time, GERD can create small holes (ulcers) in the lining of the esophagus.  °CAUSES °This condition is caused by a problem with the muscle between the esophagus and the stomach (lower esophageal sphincter, or LES). Normally, the LES muscle closes after food passes through the esophagus to the stomach. When the LES is weakened or abnormal, it does not close properly, and that allows food and stomach acid to go back up into the esophagus. The LES can be weakened by certain dietary substances, medicines, and medical conditions, including: °· Tobacco use. °· Pregnancy. °· Having a hiatal hernia. °· Heavy alcohol use. °· Certain foods and beverages, such as coffee, chocolate, onions, and peppermint. °RISK FACTORS °This condition is more likely to develop in: °· People who have an increased body weight. °· People who have connective tissue disorders. °· People who use NSAID medicines. °SYMPTOMS °Symptoms of this condition include: °· Heartburn. °· Difficult or painful swallowing. °· The feeling of having a lump in the throat. °· A bitter taste in the mouth. °· Bad breath. °· Having a large amount of saliva. °· Having an upset or bloated stomach. °· Belching. °· Chest pain. °·   Shortness of breath or wheezing. °· Ongoing (chronic) cough or a night-time cough. °· Wearing away of  tooth enamel. °· Weight loss. °Different conditions can cause chest pain. Make sure to see your health care provider if you experience chest pain. °DIAGNOSIS °Your health care provider will take a medical history and perform a physical exam. To determine if you have mild or severe GERD, your health care provider may also monitor how you respond to treatment. You may also have other tests, including: °· An endoscopy to examine your stomach and esophagus with a small camera. °· A test that measures the acidity level in your esophagus. °· A test that measures how much pressure is on your esophagus. °· A barium swallow or modified barium swallow to show the shape, size, and functioning of your esophagus. °TREATMENT °The goal of treatment is to help relieve your symptoms and to prevent complications. Treatment for this condition may vary depending on how severe your symptoms are. Your health care provider may recommend: °· Changes to your diet. °· Medicine. °· Surgery. °HOME CARE INSTRUCTIONS °Diet °· Follow a diet as recommended by your health care provider. This may involve avoiding foods and drinks such as: °¨ Coffee and tea (with or without caffeine). °¨ Drinks that contain alcohol. °¨ Energy drinks and sports drinks. °¨ Carbonated drinks or sodas. °¨ Chocolate and cocoa. °¨ Peppermint and mint flavorings. °¨ Garlic and onions. °¨ Horseradish. °¨ Spicy and acidic foods, including peppers, chili powder, curry powder, vinegar, hot sauces, and barbecue sauce. °¨ Citrus fruit juices and citrus fruits, such as oranges, lemons, and limes. °¨ Tomato-based foods, such as red sauce, chili, salsa, and pizza with red sauce. °¨ Fried and fatty foods, such as donuts, french fries, potato chips, and high-fat dressings. °¨ High-fat meats, such as hot dogs and fatty cuts of red and white meats, such as rib eye steak, sausage, ham, and bacon. °¨ High-fat dairy items, such as whole milk, butter, and cream cheese. °· Eat small,  frequent meals instead of large meals. °· Avoid drinking large amounts of liquid with your meals. °· Avoid eating meals during the 2-3 hours before bedtime. °· Avoid lying down right after you eat. °· Do not exercise right after you eat. ° General Instructions  °· Pay attention to any changes in your symptoms. °· Take over-the-counter and prescription medicines only as told by your health care provider. Do not take aspirin, ibuprofen, or other NSAIDs unless your health care provider told you to do so. °· Do not use any tobacco products, including cigarettes, chewing tobacco, and e-cigarettes. If you need help quitting, ask your health care provider. °· Wear loose-fitting clothing. Do not wear anything tight around your waist that causes pressure on your abdomen. °· Raise (elevate) the head of your bed 6 inches (15cm). °· Try to reduce your stress, such as with yoga or meditation. If you need help reducing stress, ask your health care provider. °· If you are overweight, reduce your weight to an amount that is healthy for you. Ask your health care provider for guidance about a safe weight loss goal. °· Keep all follow-up visits as told by your health care provider. This is important. °SEEK MEDICAL CARE IF: °· You have new symptoms. °· You have unexplained weight loss. °· You have difficulty swallowing, or it hurts to swallow. °· You have wheezing or a persistent cough. °· Your symptoms do not improve with treatment. °· You have a hoarse voice. °SEEK IMMEDIATE MEDICAL CARE IF: °· You have pain   in your arms, neck, jaw, teeth, or back. °· You feel sweaty, dizzy, or light-headed. °· You have chest pain or shortness of breath. °· You vomit and your vomit looks like blood or coffee grounds. °· You faint. °· Your stool is bloody or black. °· You cannot swallow, drink, or eat. °  °This information is not intended to replace advice given to you by your health care provider. Make sure you discuss any questions you have with  your health care provider. °  °Document Released: 11/01/2004 Document Revised: 10/13/2014 Document Reviewed: 05/19/2014 °Elsevier Interactive Patient Education ©2016 [employer]. ° °

## 2015-10-22 LAB — LAB REPORT - SCANNED

## 2015-10-26 ENCOUNTER — Encounter: Payer: Medicaid Other | Admitting: Family Medicine

## 2015-10-27 ENCOUNTER — Encounter: Payer: Self-pay | Admitting: Obstetrics and Gynecology

## 2015-10-27 ENCOUNTER — Ambulatory Visit (INDEPENDENT_AMBULATORY_CARE_PROVIDER_SITE_OTHER): Payer: Medicaid Other | Admitting: Obstetrics and Gynecology

## 2015-10-27 DIAGNOSIS — Z3402 Encounter for supervision of normal first pregnancy, second trimester: Secondary | ICD-10-CM | POA: Diagnosis not present

## 2015-10-27 DIAGNOSIS — Z3480 Encounter for supervision of other normal pregnancy, unspecified trimester: Secondary | ICD-10-CM | POA: Insufficient documentation

## 2015-10-27 NOTE — Addendum Note (Signed)
Addended by: Catalina AntiguaONSTANT, Paxson Harrower on: 10/27/2015 03:30 PM   Modules accepted: Orders

## 2015-10-27 NOTE — Progress Notes (Signed)
  Subjective:    Joy Rivera is a [id number] being seen today for her first obstetrical visit.  Her obstetrical history is significant for previous normal pregnancy. Patient does intend to breast feed. Pregnancy history fully reviewed.  Patient reports no complaints.  Vitals:   10/27/15 1428  BP: 107/73  Pulse: (!) 102  Temp: 98.9 F (37.2 C)  Weight: 175 lb 9.6 oz (79.7 kg)    HISTORY: OB History  Gravida Para Term Preterm AB Living  5 1 1  0 3 1  SAB TAB Ectopic Multiple Live Births  0 2 1 0 1    # Outcome Date GA Lbr Len/2nd Weight Sex Delivery Anes PTL Lv  5 Current           4 Term 11/12/12 [id number] 21:40 / 01:08 7 lb 8.1 oz (3.405 kg) M Vag-Spont EPI  LIV  3 Ectopic           2 TAB           1 TAB              Past Medical History:  Diagnosis Date  . Abnormal Pap smear   . Anxiety   . Depression   . Medical history non-contributory    Past Surgical History:  Procedure Laterality Date  . NO PAST SURGERIES     Family History  Problem Relation Age of Onset  . Hypertension Father   . Heart disease Father   . Cancer Maternal Aunt   . Cancer Maternal Uncle   . Hypertension Paternal Grandmother   . Cancer Maternal Grandmother   . Cancer Maternal Grandfather      Exam    Uterus:   16-week in size  Pelvic Exam:    Perineum: Normal Perineum   Vulva: normal   Vagina:  normal mucosa, normal discharge   pH:    Cervix: multiparous appearance and cervix is closed and long   Adnexa: normal adnexa and no mass, fullness, tenderness   Bony Pelvis: gynecoid  System: Breast:  normal appearance, no masses or tenderness   Skin: normal coloration and turgor, no rashes    Neurologic: oriented, no focal deficits   Extremities: normal strength, tone, and muscle mass   HEENT extra ocular movement intact   Mouth/Teeth mucous membranes moist, pharynx normal without lesions and dental hygiene good   Neck supple and no masses   Cardiovascular: regular rate and  rhythm   Respiratory:  chest clear, no wheezing, crepitations, rhonchi, normal symmetric air entry   Abdomen: soft, non-tender; bowel sounds normal; no masses,  no organomegaly   Urinary:       Assessment:    Pregnancy: [id number] Patient Active Problem List   Diagnosis Date Noted  . Supervision of normal first pregnancy, antepartum 10/27/2015        Plan:     Initial labs drawn. Prenatal vitamins. Problem list reviewed and updated. Genetic Screening discussed Quad Screen: ordered.  Ultrasound discussed; fetal survey: ordered.  Follow up in 4 weeks. 50% of 30 min visit spent on counseling and coordination of care.     Grizelda Piscopo 10/27/2015

## 2015-10-27 NOTE — Progress Notes (Signed)
Patient is in office for initial ob visit and states that she has had a cough for about 2 weeks with a sore throat that comes and goes. Patient stated that she has started to feel flutter fetal movements.

## 2015-10-27 NOTE — Patient Instructions (Signed)
Second Trimester of Pregnancy The second trimester is from week 13 through week 28, months 4 through 6. The second trimester is often a time when you feel your best. Your body has also adjusted to being pregnant, and you begin to feel better physically. Usually, morning sickness has lessened or quit completely, you may have more energy, and you may have an increase in appetite. The second trimester is also a time when the fetus is growing rapidly. At the end of the sixth month, the fetus is about 9 inches long and weighs about 1 pounds. You will likely begin to feel the baby move (quickening) between 18 and 20 weeks of the pregnancy. BODY CHANGES Your body goes through many changes during pregnancy. The changes vary from woman to woman.   Your weight will continue to increase. You will notice your lower abdomen bulging out.  You may begin to get stretch marks on your hips, abdomen, and breasts.  You may develop headaches that can be relieved by medicines approved by your health care provider.  You may urinate more often because the fetus is pressing on your bladder.  You may develop or continue to have heartburn as a result of your pregnancy.  You may develop constipation because certain hormones are causing the muscles that push waste through your intestines to slow down.  You may develop hemorrhoids or swollen, bulging veins (varicose veins).  You may have back pain because of the weight gain and pregnancy hormones relaxing your joints between the bones in your pelvis and as a result of a shift in weight and the muscles that support your balance.  Your breasts will continue to grow and be tender.  Your gums may bleed and may be sensitive to brushing and flossing.  Dark spots or blotches (chloasma, mask of pregnancy) may develop on your face. This will likely fade after the baby is born.  A dark line from your belly button to the pubic area (linea nigra) may appear. This will likely  fade after the baby is born.  You may have changes in your hair. These can include thickening of your hair, rapid growth, and changes in texture. Some women also have hair loss during or after pregnancy, or hair that feels dry or thin. Your hair will most likely return to normal after your baby is born. WHAT TO EXPECT AT YOUR PRENATAL VISITS During a routine prenatal visit:  You will be weighed to make sure you and the fetus are growing normally.  Your blood pressure will be taken.  Your abdomen will be measured to track your baby's growth.  The fetal heartbeat will be listened to.  Any test results from the previous visit will be discussed. Your health care provider may ask you:  How you are feeling.  If you are feeling the baby move.  If you have had any abnormal symptoms, such as leaking fluid, bleeding, severe headaches, or abdominal cramping.  If you are using any tobacco products, including cigarettes, chewing tobacco, and electronic cigarettes.  If you have any questions. Other tests that may be performed during your second trimester include:  Blood tests that check for:  Low iron levels (anemia).  Gestational diabetes (between 24 and 28 weeks).  Rh antibodies.  Urine tests to check for infections, diabetes, or protein in the urine.  An ultrasound to confirm the proper growth and development of the baby.  An amniocentesis to check for possible genetic problems.  Fetal screens for spina bifida   and Down syndrome.  HIV (human immunodeficiency virus) testing. Routine prenatal testing includes screening for HIV, unless you choose not to have this test. HOME CARE INSTRUCTIONS   Avoid all smoking, herbs, alcohol, and unprescribed drugs. These chemicals affect the formation and growth of the baby.  Do not use any tobacco products, including cigarettes, chewing tobacco, and electronic cigarettes. If you need help quitting, ask your health care provider. You may receive  counseling support and other resources to help you quit.  Follow your health care provider's instructions regarding medicine use. There are medicines that are either safe or unsafe to take during pregnancy.  Exercise only as directed by your health care provider. Experiencing uterine cramps is a good sign to stop exercising.  Continue to eat regular, healthy meals.  Wear a good support bra for breast tenderness.  Do not use hot tubs, steam rooms, or saunas.  Wear your seat belt at all times when driving.  Avoid raw meat, uncooked cheese, cat litter boxes, and soil used by cats. These carry germs that can cause birth defects in the baby.  Take your prenatal vitamins.  Take 1500-2000 mg of calcium daily starting at the 20th week of pregnancy until you deliver your baby.  Try taking a stool softener (if your health care provider approves) if you develop constipation. Eat more high-fiber foods, such as fresh vegetables or fruit and whole grains. Drink plenty of fluids to keep your urine clear or pale yellow.  Take warm sitz baths to soothe any pain or discomfort caused by hemorrhoids. Use hemorrhoid cream if your health care provider approves.  If you develop varicose veins, wear support hose. Elevate your feet for 15 minutes, 3-4 times a day. Limit salt in your diet.  Avoid heavy lifting, wear low heel shoes, and practice good posture.  Rest with your legs elevated if you have leg cramps or low back pain.  Visit your dentist if you have not gone yet during your pregnancy. Use a soft toothbrush to brush your teeth and be gentle when you floss.  A sexual relationship may be continued unless your health care provider directs you otherwise.  Continue to go to all your prenatal visits as directed by your health care provider. SEEK MEDICAL CARE IF:   You have dizziness.  You have mild pelvic cramps, pelvic pressure, or nagging pain in the abdominal area.  You have persistent nausea,  vomiting, or diarrhea.  You have a bad smelling vaginal discharge.  You have pain with urination. SEEK IMMEDIATE MEDICAL CARE IF:   You have a fever.  You are leaking fluid from your vagina.  You have spotting or bleeding from your vagina.  You have severe abdominal cramping or pain.  You have rapid weight gain or loss.  You have shortness of breath with chest pain.  You notice sudden or extreme swelling of your face, hands, ankles, feet, or legs.  You have not felt your baby move in over an hour.  You have severe headaches that do not go away with medicine.  You have vision changes.   This information is not intended to replace advice given to you by your health care provider. Make sure you discuss any questions you have with your health care provider.   Document Released: 01/16/2001 Document Revised: 02/12/2014 Document Reviewed: 03/25/2012 Elsevier Interactive Patient Education 2016 [employer]  Contraception Choices Contraception (birth control) is the use of any methods or devices to prevent pregnancy. Below are some methods to   help avoid pregnancy. HORMONAL METHODS   Contraceptive implant. This is a thin, plastic tube containing progesterone hormone. It does not contain estrogen hormone. Your health care provider inserts the tube in the inner part of the upper arm. The tube can remain in place for up to 3 years. After 3 years, the implant must be removed. The implant prevents the ovaries from releasing an egg (ovulation), thickens the cervical mucus to prevent sperm from entering the uterus, and thins the lining of the inside of the uterus.  Progesterone-only injections. These injections are given every 3 months by your health care provider to prevent pregnancy. This synthetic progesterone hormone stops the ovaries from releasing eggs. It also thickens cervical mucus and changes the uterine lining. This makes it harder for sperm to survive in the uterus.  Birth  control pills. These pills contain estrogen and progesterone hormone. They work by preventing the ovaries from releasing eggs (ovulation). They also cause the cervical mucus to thicken, preventing the sperm from entering the uterus. Birth control pills are prescribed by a health care provider.Birth control pills can also be used to treat heavy periods.  Minipill. This type of birth control pill contains only the progesterone hormone. They are taken every day of each month and must be prescribed by your health care provider.  Birth control patch. The patch contains hormones similar to those in birth control pills. It must be changed once a week and is prescribed by a health care provider.  Vaginal ring. The ring contains hormones similar to those in birth control pills. It is left in the vagina for 3 weeks, removed for 1 week, and then a new one is put back in place. The patient must be comfortable inserting and removing the ring from the vagina.A health care provider's prescription is necessary.  Emergency contraception. Emergency contraceptives prevent pregnancy after unprotected sexual intercourse. This pill can be taken right after sex or up to 5 days after unprotected sex. It is most effective the sooner you take the pills after having sexual intercourse. Most emergency contraceptive pills are available without a prescription. Check with your pharmacist. Do not use emergency contraception as your only form of birth control. BARRIER METHODS   Female condom. This is a thin sheath (latex or rubber) that is worn over the penis during sexual intercourse. It can be used with spermicide to increase effectiveness.  Female condom. This is a soft, loose-fitting sheath that is put into the vagina before sexual intercourse.  Diaphragm. This is a soft, latex, dome-shaped barrier that must be fitted by a health care provider. It is inserted into the vagina, along with a spermicidal jelly. It is inserted before  intercourse. The diaphragm should be left in the vagina for 6 to 8 hours after intercourse.  Cervical cap. This is a round, soft, latex or plastic cup that fits over the cervix and must be fitted by a health care provider. The cap can be left in place for up to 48 hours after intercourse.  Sponge. This is a soft, circular piece of polyurethane foam. The sponge has spermicide in it. It is inserted into the vagina after wetting it and before sexual intercourse.  Spermicides. These are chemicals that kill or block sperm from entering the cervix and uterus. They come in the form of creams, jellies, suppositories, foam, or tablets. They do not require a prescription. They are inserted into the vagina with an applicator before having sexual intercourse. The process must be repeated every   time you have sexual intercourse. INTRAUTERINE CONTRACEPTION  Intrauterine device (IUD). This is a T-shaped device that is put in a woman's uterus during a menstrual period to prevent pregnancy. There are 2 types:  Copper IUD. This type of IUD is wrapped in copper wire and is placed inside the uterus. Copper makes the uterus and fallopian tubes produce a fluid that kills sperm. It can stay in place for 10 years.  Hormone IUD. This type of IUD contains the hormone progestin (synthetic progesterone). The hormone thickens the cervical mucus and prevents sperm from entering the uterus, and it also thins the uterine lining to prevent implantation of a fertilized egg. The hormone can weaken or kill the sperm that get into the uterus. It can stay in place for 3-5 years, depending on which type of IUD is used. PERMANENT METHODS OF CONTRACEPTION  Female tubal ligation. This is when the woman's fallopian tubes are surgically sealed, tied, or blocked to prevent the egg from traveling to the uterus.  Hysteroscopic sterilization. This involves placing a small coil or insert into each fallopian tube. Your doctor uses a technique  called hysteroscopy to do the procedure. The device causes scar tissue to form. This results in permanent blockage of the fallopian tubes, so the sperm cannot fertilize the egg. It takes about 3 months after the procedure for the tubes to become blocked. You must use another form of birth control for these 3 months.  Female sterilization. This is when the female has the tubes that carry sperm tied off (vasectomy).This blocks sperm from entering the vagina during sexual intercourse. After the procedure, the man can still ejaculate fluid (semen). NATURAL PLANNING METHODS  Natural family planning. This is not having sexual intercourse or using a barrier method (condom, diaphragm, cervical cap) on days the woman could become pregnant.  Calendar method. This is keeping track of the length of each menstrual cycle and identifying when you are fertile.  Ovulation method. This is avoiding sexual intercourse during ovulation.  Symptothermal method. This is avoiding sexual intercourse during ovulation, using a thermometer and ovulation symptoms.  Post-ovulation method. This is timing sexual intercourse after you have ovulated. Regardless of which type or method of contraception you choose, it is important that you use condoms to protect against the transmission of sexually transmitted infections (STIs). Talk with your health care provider about which form of contraception is most appropriate for you.   This information is not intended to replace advice given to you by your health care provider. Make sure you discuss any questions you have with your health care provider.   Document Released: 01/22/2005 Document Revised: 01/27/2013 Document Reviewed: 07/17/2012 Elsevier Interactive Patient Education 2016 [employer]  Breastfeeding Deciding to breastfeed is one of the best choices you can make for you and your baby. A change in hormones during pregnancy causes your breast tissue to grow and increases the  number and size of your milk ducts. These hormones also allow proteins, sugars, and fats from your blood supply to make breast milk in your milk-producing glands. Hormones prevent breast milk from being released before your baby is born as well as prompt milk flow after birth. Once breastfeeding has begun, thoughts of your baby, as well as his or her sucking or crying, can stimulate the release of milk from your milk-producing glands.  BENEFITS OF BREASTFEEDING For Your Baby  Your first milk (colostrum) helps your baby's digestive system function better.  There are antibodies in your milk that help   your baby fight off infections.  Your baby has a lower incidence of asthma, allergies, and sudden infant death syndrome.  The nutrients in breast milk are better for your baby than infant formulas and are designed uniquely for your baby's needs.  Breast milk improves your baby's brain development.  Your baby is less likely to develop other conditions, such as childhood obesity, asthma, or type 2 diabetes mellitus. For You  Breastfeeding helps to create a very special bond between you and your baby.  Breastfeeding is convenient. Breast milk is always available at the correct temperature and costs nothing.  Breastfeeding helps to burn calories and helps you lose the weight gained during pregnancy.  Breastfeeding makes your uterus contract to its prepregnancy size faster and slows bleeding (lochia) after you give birth.   Breastfeeding helps to lower your risk of developing type 2 diabetes mellitus, osteoporosis, and breast or ovarian cancer later in life. SIGNS THAT YOUR BABY IS HUNGRY Early Signs of Hunger  Increased alertness or activity.  Stretching.  Movement of the head from side to side.  Movement of the head and opening of the mouth when the corner of the mouth or cheek is stroked (rooting).  Increased sucking sounds, smacking lips, cooing, sighing, or squeaking.  Hand-to-mouth  movements.  Increased sucking of fingers or hands. Late Signs of Hunger  Fussing.  Intermittent crying. Extreme Signs of Hunger Signs of extreme hunger will require calming and consoling before your baby will be able to breastfeed successfully. Do not wait for the following signs of extreme hunger to occur before you initiate breastfeeding:  Restlessness.  A loud, strong cry.  Screaming. BREASTFEEDING BASICS Breastfeeding Initiation  Find a comfortable place to sit or lie down, with your neck and back well supported.  Place a pillow or rolled up blanket under your baby to bring him or her to the level of your breast (if you are seated). Nursing pillows are specially designed to help support your arms and your baby while you breastfeed.  Make sure that your baby's abdomen is facing your abdomen.  Gently massage your breast. With your fingertips, massage from your chest wall toward your nipple in a circular motion. This encourages milk flow. You may need to continue this action during the feeding if your milk flows slowly.  Support your breast with 4 fingers underneath and your thumb above your nipple. Make sure your fingers are well away from your nipple and your baby's mouth.  Stroke your baby's lips gently with your finger or nipple.  When your baby's mouth is open wide enough, quickly bring your baby to your breast, placing your entire nipple and as much of the colored area around your nipple (areola) as possible into your baby's mouth.  More areola should be visible above your baby's upper lip than below the lower lip.  Your baby's tongue should be between his or her lower gum and your breast.  Ensure that your baby's mouth is correctly positioned around your nipple (latched). Your baby's lips should create a seal on your breast and be turned out (everted).  It is common for your baby to suck about 2-3 minutes in order to start the flow of breast milk. Latching Teaching  your baby how to latch on to your breast properly is very important. An improper latch can cause nipple pain and decreased milk supply for you and poor weight gain in your baby. Also, if your baby is not latched onto your nipple properly, he   or she may swallow some air during feeding. This can make your baby fussy. Burping your baby when you switch breasts during the feeding can help to get rid of the air. However, teaching your baby to latch on properly is still the best way to prevent fussiness from swallowing air while breastfeeding. Signs that your baby has successfully latched on to your nipple:  Silent tugging or silent sucking, without causing you pain.  Swallowing heard between every 3-4 sucks.  Muscle movement above and in front of his or her ears while sucking. Signs that your baby has not successfully latched on to nipple:  Sucking sounds or smacking sounds from your baby while breastfeeding.  Nipple pain. If you think your baby has not latched on correctly, slip your finger into the corner of your baby's mouth to break the suction and place it between your baby's gums. Attempt breastfeeding initiation again. Signs of Successful Breastfeeding Signs from your baby:  A gradual decrease in the number of sucks or complete cessation of sucking.  Falling asleep.  Relaxation of his or her body.  Retention of a small amount of milk in his or her mouth.  Letting go of your breast by himself or herself. Signs from you:  Breasts that have increased in firmness, weight, and size 1-3 hours after feeding.  Breasts that are softer immediately after breastfeeding.  Increased milk volume, as well as a change in milk consistency and color by the fifth day of breastfeeding.  Nipples that are not sore, cracked, or bleeding. Signs That Your Baby is Getting Enough Milk  Wetting at least 3 diapers in a 24-hour period. The urine should be clear and pale yellow by age 5 days.  At least 3  stools in a 24-hour period by age 5 days. The stool should be soft and yellow.  At least 3 stools in a 24-hour period by age 7 days. The stool should be seedy and yellow.  No loss of weight greater than 10% of birth weight during the first 3 days of age.  Average weight gain of 4-7 ounces (113-198 g) per week after age 4 days.  Consistent daily weight gain by age 5 days, without weight loss after the age of 2 weeks. After a feeding, your baby may spit up a small amount. This is common. BREASTFEEDING FREQUENCY AND DURATION Frequent feeding will help you make more milk and can prevent sore nipples and breast engorgement. Breastfeed when you feel the need to reduce the fullness of your breasts or when your baby shows signs of hunger. This is called "breastfeeding on demand." Avoid introducing a pacifier to your baby while you are working to establish breastfeeding (the first 4-6 weeks after your baby is born). After this time you may choose to use a pacifier. Research has shown that pacifier use during the first year of a baby's life decreases the risk of sudden infant death syndrome (SIDS). Allow your baby to feed on each breast as long as he or she wants. Breastfeed until your baby is finished feeding. When your baby unlatches or falls asleep while feeding from the first breast, offer the second breast. Because newborns are often sleepy in the first few weeks of life, you may need to awaken your baby to get him or her to feed. Breastfeeding times will vary from baby to baby. However, the following rules can serve as a guide to help you ensure that your baby is properly fed:  Newborns (babies 4 weeks   of age or younger) may breastfeed every 1-3 hours.  Newborns should not go longer than 3 hours during the day or 5 hours during the night without breastfeeding.  You should breastfeed your baby a minimum of 8 times in a 24-hour period until you begin to introduce solid foods to your baby at around 6  months of age. BREAST MILK PUMPING Pumping and storing breast milk allows you to ensure that your baby is exclusively fed your breast milk, even at times when you are unable to breastfeed. This is especially important if you are going back to work while you are still breastfeeding or when you are not able to be present during feedings. Your lactation consultant can give you guidelines on how long it is safe to store breast milk. A breast pump is a machine that allows you to pump milk from your breast into a sterile bottle. The pumped breast milk can then be stored in a refrigerator or freezer. Some breast pumps are operated by hand, while others use electricity. Ask your lactation consultant which type will work best for you. Breast pumps can be purchased, but some hospitals and breastfeeding support groups lease breast pumps on a monthly basis. A lactation consultant can teach you how to hand express breast milk, if you prefer not to use a pump. CARING FOR YOUR BREASTS WHILE YOU BREASTFEED Nipples can become dry, cracked, and sore while breastfeeding. The following recommendations can help keep your breasts moisturized and healthy:  Avoid using soap on your nipples.  Wear a supportive bra. Although not required, special nursing bras and tank tops are designed to allow access to your breasts for breastfeeding without taking off your entire bra or top. Avoid wearing underwire-style bras or extremely tight bras.  Air dry your nipples for 3-4minutes after each feeding.  Use only cotton bra pads to absorb leaked breast milk. Leaking of breast milk between feedings is normal.  Use lanolin on your nipples after breastfeeding. Lanolin helps to maintain your skin's normal moisture barrier. If you use pure lanolin, you do not need to wash it off before feeding your baby again. Pure lanolin is not toxic to your baby. You may also hand express a few drops of breast milk and gently massage that milk into your  nipples and allow the milk to air dry. In the first few weeks after giving birth, some women experience extremely full breasts (engorgement). Engorgement can make your breasts feel heavy, warm, and tender to the touch. Engorgement peaks within 3-5 days after you give birth. The following recommendations can help ease engorgement:  Completely empty your breasts while breastfeeding or pumping. You may want to start by applying warm, moist heat (in the shower or with warm water-soaked hand towels) just before feeding or pumping. This increases circulation and helps the milk flow. If your baby does not completely empty your breasts while breastfeeding, pump any extra milk after he or she is finished.  Wear a snug bra (nursing or regular) or tank top for 1-2 days to signal your body to slightly decrease milk production.  Apply ice packs to your breasts, unless this is too uncomfortable for you.  Make sure that your baby is latched on and positioned properly while breastfeeding. If engorgement persists after 48 hours of following these recommendations, contact your health care provider or a lactation consultant. OVERALL HEALTH CARE RECOMMENDATIONS WHILE BREASTFEEDING  Eat healthy foods. Alternate between meals and snacks, eating 3 of each per day. Because what   you eat affects your breast milk, some of the foods may make your baby more irritable than usual. Avoid eating these foods if you are sure that they are negatively affecting your baby.  Drink milk, fruit juice, and water to satisfy your thirst (about 10 glasses a day).  Rest often, relax, and continue to take your prenatal vitamins to prevent fatigue, stress, and anemia.  Continue breast self-awareness checks.  Avoid chewing and smoking tobacco. Chemicals from cigarettes that pass into breast milk and exposure to secondhand smoke may harm your baby.  Avoid alcohol and drug use, including marijuana. Some medicines that may be harmful to your  baby can pass through breast milk. It is important to ask your health care provider before taking any medicine, including all over-the-counter and prescription medicine as well as vitamin and herbal supplements. It is possible to become pregnant while breastfeeding. If birth control is desired, ask your health care provider about options that will be safe for your baby. SEEK MEDICAL CARE IF:  You feel like you want to stop breastfeeding or have become frustrated with breastfeeding.  You have painful breasts or nipples.  Your nipples are cracked or bleeding.  Your breasts are red, tender, or warm.  You have a swollen area on either breast.  You have a fever or chills.  You have nausea or vomiting.  You have drainage other than breast milk from your nipples.  Your breasts do not become full before feedings by the fifth day after you give birth.  You feel sad and depressed.  Your baby is too sleepy to eat well.  Your baby is having trouble sleeping.   Your baby is wetting less than 3 diapers in a 24-hour period.  Your baby has less than 3 stools in a 24-hour period.  Your baby's skin or the white part of his or her eyes becomes yellow.   Your baby is not gaining weight by 5 days of age. SEEK IMMEDIATE MEDICAL CARE IF:  Your baby is overly tired (lethargic) and does not want to wake up and feed.  Your baby develops an unexplained fever.   This information is not intended to replace advice given to you by your health care provider. Make sure you discuss any questions you have with your health care provider.   Document Released: 01/22/2005 Document Revised: 10/13/2014 Document Reviewed: 07/16/2012 Elsevier Interactive Patient Education 2016 [employer]vier Inc.  

## 2015-10-29 LAB — GC/CHLAMYDIA PROBE AMP
CHLAMYDIA, DNA PROBE: NEGATIVE
Neisseria gonorrhoeae by PCR: NEGATIVE

## 2015-10-29 LAB — CULTURE, OB URINE

## 2015-10-29 LAB — URINE CULTURE, OB REFLEX

## 2015-10-31 ENCOUNTER — Encounter: Payer: Self-pay | Admitting: Obstetrics and Gynecology

## 2015-10-31 DIAGNOSIS — O09899 Supervision of other high risk pregnancies, unspecified trimester: Secondary | ICD-10-CM | POA: Insufficient documentation

## 2015-10-31 DIAGNOSIS — O9989 Other specified diseases and conditions complicating pregnancy, childbirth and the puerperium: Secondary | ICD-10-CM

## 2015-10-31 DIAGNOSIS — Z283 Underimmunization status: Secondary | ICD-10-CM | POA: Insufficient documentation

## 2015-11-02 LAB — PAP IG W/ RFLX HPV ASCU: PAP SMEAR COMMENT: 0

## 2015-11-03 ENCOUNTER — Telehealth: Payer: Self-pay | Admitting: *Deleted

## 2015-11-03 NOTE — Telephone Encounter (Signed)
Tried to return call voicemail not accepting calls.Patient gave a second number left message on that line.

## 2015-11-23 LAB — PRENATAL PROFILE I(LABCORP)
Antibody Screen: NEGATIVE
BASOS ABS: 0 10*3/uL (ref 0.0–0.2)
Basos: 1 %
EOS (ABSOLUTE): 0.3 10*3/uL (ref 0.0–0.4)
Eos: 4 %
HEP B S AG: NEGATIVE
Hematocrit: 38.1 % (ref 34.0–46.6)
Hemoglobin: 12.6 g/dL (ref 11.1–15.9)
IMMATURE GRANS (ABS): 0 10*3/uL (ref 0.0–0.1)
Immature Granulocytes: 0 %
LYMPHS ABS: 1.7 10*3/uL (ref 0.7–3.1)
Lymphs: 26 %
MCH: 28.4 pg (ref 26.6–33.0)
MCHC: 33.1 g/dL (ref 31.5–35.7)
MCV: 86 fL (ref 79–97)
MONOS ABS: 0.4 10*3/uL (ref 0.1–0.9)
Monocytes: 7 %
NEUTROS PCT: 62 %
Neutrophils Absolute: 4.2 10*3/uL (ref 1.4–7.0)
Platelets: 263 10*3/uL (ref 150–379)
RBC: 4.43 x10E6/uL (ref 3.77–5.28)
RDW: 13.9 % (ref 12.3–15.4)
RH TYPE: POSITIVE
RPR Ser Ql: NONREACTIVE
WBC: 6.6 10*3/uL (ref 3.4–10.8)

## 2015-11-23 LAB — AFP, QUAD SCREEN
DIA Mom Value: 0.94
DIA Value (EIA): 164.14 pg/mL
DSR (BY AGE) 1 IN: 1056
DSR (SECOND TRIMESTER) 1 IN: 7156
Gestational Age: 15 WEEKS
MATERNAL AGE AT EDD: 24.4 a
MSAFP MOM: 0.85
MSAFP: 18.8 ng/mL
MSHCG Mom: 0.88
MSHCG: 42622 m[IU]/mL
OSB RISK: 10000
TEST RESULTS AFP: NEGATIVE
WEIGHT: 175 [lb_av]
uE3 Mom: 1.04
uE3 Value: 0.59 ng/mL

## 2015-11-23 LAB — HIV ANTIBODY (ROUTINE TESTING W REFLEX): HIV Screen 4th Generation wRfx: NONREACTIVE

## 2015-11-23 LAB — HEMOGLOBINOPATHY EVALUATION
HEMOGLOBIN F QUANTITATION: 0 % (ref 0.0–2.0)
HGB C: 0 %
HGB S: 0 %
Hemoglobin A2 Quantitation: 2.3 % (ref 0.7–3.1)
Hgb A: 97.7 % (ref 94.0–98.0)

## 2015-11-23 LAB — TOXASSURE SELECT 13 (MW), URINE

## 2015-11-23 LAB — VARICELLA ZOSTER ANTIBODY, IGG: VARICELLA: 816 {index} (ref >165)

## 2015-11-23 LAB — CYSTIC FIBROSIS MUTATION 97: Interpretation: NOT DETECTED

## 2015-11-24 ENCOUNTER — Encounter: Payer: Self-pay | Admitting: Obstetrics and Gynecology

## 2015-12-01 ENCOUNTER — Ambulatory Visit (INDEPENDENT_AMBULATORY_CARE_PROVIDER_SITE_OTHER): Payer: Medicaid Other | Admitting: Obstetrics and Gynecology

## 2015-12-01 ENCOUNTER — Ambulatory Visit (INDEPENDENT_AMBULATORY_CARE_PROVIDER_SITE_OTHER): Payer: Medicaid Other

## 2015-12-01 VITALS — BP 106/72 | HR 72 | Temp 97.6°F | Wt 183.2 lb

## 2015-12-01 DIAGNOSIS — Z3400 Encounter for supervision of normal first pregnancy, unspecified trimester: Secondary | ICD-10-CM

## 2015-12-01 DIAGNOSIS — Z3689 Encounter for other specified antenatal screening: Secondary | ICD-10-CM | POA: Diagnosis not present

## 2015-12-01 DIAGNOSIS — Z23 Encounter for immunization: Secondary | ICD-10-CM

## 2015-12-01 DIAGNOSIS — Z3482 Encounter for supervision of other normal pregnancy, second trimester: Secondary | ICD-10-CM

## 2015-12-01 DIAGNOSIS — O09899 Supervision of other high risk pregnancies, unspecified trimester: Secondary | ICD-10-CM

## 2015-12-01 DIAGNOSIS — Z3402 Encounter for supervision of normal first pregnancy, second trimester: Secondary | ICD-10-CM | POA: Diagnosis not present

## 2015-12-01 DIAGNOSIS — Z283 Underimmunization status: Secondary | ICD-10-CM

## 2015-12-01 DIAGNOSIS — O9989 Other specified diseases and conditions complicating pregnancy, childbirth and the puerperium: Principal | ICD-10-CM

## 2015-12-01 NOTE — Progress Notes (Signed)
Patient is in the office reports occasional uterine irritability, good fetal movement.

## 2015-12-01 NOTE — Patient Instructions (Signed)

## 2015-12-01 NOTE — Progress Notes (Signed)
Subjective:  Joy Rivera is a 25 y.o. [phone] at 1973w5d being seen today for ongoing prenatal care.  She is currently monitored for the following issues for this low-risk pregnancy and has Supervision of normal first pregnancy, antepartum and Rubella non-immune status, antepartum on her problem list.  Patient reports no complaints.  Contractions: Irritability. Vag. Bleeding: None.  Movement: Present. Denies leaking of fluid.   The following portions of the patient's history were reviewed and updated as appropriate: allergies, current medications, past family history, past medical history, past social history, past surgical history and problem list. Problem list updated.  Objective:   Vitals:   12/01/15 1430  BP: 106/72  Pulse: 72  Temp: 97.6 F (36.4 C)  Weight: 183 lb 3.2 oz (83.1 kg)    Fetal Status: Fetal Heart Rate (bpm): 152   Movement: Present     General:  Alert, oriented and cooperative. Patient is in no acute distress.  Skin: Skin is warm and dry. No rash noted.   Cardiovascular: Normal heart rate noted  Respiratory: Normal respiratory effort, no problems with respiration noted  Abdomen: Soft, gravid, appropriate for gestational age. Pain/Pressure: Absent     Pelvic:  Cervical exam deferred        Extremities: Normal range of motion.  Edema: None  Mental Status: Normal mood and affect. Normal behavior. Normal judgment and thought content.   Urinalysis:      Assessment and Plan:  Pregnancy: G5P1031 at 4273w5d  1. Supervision of normal first pregnancy, antepartum Flu vaccine today U/S completed today  2. Rubella non-immune status, antepartum Vaccine postpartum  Preterm labor symptoms and general obstetric precautions including but not limited to vaginal bleeding, contractions, leaking of fluid and fetal movement were reviewed in detail with the patient. Please refer to After Visit Summary for other counseling recommendations.  Return in about 4 weeks (around  12/29/2015) for OB visit.   Hermina StaggersMichael L Hollie Wojahn, MD

## 2015-12-28 ENCOUNTER — Encounter: Payer: Medicaid Other | Admitting: Obstetrics and Gynecology

## 2016-01-02 ENCOUNTER — Other Ambulatory Visit (HOSPITAL_COMMUNITY)
Admission: RE | Admit: 2016-01-02 | Discharge: 2016-01-02 | Disposition: A | Discharge status: Home / Self Care | Payer: Medicaid Other | Source: Ambulatory Visit | Attending: Certified Nurse Midwife | Admitting: Certified Nurse Midwife

## 2016-01-02 ENCOUNTER — Ambulatory Visit (INDEPENDENT_AMBULATORY_CARE_PROVIDER_SITE_OTHER): Payer: Medicaid Other | Admitting: Certified Nurse Midwife

## 2016-01-02 VITALS — BP 118/75 | HR 101 | Wt 179.0 lb

## 2016-01-02 DIAGNOSIS — A6000 Herpesviral infection of urogenital system, unspecified: Secondary | ICD-10-CM | POA: Insufficient documentation

## 2016-01-02 DIAGNOSIS — O98319 Other infections with a predominantly sexual mode of transmission complicating pregnancy, unspecified trimester: Secondary | ICD-10-CM

## 2016-01-02 DIAGNOSIS — O98312 Other infections with a predominantly sexual mode of transmission complicating pregnancy, second trimester: Secondary | ICD-10-CM

## 2016-01-02 DIAGNOSIS — R87810 Cervical high risk human papillomavirus (HPV) DNA test positive: Secondary | ICD-10-CM | POA: Insufficient documentation

## 2016-01-02 DIAGNOSIS — O9989 Other specified diseases and conditions complicating pregnancy, childbirth and the puerperium: Secondary | ICD-10-CM

## 2016-01-02 DIAGNOSIS — Z2839 Other underimmunization status: Secondary | ICD-10-CM

## 2016-01-02 DIAGNOSIS — Z3400 Encounter for supervision of normal first pregnancy, unspecified trimester: Secondary | ICD-10-CM

## 2016-01-02 DIAGNOSIS — A601 Herpesviral infection of perianal skin and rectum: Secondary | ICD-10-CM

## 2016-01-02 DIAGNOSIS — Z1151 Encounter for screening for human papillomavirus (HPV): Secondary | ICD-10-CM | POA: Insufficient documentation

## 2016-01-02 DIAGNOSIS — A630 Anogenital (venereal) warts: Secondary | ICD-10-CM

## 2016-01-02 DIAGNOSIS — Z3482 Encounter for supervision of other normal pregnancy, second trimester: Secondary | ICD-10-CM

## 2016-01-02 DIAGNOSIS — Z283 Underimmunization status: Secondary | ICD-10-CM

## 2016-01-02 HISTORY — DX: Herpesviral infection of urogenital system, unspecified: A60.00

## 2016-01-02 MED ORDER — IMIQUIMOD 5 % EX CREA: TOPICAL_CREAM | CUTANEOUS | 0 refills | Status: DC

## 2016-01-02 NOTE — Addendum Note (Signed)
Addended by: Marya LandryFOSTER, Blayn Whetsell D on: 01/02/2016 04:33 PM   Modules accepted: Orders

## 2016-01-02 NOTE — Progress Notes (Signed)
Subjective:    Joy Rivera is a 25 y.o. female being seen today for her obstetrical visit. She is at 3985w2d gestation. Patient reports: no bleeding, no contractions, no cramping, no leaking and vaginal irritation . Fetal movement: normal.  History of herpes  Problem List Items Addressed This Visit      Musculoskeletal and Integument   Genital warts complicating pregnancy, second trimester   Relevant Medications   imiquimod (ALDARA) 5 % cream   Other Relevant Orders   NuSwab Vaginitis Plus (VG+)   Cervicovaginal ancillary only   Herpes simplex virus culture     Genitourinary   Genital herpes   Relevant Medications   imiquimod (ALDARA) 5 % cream     Other   Supervision of normal first pregnancy, antepartum   Rubella non-immune status, antepartum    Other Visit Diagnoses    Encounter for supervision of other normal pregnancy in second trimester    -  Primary   Relevant Orders   MaterniT21 PLUS Core+SCA     Patient Active Problem List   Diagnosis Date Noted  . Genital herpes 01/02/2016  . Genital warts complicating pregnancy, second trimester 01/02/2016  . Rubella non-immune status, antepartum 10/31/2015  . 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  Size: 4 mm.  Swabs obtained.   Assessment:    Pregnancy @ 6985w2d    Genital warts  Plan:   Viral cultures done   OBGCT: discussed and ordered for next visit. Signs and symptoms of preterm labor: discussed and handout given.  Labs, problem list reviewed and updated 2 hr GTT planned Follow up in 4 weeks.     Genial warts plan discussed with Dr. Ladean Rayaonstance.  Aldara ordered.

## 2016-01-05 ENCOUNTER — Other Ambulatory Visit: Payer: Self-pay | Admitting: Certified Nurse Midwife

## 2016-01-05 DIAGNOSIS — B3731 Acute candidiasis of vulva and vagina: Secondary | ICD-10-CM

## 2016-01-05 DIAGNOSIS — B373 Candidiasis of vulva and vagina: Secondary | ICD-10-CM

## 2016-01-05 LAB — NUSWAB VAGINITIS PLUS (VG+)
Candida albicans, NAA: POSITIVE — AB
Candida glabrata, NAA: NEGATIVE
Chlamydia trachomatis, NAA: NEGATIVE
Neisseria gonorrhoeae, NAA: NEGATIVE
TRICH VAG BY NAA: NEGATIVE

## 2016-01-05 LAB — HERPES SIMPLEX VIRUS CULTURE

## 2016-01-05 MED ORDER — FLUCONAZOLE 150 MG PO TABS: 150.0000 mg | ORAL_TABLET | Freq: Once | ORAL | 0 refills | Status: AC

## 2016-01-09 LAB — CERVICOVAGINAL ANCILLARY ONLY
HPV (WINDOPATH): DETECTED — AB
HPV 16/18/45 GENOTYPING: POSITIVE — AB

## 2016-01-10 ENCOUNTER — Other Ambulatory Visit: Payer: Self-pay | Admitting: Certified Nurse Midwife

## 2016-01-10 DIAGNOSIS — R87810 Cervical high risk human papillomavirus (HPV) DNA test positive: Secondary | ICD-10-CM

## 2016-01-10 DIAGNOSIS — Z3400 Encounter for supervision of normal first pregnancy, unspecified trimester: Secondary | ICD-10-CM

## 2016-01-10 LAB — MATERNIT21 PLUS CORE+SCA
CHROMOSOME 13: NEGATIVE
CHROMOSOME 18: NEGATIVE
CHROMOSOME 21: NEGATIVE
Y Chromosome: DETECTED

## 2016-01-31 ENCOUNTER — Ambulatory Visit (INDEPENDENT_AMBULATORY_CARE_PROVIDER_SITE_OTHER): Payer: Medicaid Other | Admitting: Certified Nurse Midwife

## 2016-01-31 ENCOUNTER — Other Ambulatory Visit: Payer: Medicaid Other

## 2016-01-31 VITALS — BP 105/74 | HR 88 | Wt 184.0 lb

## 2016-01-31 DIAGNOSIS — Z3483 Encounter for supervision of other normal pregnancy, third trimester: Secondary | ICD-10-CM

## 2016-01-31 DIAGNOSIS — Z3480 Encounter for supervision of other normal pregnancy, unspecified trimester: Secondary | ICD-10-CM

## 2016-01-31 NOTE — Patient Instructions (Addendum)
Third Trimester of Pregnancy The third trimester is from week 29 through week 40 (months 7 through 9). The third trimester is a time when the unborn baby (fetus) is growing rapidly. At the end of the ninth month, the fetus is about 20 inches in length and weighs 6-10 pounds. Body changes during your third trimester Your body goes through many changes during pregnancy. The changes vary from woman to woman. During the third trimester:  Your weight will continue to increase. You can expect to gain 25-35 pounds (11-16 kg) by the end of the pregnancy.  You may begin to get stretch marks on your hips, abdomen, and breasts.  You may urinate more often because the fetus is moving lower into your pelvis and pressing on your bladder.  You may develop or continue to have heartburn. This is caused by increased hormones that slow down muscles in the digestive tract.  You may develop or continue to have constipation because increased hormones slow digestion and cause the muscles that push waste through your intestines to relax.  You may develop hemorrhoids. These are swollen veins (varicose veins) in the rectum that can itch or be painful.  You may develop swollen, bulging veins (varicose veins) in your legs.  You may have increased body aches in the pelvis, back, or thighs. This is due to weight gain and increased hormones that are relaxing your joints.  You may have changes in your hair. These can include thickening of your hair, rapid growth, and changes in texture. Some women also have hair loss during or after pregnancy, or hair that feels dry or thin. Your hair will most likely return to normal after your baby is born.  Your breasts will continue to grow and they will continue to become tender. A yellow fluid (colostrum) may leak from your breasts. This is the first milk you are producing for your baby.  Your belly button may stick out.  You may notice more swelling in your hands, face, or  ankles.  You may have increased tingling or numbness in your hands, arms, and legs. The skin on your belly may also feel numb.  You may feel short of breath because of your expanding uterus.  You may have more problems sleeping. This can be caused by the size of your belly, increased need to urinate, and an increase in your body's metabolism.  You may notice the fetus "dropping," or moving lower in your abdomen.  You may have increased vaginal discharge.  Your cervix becomes thin and soft (effaced) near your due date. What to expect at prenatal visits You will have prenatal exams every 2 weeks until week 36. Then you will have weekly prenatal exams. During a routine prenatal visit:  You will be weighed to make sure you and the fetus are growing normally.  Your blood pressure will be taken.  Your abdomen will be measured to track your baby's growth.  The fetal heartbeat will be listened to.  Any test results from the previous visit will be discussed.  You may have a cervical check near your due date to see if you have effaced. At around 36 weeks, your health care provider will check your cervix. At the same time, your health care provider will also perform a test on the secretions of the vaginal tissue. This test is to determine if a type of bacteria, Group B streptococcus, is present. Your health care provider will explain this further. Your health care provider may ask you:    What your birth plan is.  How you are feeling.  If you are feeling the baby move.  If you have had any abnormal symptoms, such as leaking fluid, bleeding, severe headaches, or abdominal cramping.  If you are using any tobacco products, including cigarettes, chewing tobacco, and electronic cigarettes.  If you have any questions. Other tests or screenings that may be performed during your third trimester include:  Blood tests that check for low iron levels (anemia).  Fetal testing to check the health,  activity level, and growth of the fetus. Testing is done if you have certain medical conditions or if there are problems during the pregnancy.  Nonstress test (NST). This test checks the health of your baby to make sure there are no signs of problems, such as the baby not getting enough oxygen. During this test, a belt is placed around your belly. The baby is made to move, and its heart rate is monitored during movement. What is false labor? False labor is a condition in which you feel small, irregular tightenings of the muscles in the womb (contractions) that eventually go away. These are called Braxton Hicks contractions. Contractions may last for hours, days, or even weeks before true labor sets in. If contractions come at regular intervals, become more frequent, increase in intensity, or become painful, you should see your health care provider. What are the signs of labor?  Abdominal cramps.  Regular contractions that start at 10 minutes apart and become stronger and more frequent with time.  Contractions that start on the top of the uterus and spread down to the lower abdomen and back.  Increased pelvic pressure and dull back pain.  A watery or bloody mucus discharge that comes from the vagina.  Leaking of amniotic fluid. This is also known as your "water breaking." It could be a slow trickle or a gush. Let your doctor know if it has a color or strange odor. If you have any of these signs, call your health care provider right away, even if it is before your due date. Follow these instructions at home: Eating and drinking  Continue to eat regular, healthy meals.  Do not eat:  Raw meat or meat spreads.  Unpasteurized milk or cheese.  Unpasteurized juice.  Store-made salad.  Refrigerated smoked seafood.  Hot dogs or deli meat, unless they are piping hot.  More than 6 ounces of albacore tuna a week.  Shark, swordfish, king mackerel, or tile fish.  Store-made salads.  Raw  sprouts, such as mung bean or alfalfa sprouts.  Take prenatal vitamins as told by your health care provider.  Take 1000 mg of calcium daily as told by your health care provider.  If you develop constipation:  Take over-the-counter or prescription medicines.  Drink enough fluid to keep your urine clear or pale yellow.  Eat foods that are high in fiber, such as fresh fruits and vegetables, whole grains, and beans.  Limit foods that are high in fat and processed sugars, such as fried and sweet foods. Activity  Exercise only as directed by your health care provider. Healthy pregnant women should aim for 2 hours and 30 minutes of moderate exercise per week. If you experience any pain or discomfort while exercising, stop.  Avoid heavy lifting.  Do not exercise in extreme heat or humidity, or at high altitudes.  Wear low-heel, comfortable shoes.  Practice good posture.  Do not travel far distances unless it is absolutely necessary and only with the approval   of your health care provider.  Wear your seat belt at all times while in a car, on a bus, or on a plane.  Take frequent breaks and rest with your legs elevated if you have leg cramps or low back pain.  Do not use hot tubs, steam rooms, or saunas.  You may continue to have sex unless your health care provider tells you otherwise. Lifestyle  Do not use any products that contain nicotine or tobacco, such as cigarettes and e-cigarettes. If you need help quitting, ask your health care provider.  Do not drink alcohol.  Do not use any medicinal herbs or unprescribed drugs. These chemicals affect the formation and growth of the baby.  If you develop varicose veins:  Wear support pantyhose or compression stockings as told by your healthcare provider.  Elevate your feet for 15 minutes, 3-4 times a day.  Wear a supportive maternity bra to help with breast tenderness. General instructions  Take over-the-counter and prescription  medicines only as told by your health care provider. There are medicines that are either safe or unsafe to take during pregnancy.  Take warm sitz baths to soothe any pain or discomfort caused by hemorrhoids. Use hemorrhoid cream or witch hazel if your health care provider approves.  Avoid cat litter boxes and soil used by cats. These carry germs that can cause birth defects in the baby. If you have a cat, ask someone to clean the litter box for you.  To prepare for the arrival of your baby:  Take prenatal classes to understand, practice, and ask questions about the labor and delivery.  Make a trial run to the hospital.  Visit the hospital and tour the maternity area.  Arrange for maternity or paternity leave through employers.  Arrange for family and friends to take care of pets while you are in the hospital.  Purchase a rear-facing car seat and make sure you know how to install it in your car.  Pack your hospital bag.  Prepare the baby's nursery. Make sure to remove all pillows and stuffed animals from the baby's crib to prevent suffocation.  Visit your dentist if you have not gone during your pregnancy. Use a soft toothbrush to brush your teeth and be gentle when you floss.  Keep all prenatal follow-up visits as told by your health care provider. This is important. Contact a health care provider if:  You are unsure if you are in labor or if your water has broken.  You become dizzy.  You have mild pelvic cramps, pelvic pressure, or nagging pain in your abdominal area.  You have lower back pain.  You have persistent nausea, vomiting, or diarrhea.  You have an unusual or bad smelling vaginal discharge.  You have pain when you urinate. Get help right away if:  You have a fever.  You are leaking fluid from your vagina.  You have spotting or bleeding from your vagina.  You have severe abdominal pain or cramping.  You have rapid weight loss or weight gain.  You have  shortness of breath with chest pain.  You notice sudden or extreme swelling of your face, hands, ankles, feet, or legs.  Your baby makes fewer than 10 movements in 2 hours.  You have severe headaches that do not go away with medicine.  You have vision changes. Summary  The third trimester is from week 29 through week 40, months 7 through 9. The third trimester is a time when the unborn baby (fetus)   is growing rapidly.  During the third trimester, your discomfort may increase as you and your baby continue to gain weight. You may have abdominal, leg, and back pain, sleeping problems, and an increased need to urinate.  During the third trimester your breasts will keep growing and they will continue to become tender. A yellow fluid (colostrum) may leak from your breasts. This is the first milk you are producing for your baby.  False labor is a condition in which you feel small, irregular tightenings of the muscles in the womb (contractions) that eventually go away. These are called Braxton Hicks contractions. Contractions may last for hours, days, or even weeks before true labor sets in.  Signs of labor can include: abdominal cramps; regular contractions that start at 10 minutes apart and become stronger and more frequent with time; watery or bloody mucus discharge that comes from the vagina; increased pelvic pressure and dull back pain; and leaking of amniotic fluid. This information is not intended to replace advice given to you by your health care provider. Make sure you discuss any questions you have with your health care provider. Document Released: 01/16/2001 Document Revised: 06/30/2015 Document Reviewed: 03/25/2012 Elsevier Interactive Patient Education  2017 [employer] Inc.  

## 2016-01-31 NOTE — Progress Notes (Signed)
Subjective:    Joy Rivera is a 25 y.o. female being seen today for her obstetrical visit. She is at 6755w3d gestation. Patient reports no complaints. Fetal movement: normal.  Problem List Items Addressed This Visit      Other   Supervision of other normal pregnancy, antepartum    Other Visit Diagnoses    Encounter for supervision of other normal pregnancy in third trimester    -  Primary   Relevant Orders   Glucose Tolerance, 2 Hours w/1 Hour   CBC   Panel [id number]   RPR     Patient Active Problem List   Diagnosis Date Noted  . Cervical high risk HPV (human papillomavirus) test positive 01/10/2016  . Genital herpes 01/02/2016  . Genital warts complicating pregnancy, second trimester 01/02/2016  . Rubella non-immune status, antepartum 10/31/2015  . Supervision of other normal pregnancy, antepartum 10/27/2015   Objective:    BP 105/74   Pulse 88   Wt 184 lb (83.5 kg)   LMP 07/16/2015   BMI 32.59 kg/m  FHT:  136 BPM  Uterine Size: 27 cm and size equals dates  Presentation: cephalic     Assessment:    Pregnancy @ 8455w3d weeks   Plan:     labs reviewed, problem list updated  GBS planning TDAP offered  Rhogam given for RH negative Pediatrician: discussed, [facility] planned. Infant feeding: plans to breastfeed. Maternity leave: discussed. Cigarette smoking: never smoked. Orders Placed This Encounter  Procedures  . Glucose Tolerance, 2 Hours w/1 Hour  . CBC  . Panel [id number]  . RPR   No orders of the defined types were placed in this encounter.  Follow up in 2 Weeks.

## 2016-02-03 ENCOUNTER — Other Ambulatory Visit: Payer: Self-pay | Admitting: Certified Nurse Midwife

## 2016-02-03 DIAGNOSIS — Z3480 Encounter for supervision of other normal pregnancy, unspecified trimester: Secondary | ICD-10-CM

## 2016-02-03 LAB — GLUCOSE TOLERANCE, 2 HOURS W/ 1HR
GLUCOSE, 1 HOUR: 131 mg/dL (ref 65–179)
GLUCOSE, 2 HOUR: 86 mg/dL (ref 65–152)
Glucose, Fasting: 77 mg/dL (ref 65–91)

## 2016-02-03 LAB — HIV-1/2 AB - DIFFERENTIATION
HIV 1 AB: NEGATIVE
HIV 2 AB: NEGATIVE

## 2016-02-03 LAB — RNA QUALITATIVE: HIV 1 RNA Qualitative: NEGATIVE

## 2016-02-03 LAB — CBC
Hematocrit: 35.3 % (ref 34.0–46.6)
Hemoglobin: 11.8 g/dL (ref 11.1–15.9)
MCH: 28.7 pg (ref 26.6–33.0)
MCHC: 33.4 g/dL (ref 31.5–35.7)
MCV: 86 fL (ref 79–97)
Platelets: 236 10*3/uL (ref 150–379)
RBC: 4.11 x10E6/uL (ref 3.77–5.28)
RDW: 13.2 % (ref 12.3–15.4)
WBC: 6 10*3/uL (ref 3.4–10.8)

## 2016-02-03 LAB — RPR: RPR Ser Ql: NONREACTIVE

## 2016-02-06 NOTE — L&D Delivery Note (Signed)
Delivery Note At 5:38 AM a viable female was delivered via Vaginal, Spontaneous (Presentation: Vertex ).  APGAR: 7, 9; weight  .   Placenta status: , .  Cord:  with the following complications:  Cord pH: NA  Anesthesia: Epidural  Episiotomy: None Lacerations: None Suture Repair: NA Est. Blood Loss (mL): 350  Mom to postpartum.  Baby to Couplet care / Skin to Skin.  Hermina StaggersMichael L Lyzette Reinhardt 01/27/2017, 7:47 AM

## 2016-02-06 NOTE — L&D Delivery Note (Signed)
Delivery Note At 3:41 PM a viable female was delivered via Vaginal, Spontaneous Delivery (Presentation:vertex ; OP ).  APGAR: assigned by NICU weight  .   Placenta status:spont , shultz.  Cord:3vc  with the following complications:abruption .  Cord pH: n/a  Anesthesia:epidural   Episiotomy: None Lacerations: None Suture Repair: n/a Est. Blood Loss (mL): 100  Mom to postpartum.  Baby to NICU.  Joy Rivera 03/04/2016, 3:49 PM

## 2016-02-07 ENCOUNTER — Telehealth: Payer: Self-pay | Admitting: *Deleted

## 2016-02-07 ENCOUNTER — Inpatient Hospital Stay (HOSPITAL_COMMUNITY)
Admission: AD | Admit: 2016-02-07 | Discharge: 2016-02-07 | Disposition: A | Discharge status: Home / Self Care | Payer: Medicaid Other | Source: Ambulatory Visit | Attending: Obstetrics & Gynecology | Admitting: Obstetrics & Gynecology

## 2016-02-07 ENCOUNTER — Encounter (HOSPITAL_COMMUNITY): Payer: Self-pay

## 2016-02-07 DIAGNOSIS — R12 Heartburn: Secondary | ICD-10-CM | POA: Diagnosis not present

## 2016-02-07 DIAGNOSIS — Z3A29 29 weeks gestation of pregnancy: Secondary | ICD-10-CM | POA: Diagnosis not present

## 2016-02-07 DIAGNOSIS — O99613 Diseases of the digestive system complicating pregnancy, third trimester: Secondary | ICD-10-CM | POA: Insufficient documentation

## 2016-02-07 DIAGNOSIS — A09 Infectious gastroenteritis and colitis, unspecified: Secondary | ICD-10-CM | POA: Insufficient documentation

## 2016-02-07 DIAGNOSIS — O26893 Other specified pregnancy related conditions, third trimester: Secondary | ICD-10-CM

## 2016-02-07 DIAGNOSIS — O210 Mild hyperemesis gravidarum: Secondary | ICD-10-CM | POA: Diagnosis present

## 2016-02-07 DIAGNOSIS — K529 Noninfective gastroenteritis and colitis, unspecified: Secondary | ICD-10-CM

## 2016-02-07 LAB — COMPREHENSIVE METABOLIC PANEL WITH GFR
ALT: 11 U/L — ABNORMAL LOW (ref 14–54)
AST: 15 U/L (ref 15–41)
Albumin: 2.4 g/dL — ABNORMAL LOW (ref 3.5–5.0)
Alkaline Phosphatase: 52 U/L (ref 38–126)
Anion gap: 6 (ref 5–15)
BUN: 5 mg/dL — ABNORMAL LOW (ref 6–20)
CO2: 24 mmol/L (ref 22–32)
Calcium: 7.9 mg/dL — ABNORMAL LOW (ref 8.9–10.3)
Chloride: 103 mmol/L (ref 101–111)
Creatinine, Ser: 0.56 mg/dL (ref 0.44–1.00)
GFR calc Af Amer: >60 mL/min
GFR calc non Af Amer: >60 mL/min
Glucose, Bld: 241 mg/dL — ABNORMAL HIGH (ref 65–99)
Potassium: 3.1 mmol/L — ABNORMAL LOW (ref 3.5–5.1)
Sodium: 133 mmol/L — ABNORMAL LOW (ref 135–145)
Total Bilirubin: <0.1 mg/dL — ABNORMAL LOW (ref 0.3–1.2)
Total Protein: 5.6 g/dL — ABNORMAL LOW (ref 6.5–8.1)

## 2016-02-07 LAB — CBC
HCT: 31.1 % — ABNORMAL LOW (ref 36.0–46.0)
Hemoglobin: 10.8 g/dL — ABNORMAL LOW (ref 12.0–15.0)
MCH: 29 pg (ref 26.0–34.0)
MCHC: 34.7 g/dL (ref 30.0–36.0)
MCV: 83.4 fL (ref 78.0–100.0)
PLATELETS: 199 10*3/uL (ref 150–400)
RBC: 3.73 MIL/uL — AB (ref 3.87–5.11)
RDW: 12.7 % (ref 11.5–15.5)
WBC: 6.7 10*3/uL (ref 4.0–10.5)

## 2016-02-07 LAB — URINALYSIS, ROUTINE W REFLEX MICROSCOPIC
BILIRUBIN URINE: NEGATIVE
Bacteria, UA: NONE SEEN
GLUCOSE, UA: NEGATIVE mg/dL
Hgb urine dipstick: NEGATIVE
KETONES UR: 20 mg/dL — AB
LEUKOCYTES UA: NEGATIVE
Nitrite: NEGATIVE
PH: 6 (ref 5.0–8.0)
Protein, ur: 30 mg/dL — AB
Specific Gravity, Urine: 1.019 (ref 1.005–1.030)

## 2016-02-07 MED ORDER — ACETAMINOPHEN 325 MG PO TABS
650.0000 mg | ORAL_TABLET | Freq: Once | ORAL | Status: AC
  Administered 2016-02-07: 650 mg via ORAL
  Filled 2016-02-07: qty 2

## 2016-02-07 MED ORDER — ONDANSETRON HCL 4 MG/2ML IJ SOLN
4.0000 mg | Freq: Once | INTRAMUSCULAR | Status: AC
  Administered 2016-02-07: 4 mg via INTRAVENOUS
  Filled 2016-02-07: qty 2

## 2016-02-07 MED ORDER — ONDANSETRON 4 MG PO TBDP: 4.0000 mg | ORAL_TABLET | Freq: Three times a day (TID) | ORAL | 0 refills | Status: DC | PRN

## 2016-02-07 MED ORDER — FAMOTIDINE IN NACL 20-0.9 MG/50ML-% IV SOLN
20.0000 mg | Freq: Once | INTRAVENOUS | Status: AC
  Administered 2016-02-07: 20 mg via INTRAVENOUS
  Filled 2016-02-07: qty 50

## 2016-02-07 MED ORDER — RANITIDINE HCL 150 MG PO TABS: 150.0000 mg | ORAL_TABLET | Freq: Two times a day (BID) | ORAL | 0 refills | Status: DC

## 2016-02-07 MED ORDER — DEXTROSE 5 % IN LACTATED RINGERS IV BOLUS
1000.0000 mL | Freq: Once | INTRAVENOUS | Status: AC
  Administered 2016-02-07: 1000 mL via INTRAVENOUS

## 2016-02-07 NOTE — Telephone Encounter (Signed)
Pt called to office, left no message.  Return call to pt.  Pt states that she has been suffering from "sulphur Burps" for several weeks.  Pt states she is now having severe diarrhea. Pt states that she ate only 2 times yesterday and could not tolerate.  Pt states that she tried to drink water today and it went straight through. Pt states that she has not ate today.  Pt was advised to attempt fluids.  Pt states that she doesn't know if she is contracting, bloated or just having pain.  Pt states that she is not currently taking Protonix or Phenergan on her med list-she never got Rxs. Reviewed with RDenney.  Pt was advised that she may take Immodium OTC.  Pt was made aware she needs to be seen in [facility] for possible fluids, eval for ctx. Pt states understanding

## 2016-02-07 NOTE — Discharge Instructions (Signed)
Bland Diet Introduction A bland diet consists of foods that do not have a lot of fat or fiber. Foods without fat or fiber are easier for the body to digest. They are also less likely to irritate your mouth, throat, stomach, and other parts of your gastrointestinal tract. A bland diet is sometimes called a BRAT diet. What is my plan? Your health care provider or dietitian may recommend specific changes to your diet to prevent and treat your symptoms, such as:  Eating small meals often.  Cooking food until it is soft enough to chew easily.  Chewing your food well.  Drinking fluids slowly.  Not eating foods that are very spicy, sour, or fatty.  Not eating citrus fruits, such as oranges and grapefruit. What do I need to know about this diet?  Eat a variety of foods from the bland diet food list.  Do not follow a bland diet longer than you have to.  Ask your health care provider whether you should take vitamins. What foods can I eat? Grains  Hot cereals, such as cream of wheat. Bread, crackers, or tortillas made from refined white flour. Rice. Vegetables  Canned or cooked vegetables. Mashed or boiled potatoes. Fruits  Bananas. Applesauce. Other types of cooked or canned fruit with the skin and seeds removed, such as canned peaches or pears. Meats and Other Protein Sources  Scrambled eggs. Creamy peanut butter or other nut butters. Lean, well-cooked meats, such as chicken or fish. Tofu. Soups or broths. Dairy  Low-fat dairy products, such as milk, cottage cheese, or yogurt. Beverages  Water. Herbal tea. Apple juice. Sweets and Desserts  Pudding. Custard. Fruit gelatin. Ice cream. Fats and Oils  Mild salad dressings. Canola or olive oil. The items listed above may not be a complete list of allowed foods or beverages. Contact your dietitian for more options.  What foods are not recommended? Foods and ingredients that are often not recommended include:  Spicy foods, such as hot  sauce or salsa.  Fried foods.  Sour foods, such as pickled or fermented foods.  Raw vegetables or fruits, especially citrus or berries.  Caffeinated drinks.  Alcohol.  Strongly flavored seasonings or condiments. The items listed above may not be a complete list of foods and beverages that are not allowed. Contact your dietitian for more information.  This information is not intended to replace advice given to you by your health care provider. Make sure you discuss any questions you have with your health care provider. Document Released: 05/16/2015 Document Revised: 06/30/2015 Document Reviewed: 02/03/2014  2017 Elsevier       Gastroesophageal Reflux Disease, Adult Introduction Normally, food travels down the esophagus and stays in the stomach to be digested. If a person has gastroesophageal reflux disease (GERD), food and stomach acid move back up into the esophagus. When this happens, the esophagus becomes sore and swollen (inflamed). Over time, GERD can make small holes (ulcers) in the lining of the esophagus. Follow these instructions at home: Diet  Follow a diet as told by your doctor. You may need to avoid foods and drinks such as:  Coffee and tea (with or without caffeine).  Drinks that contain alcohol.  Energy drinks and sports drinks.  Carbonated drinks or sodas.  Chocolate and cocoa.  Peppermint and mint flavorings.  Garlic and onions.  Horseradish.  Spicy and acidic foods, such as peppers, chili powder, curry powder, vinegar, hot sauces, and BBQ sauce.  Citrus fruit juices and citrus fruits, such as oranges,  lemons, and limes.  Tomato-based foods, such as red sauce, chili, salsa, and pizza with red sauce.  Fried and fatty foods, such as donuts, french fries, potato chips, and high-fat dressings.  High-fat meats, such as hot dogs, rib eye steak, sausage, ham, and bacon.  High-fat dairy items, such as whole milk, butter, and cream cheese.  Eat small  meals often. Avoid eating large meals.  Avoid drinking large amounts of liquid with your meals.  Avoid eating meals during the 2-3 hours before bedtime.  Avoid lying down right after you eat.  Do not exercise right after you eat. General instructions  Pay attention to any changes in your symptoms.  Take over-the-counter and prescription medicines only as told by your doctor. Do not take aspirin, ibuprofen, or other NSAIDs unless your doctor says it is okay.  Do not use any tobacco products, including cigarettes, chewing tobacco, and e-cigarettes. If you need help quitting, ask your doctor.  Wear loose clothes. Do not wear anything tight around your waist.  Raise (elevate) the head of your bed about 6 inches (15 cm).  Try to lower your stress. If you need help doing this, ask your doctor.  If you are overweight, lose an amount of weight that is healthy for you. Ask your doctor about a safe weight loss goal.  Keep all follow-up visits as told by your doctor. This is important. Contact a doctor if:  You have new symptoms.  You lose weight and you do not know why it is happening.  You have trouble swallowing, or it hurts to swallow.  You have wheezing or a cough that keeps happening.  Your symptoms do not get better with treatment.  You have a hoarse voice. Get help right away if:  You have pain in your arms, neck, jaw, teeth, or back.  You feel sweaty, dizzy, or light-headed.  You have chest pain or shortness of breath.  You throw up (vomit) and your throw up looks like blood or coffee grounds.  You pass out (faint).  Your poop (stool) is bloody or black.  You cannot swallow, drink, or eat. This information is not intended to replace advice given to you by your health care provider. Make sure you discuss any questions you have with your health care provider. Document Released: 07/11/2007 Document Revised: 06/30/2015 Document Reviewed: 05/19/2014  2017  Elsevier   Food Choices for Gastroesophageal Reflux Disease, Adult When you have gastroesophageal reflux disease (GERD), the foods you eat and your eating habits are very important. Choosing the right foods can help ease your discomfort. What guidelines do I need to follow?  Choose fruits, vegetables, whole grains, and low-fat dairy products.  Choose low-fat meat, fish, and poultry.  Limit fats such as oils, salad dressings, butter, nuts, and avocado.  Keep a food diary. This helps you identify foods that cause symptoms.  Avoid foods that cause symptoms. These may be different for everyone.  Eat small meals often instead of 3 large meals a day.  Eat your meals slowly, in a place where you are relaxed.  Limit fried foods.  Cook foods using methods other than frying.  Avoid drinking alcohol.  Avoid drinking large amounts of liquids with your meals.  Avoid bending over or lying down until 2-3 hours after eating. What foods are not recommended? These are some foods and drinks that may make your symptoms worse: Vegetables  Tomatoes. Tomato juice. Tomato and spaghetti sauce. Chili peppers. Onion and garlic. Horseradish. Fruits  Oranges, grapefruit, and lemon (fruit and juice). Meats  High-fat meats, fish, and poultry. This includes hot dogs, ribs, ham, sausage, salami, and bacon. Dairy  Whole milk and chocolate milk. Sour cream. Cream. Butter. Ice cream. Cream cheese. Drinks  Coffee and tea. Bubbly (carbonated) drinks or energy drinks. Condiments  Hot sauce. Barbecue sauce. Sweets/Desserts  Chocolate and cocoa. Donuts. Peppermint and spearmint. Fats and Oils  High-fat foods. This includes [place] fries and potato chips. Other  Vinegar. Strong spices. This includes black pepper, white pepper, red pepper, cayenne, curry powder, cloves, ginger, and chili powder. The items listed above may not be a complete list of foods and drinks to avoid. Contact your dietitian for more  information.  This information is not intended to replace advice given to you by your health care provider. Make sure you discuss any questions you have with your health care provider. Document Released: 07/24/2011 Document Revised: 06/30/2015 Document Reviewed: 11/26/2012 Elsevier Interactive Patient Education  2017 [employer].

## 2016-02-07 NOTE — MAU Provider Note (Signed)
History     CSN: [id number]  Arrival date and time: 02/07/16 [id number]   First Provider Initiated Contact with Patient 02/07/16 1938      Chief Complaint  Patient presents with  . Abdominal Cramping  . Diarrhea  . burping   HPI Rutherford Nailiffany A Casten is a 26 y.o. [id number] at 5153w3d who presents complaining of vomiting, diarrhea, & belching. Reports similar symptoms earlier this month that resolved without treatment. Current symptoms began this morning. Has had 4 episodes of brown watery stools today. Also reports nausea & belching up what tastes like sulphur. Has not treated symptoms. Hasn't eaten since yesterday. Drank water this morning but states it "went right through her". Some abdominal cramping. Denies fever/chills, sick contacts, recent hospitalization or abx therapy, or vaginal bleeding. Positive fetal movement.   OB History    Gravida Para Term Preterm AB Living   5 1 1  0 3 1   SAB TAB Ectopic Multiple Live Births   0 2 1 0 1      Past Medical History:  Diagnosis Date  . Abnormal Pap smear   . Anxiety   . Depression   . Medical history non-contributory     Past Surgical History:  Procedure Laterality Date  . NO PAST SURGERIES      Family History  Problem Relation Age of Onset  . Hypertension Father   . Heart disease Father   . Cancer Maternal Aunt   . Cancer Maternal Uncle   . Hypertension Paternal Grandmother   . Cancer Maternal Grandmother   . Cancer Maternal Grandfather     Social History  Substance Use Topics  . Smoking status: Never Smoker  . Smokeless tobacco: Never Used  . Alcohol use No    Allergies: No Known Allergies  Prescriptions Prior to Admission  Medication Sig Dispense Refill Last Dose  . docusate sodium (COLACE) 100 MG capsule Take 1 capsule (100 mg total) by mouth every 12 (twelve) hours. (Patient not taking: Reported on 01/02/2016) 60 capsule 0 Not Taking  . escitalopram (LEXAPRO) 10 MG tablet Take 10 mg by mouth daily.   Not Taking  .  HYDROcodone-acetaminophen (NORCO/VICODIN) 5-325 MG tablet Take 1 tablet by mouth every 4 (four) hours as needed. (Patient not taking: Reported on 01/02/2016) 15 tablet 0 Not Taking  . imiquimod (ALDARA) 5 % cream Apply topically 3 (three) times a week. At bedtime 12 each 0   . pantoprazole (PROTONIX) 20 MG tablet Take 1 tablet (20 mg total) by mouth daily. (Patient not taking: Reported on 01/02/2016) 30 tablet 1 Not Taking  . Prenatal Multivit-Min-Fe-FA (PRENATAL VITAMINS) 0.8 MG tablet Take 1 tablet by mouth daily. 30 tablet 12 Taking  . promethazine (PHENERGAN) 25 MG tablet Take 1 tablet (25 mg total) by mouth every 6 (six) hours as needed for nausea or vomiting. (Patient not taking: Reported on 01/02/2016) 30 tablet 0 Not Taking    Review of Systems  Constitutional: Negative.   Gastrointestinal: Positive for abdominal pain, diarrhea, heartburn, nausea and vomiting. Negative for blood in stool and melena.  Genitourinary: Negative.    Physical Exam   Blood pressure 124/71, pulse 88, temperature 98.6 F (37 C), temperature source Axillary, resp. rate 18, weight 183 lb 9.6 oz (83.3 kg), last menstrual period 07/16/2015, SpO2 100 %, unknown if currently breastfeeding.  Physical Exam  Nursing note and vitals reviewed. Constitutional: She is oriented to person, place, and time. She appears well-developed and well-nourished. No distress.  HENT:  Head: Normocephalic  and atraumatic.  Eyes: Conjunctivae are normal. Right eye exhibits no discharge. Left eye exhibits no discharge. No scleral icterus.  Neck: Normal range of motion.  Cardiovascular: Normal rate, regular rhythm and normal heart sounds.   No murmur heard. Respiratory: Effort normal and breath sounds normal. No respiratory distress. She has no wheezes.  GI: Soft. Bowel sounds are normal. There is no tenderness.  Neurological: She is alert and oriented to person, place, and time.  Skin: Skin is warm and dry. She is not diaphoretic.   Psychiatric: She has a normal mood and affect. Her behavior is normal. Judgment and thought content normal.   Fetal Tracing:  Baseline: 130 Variability: moderate Accelerations: 15x15 Decelerations:  none  Toco: none MAU Course  Procedures Results for orders placed or performed during the hospital encounter of 02/07/16 (from the past 24 hour(s))  Urinalysis, Routine w reflex microscopic     Status: Abnormal   Collection Time: 02/07/16  7:29 PM  Result Value Ref Range   Color, Urine YELLOW YELLOW   APPearance CLEAR CLEAR   Specific Gravity, Urine 1.019 1.005 - 1.030   pH 6.0 5.0 - 8.0   Glucose, UA NEGATIVE NEGATIVE mg/dL   Hgb urine dipstick NEGATIVE NEGATIVE   Bilirubin Urine NEGATIVE NEGATIVE   Ketones, ur 20 (A) NEGATIVE mg/dL   Protein, ur 30 (A) NEGATIVE mg/dL   Nitrite NEGATIVE NEGATIVE   Leukocytes, UA NEGATIVE NEGATIVE   RBC / HPF 0-5 0 - 5 RBC/hpf   WBC, UA 0-5 0 - 5 WBC/hpf   Bacteria, UA NONE SEEN NONE SEEN   Squamous Epithelial / LPF 0-5 (A) NONE SEEN   Mucous PRESENT   CBC     Status: Abnormal   Collection Time: 02/07/16  8:45 PM  Result Value Ref Range   WBC 6.7 4.0 - 10.5 K/uL   RBC 3.73 (L) 3.87 - 5.11 MIL/uL   Hemoglobin 10.8 (L) 12.0 - 15.0 g/dL   HCT 16.1 (L) 09.6 - 04.5 %   MCV 83.4 78.0 - 100.0 fL   MCH 29.0 26.0 - 34.0 pg   MCHC 34.7 30.0 - 36.0 g/dL   RDW 40.9 81.1 - 91.4 %   Platelets 199 150 - 400 K/uL  Comprehensive metabolic panel     Status: Abnormal   Collection Time: 02/07/16  9:38 PM  Result Value Ref Range   Sodium 133 (L) 135 - 145 mmol/L   Potassium 3.1 (L) 3.5 - 5.1 mmol/L   Chloride 103 101 - 111 mmol/L   CO2 24 22 - 32 mmol/L   Glucose, Bld 241 (H) 65 - 99 mg/dL   BUN 5 (L) 6 - 20 mg/dL   Creatinine, Ser 7.82 0.44 - 1.00 mg/dL   Calcium 7.9 (L) 8.9 - 10.3 mg/dL   Total Protein 5.6 (L) 6.5 - 8.1 g/dL   Albumin 2.4 (L) 3.5 - 5.0 g/dL   AST 15 15 - 41 U/L   ALT 11 (L) 14 - 54 U/L   Alkaline Phosphatase 52 38 - 126 U/L    Total Bilirubin <0.1 (L) 0.3 - 1.2 mg/dL   GFR calc non Af Amer >60 >60 mL/min   GFR calc Af Amer >60 >60 mL/min   Anion gap 6 5 - 15    MDM Category 1 fetal tracing ---- no ctx per toco or palpation IV D5LR bolus, zofran 4 mg IV, pepcid 20 mg IV  CBC, CMP Repeat CMP drawn d/t clotted specimen -- serum glucose 241, drawn  after pt received bag of D5LR Assessment and Plan  A: 1. Gastroenteritis, infectious, presumed   2. Heartburn during pregnancy in third trimester    P: Discharge home Rx zofran & zantac Advance diet as tolerated Discussed reasons to return to MAU  Judeth Horn 02/07/2016, 7:38 PM

## 2016-02-07 NOTE — MAU Note (Signed)
Been having sulfur burps for over a month.  When she has it, she has diarrhea with it.   Had 2 small meals yesterday.  Woke up today with the burps and diarrhea.  Water went straight through her. Eventually the diarrhea stopped. cramping in lower abd

## 2016-02-16 ENCOUNTER — Ambulatory Visit (INDEPENDENT_AMBULATORY_CARE_PROVIDER_SITE_OTHER): Payer: Medicaid Other | Admitting: Obstetrics & Gynecology

## 2016-02-16 VITALS — BP 118/74 | HR 91 | Wt 182.9 lb

## 2016-02-16 DIAGNOSIS — Z3480 Encounter for supervision of other normal pregnancy, unspecified trimester: Secondary | ICD-10-CM

## 2016-02-16 DIAGNOSIS — Z3483 Encounter for supervision of other normal pregnancy, third trimester: Secondary | ICD-10-CM

## 2016-02-16 MED ORDER — METOCLOPRAMIDE HCL 5 MG PO TABS: 5.0000 mg | ORAL_TABLET | Freq: Three times a day (TID) | ORAL | 2 refills | Status: DC

## 2016-02-16 NOTE — Progress Notes (Signed)
   PRENATAL VISIT NOTE  Subjective:cough with sputum no fever, decreased appetite, bloating and constipation  Joy Rivera is a 26 y.o. [id number] at 354w5d being seen today for ongoing prenatal care.  She is currently monitored for the following issues for this low-risk pregnancy and has Supervision of other normal pregnancy, antepartum; Rubella non-immune status, antepartum; Genital herpes; and Genital warts complicating pregnancy, second trimester on her problem list.  Patient reports poor appetite, feels full easily.  Contractions: Not present. Vag. Bleeding: None.  Movement: Present. Denies leaking of fluid.   The following portions of the patient's history were reviewed and updated as appropriate: allergies, current medications, past family history, past medical history, past social history, past surgical history and problem list. Problem list updated.  Objective:   Vitals:   02/16/16 0958  BP: 118/74  Pulse: 91  Weight: 182 lb 14.4 oz (83 kg)    Fetal Status: Fetal Heart Rate (bpm): 140   Movement: Present     General:  Alert, oriented and cooperative. Patient is in no acute distress.  Skin: Skin is warm and dry. No rash noted.   Cardiovascular: Normal heart rate noted  Respiratory: Normal respiratory effort, no problems with respiration noted  Abdomen: Soft, gravid, appropriate for gestational age. Pain/Pressure: Present     Pelvic:  Cervical exam deferred        Extremities: Normal range of motion.     Mental Status: Normal mood and affect. Normal behavior. Normal judgment and thought content.   Assessment and Plan:  Pregnancy: G5P1031 at 124w5d  1. Supervision of other normal pregnancy, antepartum Constipation and bloating early satiety - metoCLOPramide (REGLAN) 5 MG tablet; Take 1 tablet (5 mg total) by mouth 3 (three) times daily before meals.  Dispense: 60 tablet; Refill: 2  Preterm labor symptoms and general obstetric precautions including but not limited to vaginal  bleeding, contractions, leaking of fluid and fetal movement were reviewed in detail with the patient. Please refer to After Visit Summary for other counseling recommendations.  Return in about 2 weeks (around 03/01/2016).   Adam PhenixJames G Armond Cuthrell, MD

## 2016-02-16 NOTE — Patient Instructions (Signed)

## 2016-02-29 ENCOUNTER — Inpatient Hospital Stay (HOSPITAL_COMMUNITY)
Admission: AD | Admit: 2016-02-29 | Discharge: 2016-02-29 | Disposition: A | Discharge status: Home / Self Care | Payer: Medicaid Other | Source: Ambulatory Visit | Attending: Obstetrics & Gynecology | Admitting: Obstetrics & Gynecology

## 2016-02-29 ENCOUNTER — Encounter (HOSPITAL_COMMUNITY): Payer: Self-pay

## 2016-02-29 ENCOUNTER — Inpatient Hospital Stay (HOSPITAL_COMMUNITY): Payer: Medicaid Other

## 2016-02-29 ENCOUNTER — Other Ambulatory Visit (HOSPITAL_COMMUNITY)
Admission: RE | Admit: 2016-02-29 | Discharge: 2016-02-29 | Disposition: A | Discharge status: Home / Self Care | Payer: Medicaid Other | Source: Ambulatory Visit | Attending: Certified Nurse Midwife | Admitting: Certified Nurse Midwife

## 2016-02-29 ENCOUNTER — Ambulatory Visit (INDEPENDENT_AMBULATORY_CARE_PROVIDER_SITE_OTHER): Payer: Medicaid Other | Admitting: Obstetrics & Gynecology

## 2016-02-29 VITALS — BP 105/70 | HR 104 | Temp 97.6°F | Wt 184.3 lb

## 2016-02-29 DIAGNOSIS — Z3689 Encounter for other specified antenatal screening: Secondary | ICD-10-CM

## 2016-02-29 DIAGNOSIS — O368130 Decreased fetal movements, third trimester, not applicable or unspecified: Secondary | ICD-10-CM | POA: Insufficient documentation

## 2016-02-29 DIAGNOSIS — O26893 Other specified pregnancy related conditions, third trimester: Secondary | ICD-10-CM | POA: Insufficient documentation

## 2016-02-29 DIAGNOSIS — Z3A32 32 weeks gestation of pregnancy: Secondary | ICD-10-CM | POA: Insufficient documentation

## 2016-02-29 DIAGNOSIS — N898 Other specified noninflammatory disorders of vagina: Secondary | ICD-10-CM | POA: Insufficient documentation

## 2016-02-29 DIAGNOSIS — O368190 Decreased fetal movements, unspecified trimester, not applicable or unspecified: Secondary | ICD-10-CM

## 2016-02-29 DIAGNOSIS — O4703 False labor before 37 completed weeks of gestation, third trimester: Secondary | ICD-10-CM

## 2016-02-29 DIAGNOSIS — O26853 Spotting complicating pregnancy, third trimester: Secondary | ICD-10-CM | POA: Insufficient documentation

## 2016-02-29 DIAGNOSIS — Z3483 Encounter for supervision of other normal pregnancy, third trimester: Secondary | ICD-10-CM

## 2016-02-29 DIAGNOSIS — O283 Abnormal ultrasonic finding on antenatal screening of mother: Secondary | ICD-10-CM

## 2016-02-29 DIAGNOSIS — Z3480 Encounter for supervision of other normal pregnancy, unspecified trimester: Secondary | ICD-10-CM

## 2016-02-29 DIAGNOSIS — O4693 Antepartum hemorrhage, unspecified, third trimester: Secondary | ICD-10-CM | POA: Diagnosis present

## 2016-02-29 DIAGNOSIS — A601 Herpesviral infection of perianal skin and rectum: Secondary | ICD-10-CM

## 2016-02-29 LAB — URINALYSIS, ROUTINE W REFLEX MICROSCOPIC
Bacteria, UA: NONE SEEN
Bilirubin Urine: NEGATIVE
Glucose, UA: NEGATIVE mg/dL
HGB URINE DIPSTICK: NEGATIVE
Ketones, ur: NEGATIVE mg/dL
Leukocytes, UA: NEGATIVE
Nitrite: NEGATIVE
PROTEIN: 30 mg/dL — AB
RBC / HPF: NONE SEEN RBC/hpf (ref 0–5)
Specific Gravity, Urine: 1.02 (ref 1.005–1.030)
Squamous Epithelial / LPF: NONE SEEN
pH: 6 (ref 5.0–8.0)

## 2016-02-29 LAB — WET PREP, GENITAL
Clue Cells Wet Prep HPF POC: NONE SEEN
Sperm: NONE SEEN
Trich, Wet Prep: NONE SEEN
YEAST WET PREP: NONE SEEN

## 2016-02-29 NOTE — Addendum Note (Signed)
Addended by: Elby BeckPAUL, Junice Fei F on: 02/29/2016 04:50 PM   Modules accepted: Orders

## 2016-02-29 NOTE — Progress Notes (Signed)
   PRENATAL VISIT NOTE  Subjective:  Joy Rivera is a 26 y.o. [phone] at 8743w4d being seen today for ongoing prenatal care.  She is currently monitored for the following issues for this low-risk pregnancy and has Supervision of other normal pregnancy, antepartum; Rubella non-immune status, antepartum; Genital herpes; and Genital warts complicating pregnancy, second trimester on her problem list.  Patient reports vaginal irritation.  Contractions: Irregular. Vag. Bleeding: None.  Movement: Present. Denies leaking of fluid.   The following portions of the patient's history were reviewed and updated as appropriate: allergies, current medications, past family history, past medical history, past social history, past surgical history and problem list. Problem list updated.  Objective:   Vitals:   02/29/16 1353  BP: 105/70  Pulse: (!) 104  Temp: 97.6 F (36.4 C)  Weight: 184 lb 4.8 oz (83.6 kg)    Fetal Status:     Movement: Present     General:  Alert, oriented and cooperative. Patient is in no acute distress.  Skin: Skin is warm and dry. No rash noted.   Cardiovascular: Normal heart rate noted  Respiratory: Normal respiratory effort, no problems with respiration noted  Abdomen: Soft, gravid, appropriate for gestational age. Pain/Pressure: Present     Pelvic:  Cervical exam performed      1 1/2/ th/-2/large amount of clear, snot-like discharge  Extremities: Normal range of motion.  Edema: Trace  Mental Status: Normal mood and affect. Normal behavior. Normal judgment and thought content.   Assessment and Plan:  Pregnancy: G5P1031 at 1243w4d  1. Herpes simplex infection of perianal skin   2. Supervision of other normal pregnancy, antepartum 3. PT contractions with some cervical change- she will go to the MAU now to have further evaluation.  Preterm labor symptoms and general obstetric precautions including but not limited to vaginal bleeding, contractions, leaking of fluid and  fetal movement were reviewed in detail with the patient. Please refer to After Visit Summary for other counseling recommendations.  No Follow-up on file.   Allie BossierMyra C Holland Nickson, MD

## 2016-02-29 NOTE — MAU Provider Note (Signed)
Chief Complaint:  Contractions   First Provider Initiated Contact with Patient 02/29/16 1530      HPI: Joy Rivera is a 26 y.o. G5P1031 at [redacted]w[redacted]d who presents to maternity admissions sent from the office for vaginal bleeding with mucus starting yesterday and cramping/contractions starting this morning. She also reports that fetal movements have decreased over the last week from 1-2/hour a week ago to only 1-2 movements per day for the last 2 days.  She describes the bleeding as red, mixed with mucous and scant last night but with more noted today when wiping and on a pad, but she has not saturated a pad all day today. Nothing makes the bleeding better or worse. It is associated with cramping. The cramping is intermittent and worsening gradually but irregular and hard to time. She has tried resting, changing positions, and drinking more fluids but these have not helped.   She reports intermittent diarrhea as often as 4x/day and as little as 1/day starting 3-4 weeks ago. She has not tried any treatments for this and certain foods seem to make it worse. She denies LOF, vaginal itching/burning, urinary symptoms, h/a, dizziness, n/v, or fever/chills.    HPI  Past Medical History: Past Medical History:  Diagnosis Date  . Abnormal Pap smear   . Anxiety   . Depression     Past obstetric history: OB History  Gravida Para Term Preterm AB Living  5 1 1  0 3 1  SAB TAB Ectopic Multiple Live Births  0 2 1 0 1    # Outcome Date GA Lbr Len/2nd Weight Sex Delivery Anes PTL Lv  5 Current           4 Term 11/12/12 [redacted]w[redacted]d 21:40 / 01:08 7 lb 8.1 oz (3.405 kg) M Vag-Spont EPI  LIV  3 Ectopic           2 TAB           1 TAB               Past Surgical History: Past Surgical History:  Procedure Laterality Date  . NO PAST SURGERIES      Family History: Family History  Problem Relation Age of Onset  . Hypertension Father   . Heart disease Father   . Cancer Maternal Aunt   . Cancer Maternal  Uncle   . Hypertension Paternal Grandmother   . Cancer Maternal Grandmother   . Cancer Maternal Grandfather     Social History: Social History  Substance Use Topics  . Smoking status: Never Smoker  . Smokeless tobacco: Never Used  . Alcohol use No    Allergies: No Known Allergies  Meds:  Prescriptions Prior to Admission  Medication Sig Dispense Refill Last Dose  . metoCLOPramide (REGLAN) 5 MG tablet Take 1 tablet (5 mg total) by mouth 3 (three) times daily before meals. 60 tablet 2   . ondansetron (ZOFRAN ODT) 4 MG disintegrating tablet Take 1 tablet (4 mg total) by mouth every 8 (eight) hours as needed for nausea or vomiting. (Patient not taking: Reported on 02/16/2016) 15 tablet 0 Not Taking  . Prenatal Multivit-Min-Fe-FA (PRENATAL VITAMINS) 0.8 MG tablet Take 1 tablet by mouth daily. 30 tablet 12 Taking  . ranitidine (ZANTAC) 150 MG tablet Take 1 tablet (150 mg total) by mouth 2 (two) times daily. (Patient not taking: Reported on 02/16/2016) 60 tablet 0 Not Taking    ROS:  Review of Systems  Constitutional: Negative for chills, fatigue and fever.  Eyes: Negative for visual disturbance.  Respiratory: Negative for shortness of breath.   Cardiovascular: Negative for chest pain.  Gastrointestinal: Positive for abdominal pain. Negative for nausea and vomiting.  Genitourinary: Positive for pelvic pain, vaginal bleeding and vaginal discharge. Negative for difficulty urinating, dysuria, flank pain and vaginal pain.  Neurological: Negative for dizziness and headaches.  Psychiatric/Behavioral: Negative.      I have reviewed patient's Past Medical Hx, Surgical Hx, Family Hx, Social Hx, medications and allergies.   Physical Exam  Patient Vitals for the past 24 hrs:  BP Temp Temp src Pulse Resp  02/29/16 1520 120/66 97.8 F (36.6 C) Oral (!) 130 18   Constitutional: Well-developed, well-nourished female in no acute distress.  Cardiovascular: normal rate Respiratory: normal  effort GI: Abd soft, non-tender, gravid appropriate for gestational age.  MS: Extremities nontender, no edema, normal ROM Neurologic: Alert and oriented x 4.  GU: Neg CVAT.  PELVIC EXAM: Pelvic exam: Cervix pink, visually closed, without lesion, scant pink bleeding, vaginal walls and external genitalia normal Dilation: 1.5 Effacement (%): 50 Station: Ballotable Exam by:: Kayren EavesAshley Garvey RN      FHT:  Baseline 150 , moderate variability, 10 x 10 but no 15x15 accels present, no decelerations Contractions: occasional irritability, mild to palpation   Labs: Results for orders placed or performed during the hospital encounter of 02/29/16 (from the past 24 hour(s))  Urinalysis, Routine w reflex microscopic     Status: Abnormal   Collection Time: 02/29/16  3:00 PM  Result Value Ref Range   Color, Urine YELLOW YELLOW   APPearance CLEAR CLEAR   Specific Gravity, Urine 1.020 1.005 - 1.030   pH 6.0 5.0 - 8.0   Glucose, UA NEGATIVE NEGATIVE mg/dL   Hgb urine dipstick NEGATIVE NEGATIVE   Bilirubin Urine NEGATIVE NEGATIVE   Ketones, ur NEGATIVE NEGATIVE mg/dL   Protein, ur 30 (A) NEGATIVE mg/dL   Nitrite NEGATIVE NEGATIVE   Leukocytes, UA NEGATIVE NEGATIVE   RBC / HPF NONE SEEN 0 - 5 RBC/hpf   WBC, UA 0-5 0 - 5 WBC/hpf   Bacteria, UA NONE SEEN NONE SEEN   Squamous Epithelial / LPF NONE SEEN NONE SEEN   Mucous PRESENT    A/Positive/-- (09/21 1606)  Imaging:   BPP 8/8 but findings of dilated loops of bowel and enlarged stomach, c/w small bowel obstruction.    MAU Course/MDM: I have ordered labs BPP and reviewed results.  NST reviewed Cervix unchanged in 2-3 hours in MAU Consult Dr Despina HiddenEure with presentation, exam findings and test results.  D/C home with scheduled f/u US in [facility] next week related to possible bowel obstruction. Dr Sherrie Georgeecker discussed US results and I reviewed them as well with the pt and her husband while they were in MAU. Questions answered. No evidence of preterm labor  today but preterm labor precautions reviewed   Pt stable at time of discharge.  Today's evaluation included a work-up for preterm labor which can be life-threatening for both mom and baby.  Assessment:  1. Spotting affecting pregnancy in third trimester   2. Decreased fetal movement   3. Abnormal fetal ultrasound   4. NST (non-stress test) reactive   5. Threatened preterm labor, third trimester    Plan: Discharge home Labor precautions and fetal kick counts    Sharen CounterLisa Leftwich-Kirby Certified Nurse-Midwife 02/29/2016 3:51 PM

## 2016-02-29 NOTE — Discharge Instructions (Signed)
. °Preterm Labor and Birth Information °The normal length of a pregnancy is 39-41 weeks. Preterm labor is when labor starts before 37 completed weeks of pregnancy. °What are the risk factors for preterm labor? °Preterm labor is more likely to occur in women who: °· Have certain infections during pregnancy such as a bladder infection, sexually transmitted infection, or infection inside the uterus (chorioamnionitis). °· Have a shorter-than-normal cervix. °· Have gone into preterm labor before. °· Have had surgery on their cervix. °· Are younger than age 17 or older than age 35. °· Are African American. °· Are pregnant with twins or multiple babies (multiple gestation). °· Take street drugs or smoke while pregnant. °· Do not gain enough weight while pregnant. °· Became pregnant shortly after having been pregnant. °What are the symptoms of preterm labor? °Symptoms of preterm labor include: °· Cramps similar to those that can happen during a menstrual period. The cramps may happen with diarrhea. °· Pain in the abdomen or lower back. °· Regular uterine contractions that may feel like tightening of the abdomen. °· A feeling of increased pressure in the pelvis. °· Increased watery or bloody mucus discharge from the vagina. °· Water breaking (ruptured amniotic sac). °Why is it important to recognize signs of preterm labor? °It is important to recognize signs of preterm labor because babies who are born prematurely may not be fully developed. This can put them at an increased risk for: °· Long-term (chronic) heart and lung problems. °· Difficulty immediately after birth with regulating body systems, including blood sugar, body temperature, heart rate, and breathing rate. °· Bleeding in the brain. °· Cerebral palsy. °· Learning difficulties. °· Death. °These risks are highest for babies who are born before 34 weeks of pregnancy. °How is preterm labor treated? °Treatment depends on the length of your pregnancy, your condition,  and the health of your baby. It may involve: °· Having a stitch (suture) placed in your cervix to prevent your cervix from opening too early (cerclage). °· Taking or being given medicines, such as: °¨ Hormone medicines. These may be given early in pregnancy to help support the pregnancy. °¨ Medicine to stop contractions. °¨ Medicines to help mature the baby’s lungs. These may be prescribed if the risk of delivery is high. °¨ Medicines to prevent your baby from developing cerebral palsy. °If the labor happens before 34 weeks of pregnancy, you may need to stay in the hospital. °What should I do if I think I am in preterm labor? °If you think that you are going into preterm labor, call your health care provider right away. °How can I prevent preterm labor in future pregnancies? °To increase your chance of having a full-term pregnancy: °· Do not use any tobacco products, such as cigarettes, chewing tobacco, and e-cigarettes. If you need help quitting, ask your health care provider. °· Do not use street drugs or medicines that have not been prescribed to you during your pregnancy. °· Talk with your health care provider before taking any herbal supplements, even if you have been taking them regularly. °· Make sure you gain a healthy amount of weight during your pregnancy. °· Watch for infection. If you think that you might have an infection, get it checked right away. °· Make sure to tell your health care provider if you have gone into preterm labor before. °This information is not intended to replace advice given to you by your health care provider. Make sure you discuss any questions you have with your   health care provider. °Document Released: 04/14/2003 Document Revised: 07/05/2015 Document Reviewed: 06/15/2015 °Elsevier Interactive Patient Education © 2017 [employer] ° °

## 2016-02-29 NOTE — MAU Note (Signed)
Patient presents with c/o ctxs. Patient states that she was in the offfice today and her cervix was 1.5 cm. Patient is also having some bloody show that started last night. Patient states she is also having some decreased fetal movement for the past week.

## 2016-03-01 ENCOUNTER — Inpatient Hospital Stay (EMERGENCY_DEPARTMENT_HOSPITAL)
Admission: AD | Admit: 2016-03-01 | Discharge: 2016-03-01 | Disposition: A | Discharge status: Home / Self Care | Payer: Medicaid Other | Source: Ambulatory Visit | Attending: Obstetrics and Gynecology | Admitting: Obstetrics and Gynecology

## 2016-03-01 ENCOUNTER — Other Ambulatory Visit (HOSPITAL_COMMUNITY): Payer: Self-pay | Admitting: *Deleted

## 2016-03-01 ENCOUNTER — Encounter (HOSPITAL_COMMUNITY): Payer: Self-pay

## 2016-03-01 DIAGNOSIS — O4703 False labor before 37 completed weeks of gestation, third trimester: Secondary | ICD-10-CM | POA: Diagnosis not present

## 2016-03-01 DIAGNOSIS — O35DXX0 Maternal care for other (suspected) fetal abnormality and damage, fetal gastrointestinal anomalies, not applicable or unspecified: Secondary | ICD-10-CM

## 2016-03-01 DIAGNOSIS — O4693 Antepartum hemorrhage, unspecified, third trimester: Secondary | ICD-10-CM

## 2016-03-01 DIAGNOSIS — O358XX0 Maternal care for other (suspected) fetal abnormality and damage, not applicable or unspecified: Secondary | ICD-10-CM

## 2016-03-01 LAB — GC/CHLAMYDIA PROBE AMP (~~LOC~~) NOT AT ARMC
Chlamydia: NEGATIVE
Neisseria Gonorrhea: NEGATIVE

## 2016-03-01 LAB — URINALYSIS, ROUTINE W REFLEX MICROSCOPIC
BILIRUBIN URINE: NEGATIVE
GLUCOSE, UA: NEGATIVE mg/dL
KETONES UR: NEGATIVE mg/dL
NITRITE: NEGATIVE
PROTEIN: NEGATIVE mg/dL
Specific Gravity, Urine: 1.006 (ref 1.005–1.030)
pH: 7 (ref 5.0–8.0)

## 2016-03-01 MED ORDER — NIFEDIPINE 10 MG PO CAPS
10.0000 mg | ORAL_CAPSULE | Freq: Once | ORAL | Status: AC
  Administered 2016-03-01: 10 mg via ORAL
  Filled 2016-03-01: qty 1

## 2016-03-01 MED ORDER — NIFEDIPINE 10 MG PO CAPS: 10.0000 mg | ORAL_CAPSULE | Freq: Four times a day (QID) | ORAL | 0 refills | Status: DC | PRN

## 2016-03-01 NOTE — Discharge Instructions (Signed)
Preterm Labor and Birth Information °Pregnancy normally lasts 39-41 weeks. Preterm labor is when labor starts early. It starts before you have been pregnant for 37 whole weeks. °What are the risk factors for preterm labor? °Preterm labor is more likely to occur in women who: °· Have an infection while pregnant. °· Have a cervix that is short. °· Have gone into preterm labor before. °· Have had surgery on their cervix. °· Are younger than age 25. °· Are older than age 35. °· Are African American. °· Are pregnant with two or more babies. °· Take street drugs while pregnant. °· Smoke while pregnant. °· Do not gain enough weight while pregnant. °· Got pregnant right after another pregnancy. °What are the symptoms of preterm labor? °Symptoms of preterm labor include: °· Cramps. The cramps may feel like the cramps some women get during their period. The cramps may happen with watery poop (diarrhea). °· Pain in the belly (abdomen). °· Pain in the lower back. °· Regular contractions or tightening. It may feel like your belly is getting tighter. °· Pressure in the lower belly that seems to get stronger. °· More fluid (discharge) leaking from the vagina. The fluid may be watery or bloody. °· Water breaking. °Why is it important to notice signs of preterm labor? °Babies who are born early may not be fully developed. They have a higher chance for: °· Long-term heart problems. °· Long-term lung problems. °· Trouble controlling body systems, like breathing. °· Bleeding in the brain. °· A condition called cerebral palsy. °· Learning difficulties. °· Death. °These risks are highest for babies who are born before 34 weeks of pregnancy. °How is preterm labor treated? °Treatment depends on: °· How long you were pregnant. °· Your condition. °· The health of your baby. °Treatment may involve: °· Having a stitch (suture) placed in your cervix. When you give birth, your cervix opens so the baby can come out. The stitch keeps the cervix  from opening too soon. °· Staying at the hospital. °· Taking or getting medicines, such as: °¨ Hormone medicines. °¨ Medicines to stop contractions. °¨ Medicines to help the baby’s lungs develop. °¨ Medicines to prevent your baby from having cerebral palsy. °What should I do if I am in preterm labor? °If you think you are going into labor too soon, call your doctor right away. °How can I prevent preterm labor? °· Do not use any tobacco products. °¨ Examples of these are cigarettes, chewing tobacco, and e-cigarettes. °¨ If you need help quitting, ask your doctor. °· Do not use street drugs. °· Do not use any medicines unless you ask your doctor if they are safe for you. °· Talk with your doctor before taking any herbal supplements. °· Make sure you gain enough weight. °· Watch for infection. If you think you might have an infection, get it checked right away. °· If you have gone into preterm labor before, tell your doctor. °This information is not intended to replace advice given to you by your health care provider. Make sure you discuss any questions you have with your health care provider. °Document Released: 04/20/2008 Document Revised: 07/05/2015 Document Reviewed: 06/15/2015 °Elsevier Interactive Patient Education © 2017 [employer]vier Inc. ° °

## 2016-03-01 NOTE — MAU Note (Signed)
Pt reports she was seen for ctx yesterday and they sent her home.  Had some bloody discgharge yesterday still having ctxx and bleeding today. Ctx are stronger and closer today. She was 1.5/50 on SVE.

## 2016-03-01 NOTE — MAU Provider Note (Signed)
Chief Complaint:  Contractions   First Provider Initiated Contact with Patient 03/01/16 1605     HPI: Joy Rivera is a 26 y.o. G5P1031 at 7032w5dwho presents to maternity admissions reporting contractions with bloody mucous.  States they are stronger and closer together today..  She reports good fetal movement, denies LOF, vaginal bleeding, vaginal itching/burning, urinary symptoms, h/a, dizziness, n/v, diarrhea, constipation or fever/chills.  She denies headache, visual changes or RUQ abdominal pain.  Abdominal Pain  This is a recurrent problem. The current episode started yesterday. The onset quality is gradual. The problem occurs intermittently. The problem has been waxing and waning. The pain is located in the suprapubic region. The pain is mild. The quality of the pain is cramping. The abdominal pain does not radiate. Pertinent negatives include no diarrhea, dysuria, fever, nausea or vomiting. Nothing aggravates the pain. The pain is relieved by nothing. She has tried nothing for the symptoms.   RN Note: Pt reports she was seen for ctx yesterday and they sent her home.  Had some bloody discgharge yesterday still having ctxx and bleeding today. Ctx are stronger and closer today. She was 1.5/50 on SVE.  Past Medical History: Past Medical History:  Diagnosis Date  . Abnormal Pap smear   . Anxiety   . Depression     Past obstetric history: OB History  Gravida Para Term Preterm AB Living  5 1 1  0 3 1  SAB TAB Ectopic Multiple Live Births  0 2 1 0 1    # Outcome Date GA Lbr Len/2nd Weight Sex Delivery Anes PTL Lv  5 Current           4 Term 11/12/12 6038w6d 21:40 / 01:08 7 lb 8.1 oz (3.405 kg) M Vag-Spont EPI  LIV  3 Ectopic           2 TAB           1 TAB               Past Surgical History: Past Surgical History:  Procedure Laterality Date  . NO PAST SURGERIES      Family History: Family History  Problem Relation Age of Onset  . Hypertension Father   . Heart disease  Father   . Cancer Maternal Aunt   . Cancer Maternal Uncle   . Hypertension Paternal Grandmother   . Cancer Maternal Grandmother   . Cancer Maternal Grandfather     Social History: Social History  Substance Use Topics  . Smoking status: Never Smoker  . Smokeless tobacco: Never Used  . Alcohol use No    Allergies: No Known Allergies  Meds:  Prescriptions Prior to Admission  Medication Sig Dispense Refill Last Dose  . Prenatal Multivit-Min-Fe-FA (PRENATAL VITAMINS) 0.8 MG tablet Take 1 tablet by mouth daily. 30 tablet 12 Past Week at Unknown time    I have reviewed patient's Past Medical Hx, Surgical Hx, Family Hx, Social Hx, medications and allergies.   ROS:  Review of Systems  Constitutional: Negative for chills and fever.  Respiratory: Negative for shortness of breath.   Gastrointestinal: Positive for abdominal pain. Negative for diarrhea, nausea and vomiting.  Genitourinary: Positive for pelvic pain and vaginal bleeding. Negative for dysuria, flank pain, vaginal discharge and vaginal pain.   Other systems negative  Physical Exam  Patient Vitals for the past 24 hrs:  BP Temp Temp src Pulse Resp Height Weight  03/01/16 1555 108/65 97.5 F (36.4 C) Axillary 94 18 5\' 3"  (  1.6 m) 187 lb 12.8 oz (85.2 kg)   Constitutional: Well-developed, well-nourished female in no acute distress.  Cardiovascular: normal rate and rhythm Respiratory: normal effort, clear to auscultation bilaterally GI: Abd soft, non-tender, gravid appropriate for gestational age.   No rebound or guarding. MS: Extremities nontender, no edema, normal ROM Neurologic: Alert and oriented x 4.  GU: Neg CVAT.  PELVIC EXAM: Speculum exam:  Dark red mucous/blood at cervix, no pooling Dilation: 1.5 Effacement (%): 50, 60 Station: -3 Presentation: Vertex Exam by:: Sharilyn Sites CNM      FHT:  Baseline 135 , moderate variability, accelerations present, no decelerations Contractions:  Irregular      Labs: Results for orders placed or performed during the hospital encounter of 02/29/16 (from the past 24 hour(s))  Wet prep, genital     Status: Abnormal   Collection Time: 02/29/16  5:41 PM  Result Value Ref Range   Yeast Wet Prep HPF POC NONE SEEN NONE SEEN   Trich, Wet Prep NONE SEEN NONE SEEN   Clue Cells Wet Prep HPF POC NONE SEEN NONE SEEN   WBC, Wet Prep HPF POC FEW (A) NONE SEEN   Sperm NONE SEEN    A/Positive/-- (09/21 1606)  Imaging:  [place] Mfm Fetal Bpp Wo Non Stress  Result Date: 03/01/2016 ----------------------------------------------------------------------  OBSTETRICS REPORT                      (Signed Final 03/01/2016 09:17 am) ---------------------------------------------------------------------- Patient Info  ID #:       [id number]                         D.O.B.:   03-08-1990 (25 yrs)  Name:       Joy Rivera              Visit Date:  02/29/2016 03:56 pm ---------------------------------------------------------------------- Performed By  Performed By:     Jodean Lima          Secondary Phy.:   RACHELLE A                    RDMS                                                             [facility] CNM  Attending:        Particia Nearing MD       Address:          [address]                                                             [place]  [address]  Referred By:      MAU Nursing-           Location:         [facility]                    MAU/Triage ---------------------------------------------------------------------- Orders   #  Description                                 Code   1  [place] MFM FETAL BPP WO NON STRESS              76819.01  ----------------------------------------------------------------------   #  Ordered By               Order #        Accession #    Episode #   1  LISA LEFTWICH-           [id number]      [id number]      [id number]      KIRBY  ---------------------------------------------------------------------- Indications   [redacted] weeks gestation of pregnancy                Z3A.32   Decreased fetal movement                       O36.8190   Vaginal bleeding in pregnancy, third trimester O46.93   Preterm contractions                           O47.00  ---------------------------------------------------------------------- OB History  Gravidity:    5         Term:   1        Prem:   0        SAB:   0  TOP:          2       Ectopic:  1        Living: 1 ---------------------------------------------------------------------- Fetal Evaluation  Num Of Fetuses:     1  Fetal Heart         152  Rate(bpm):  Cardiac Activity:   Observed  Presentation:       Cephalic  Placenta:           Anterior, above cervical os  P. Cord Insertion:  Visualized, central  Amniotic Fluid  AFI FV:      Mild polyhydramnios                              Largest Pocket(cm)                              10.17 ---------------------------------------------------------------------- Biophysical Evaluation  Amniotic F.V:   Polyhydramnios             F. Tone:        Observed  F. Movement:    Observed                   Score:          8/8  F. Breathing:   Observed ---------------------------------------------------------------------- Gestational Age  LMP:           32w 4d       Date:   07/16/15  EDD:   04/21/16  Best:          Armida Sans 4d    Det. By:   LMP  (07/16/15)          EDD:   04/21/16 ---------------------------------------------------------------------- Anatomy  Stomach:               Enlarged               Abdomen:                Dilated bowel ---------------------------------------------------------------------- Cervix Uterus Adnexa  Cervix  Not adaquately visualized  Uterus  No abnormality visualized.  Left Ovary  Within normal limits.  Right Ovary  Within normal limits.  Adnexa:       No abnormality visualized.  ---------------------------------------------------------------------- Impression  SIUP at 32+4 weeks  Cephalic presentation  Enlarged stomach; loops of dilated bowel; suspect small  bowel obstruction  Mild polyhydramnios  BPP 8/8  The US findings were shared with Ms. [place]. ---------------------------------------------------------------------- Recommendations  Follow-up ultrasound on 01/31 to reassess fetal anatomy ----------------------------------------------------------------------                 Particia Nearing, MD Electronically Signed Final Report   03/01/2016 09:17 am ----------------------------------------------------------------------   MAU Course/MDM: I have ordered labs and reviewed results.  NST reviewed Procardia given @ 1644. UCs almost totally resolved after one dose Will discharge home with Rx for PRN Procardia  Assessment: SIUP at [redacted]w[redacted]d Preterm uterine contractions, no further dilation of cervix Bleeding in the third trimester, likely due to preterm contractions  Plan: Discharge home Preterm Labor precautions and fetal kick counts Follow up in Office for prenatal visits and recheck of cervix  Encouraged to return here or to other Urgent Care/ED if she develops worsening of symptoms, increase in pain, fever, or other concerning symptoms.   Pt stable at time of discharge.  Wynelle Bourgeois CNM, MSN Certified Nurse-Midwife 03/01/2016 4:24 PM

## 2016-03-02 ENCOUNTER — Inpatient Hospital Stay (HOSPITAL_COMMUNITY)
Admission: AD | Admit: 2016-03-02 | Discharge: 2016-03-05 | DRG: 774 | Disposition: A | Discharge status: Home / Self Care | Payer: Medicaid Other | Source: Ambulatory Visit | Attending: Obstetrics & Gynecology | Admitting: Obstetrics & Gynecology

## 2016-03-02 ENCOUNTER — Encounter (HOSPITAL_COMMUNITY): Payer: Self-pay

## 2016-03-02 DIAGNOSIS — O42913 Preterm premature rupture of membranes, unspecified as to length of time between rupture and onset of labor, third trimester: Secondary | ICD-10-CM | POA: Diagnosis present

## 2016-03-02 DIAGNOSIS — A630 Anogenital (venereal) warts: Secondary | ICD-10-CM | POA: Diagnosis present

## 2016-03-02 DIAGNOSIS — O9832 Other infections with a predominantly sexual mode of transmission complicating childbirth: Secondary | ICD-10-CM | POA: Diagnosis present

## 2016-03-02 DIAGNOSIS — O4703 False labor before 37 completed weeks of gestation, third trimester: Secondary | ICD-10-CM | POA: Diagnosis not present

## 2016-03-02 DIAGNOSIS — O4593 Premature separation of placenta, unspecified, third trimester: Secondary | ICD-10-CM | POA: Diagnosis present

## 2016-03-02 DIAGNOSIS — A6000 Herpesviral infection of urogenital system, unspecified: Secondary | ICD-10-CM | POA: Diagnosis present

## 2016-03-02 DIAGNOSIS — Z3480 Encounter for supervision of other normal pregnancy, unspecified trimester: Secondary | ICD-10-CM

## 2016-03-02 DIAGNOSIS — O359XX0 Maternal care for (suspected) fetal abnormality and damage, unspecified, not applicable or unspecified: Secondary | ICD-10-CM | POA: Diagnosis present

## 2016-03-02 DIAGNOSIS — Z283 Underimmunization status: Secondary | ICD-10-CM

## 2016-03-02 DIAGNOSIS — Z3A32 32 weeks gestation of pregnancy: Secondary | ICD-10-CM | POA: Diagnosis not present

## 2016-03-02 DIAGNOSIS — O358XX0 Maternal care for other (suspected) fetal abnormality and damage, not applicable or unspecified: Secondary | ICD-10-CM | POA: Diagnosis present

## 2016-03-02 DIAGNOSIS — O09899 Supervision of other high risk pregnancies, unspecified trimester: Secondary | ICD-10-CM

## 2016-03-02 DIAGNOSIS — O9989 Other specified diseases and conditions complicating pregnancy, childbirth and the puerperium: Secondary | ICD-10-CM

## 2016-03-02 LAB — TYPE AND SCREEN
ABO/RH(D): A POS
Antibody Screen: NEGATIVE

## 2016-03-02 LAB — CBC
HCT: 33.8 % — ABNORMAL LOW (ref 36.0–46.0)
HEMOGLOBIN: 11.5 g/dL — AB (ref 12.0–15.0)
MCH: 28.1 pg (ref 26.0–34.0)
MCHC: 34 g/dL (ref 30.0–36.0)
MCV: 82.6 fL (ref 78.0–100.0)
Platelets: 241 10*3/uL (ref 150–400)
RBC: 4.09 MIL/uL (ref 3.87–5.11)
RDW: 12.7 % (ref 11.5–15.5)
WBC: 9.4 10*3/uL (ref 4.0–10.5)

## 2016-03-02 LAB — CERVICOVAGINAL ANCILLARY ONLY
Bacterial vaginitis: NEGATIVE
Candida vaginitis: NEGATIVE

## 2016-03-02 LAB — RAPID URINE DRUG SCREEN, HOSP PERFORMED
AMPHETAMINES: NOT DETECTED
BARBITURATES: NOT DETECTED
BENZODIAZEPINES: NOT DETECTED
COCAINE: NOT DETECTED
OPIATES: NOT DETECTED
TETRAHYDROCANNABINOL: POSITIVE — AB

## 2016-03-02 MED ORDER — BUTORPHANOL TARTRATE 1 MG/ML IJ SOLN
INTRAMUSCULAR | Status: AC
  Filled 2016-03-02: qty 1

## 2016-03-02 MED ORDER — LACTATED RINGERS IV SOLN
INTRAVENOUS | Status: DC
  Administered 2016-03-02: 50 mL/h via INTRAVENOUS
  Administered 2016-03-03 – 2016-03-04 (×3): via INTRAVENOUS

## 2016-03-02 MED ORDER — BETAMETHASONE SOD PHOS & ACET 6 (3-3) MG/ML IJ SUSP
12.0000 mg | INTRAMUSCULAR | Status: AC
  Administered 2016-03-02 – 2016-03-03 (×2): 12 mg via INTRAMUSCULAR
  Filled 2016-03-02 (×2): qty 2

## 2016-03-02 MED ORDER — TERBUTALINE SULFATE 1 MG/ML IJ SOLN
0.2500 mg | Freq: Once | INTRAMUSCULAR | Status: AC
  Administered 2016-03-02: 0.25 mg via SUBCUTANEOUS
  Filled 2016-03-02: qty 1

## 2016-03-02 MED ORDER — BUTORPHANOL TARTRATE 1 MG/ML IJ SOLN
1.0000 mg | Freq: Once | INTRAMUSCULAR | Status: AC
  Administered 2016-03-02: 1 mg via INTRAVENOUS

## 2016-03-02 MED ORDER — CALCIUM CARBONATE ANTACID 500 MG PO CHEW: 2.0000 | CHEWABLE_TABLET | ORAL | Status: DC | PRN

## 2016-03-02 MED ORDER — VALACYCLOVIR HCL 500 MG PO TABS
500.0000 mg | ORAL_TABLET | Freq: Two times a day (BID) | ORAL | Status: DC
  Administered 2016-03-02 – 2016-03-04 (×5): 500 mg via ORAL
  Filled 2016-03-02 (×9): qty 1

## 2016-03-02 MED ORDER — BUTORPHANOL TARTRATE 1 MG/ML IJ SOLN
1.0000 mg | Freq: Once | INTRAMUSCULAR | Status: AC | PRN
  Administered 2016-03-02: 1 mg via INTRAVENOUS

## 2016-03-02 MED ORDER — MAGNESIUM SULFATE BOLUS VIA INFUSION
4.0000 g | Freq: Once | INTRAVENOUS | Status: AC
  Administered 2016-03-02: 4 g via INTRAVENOUS
  Filled 2016-03-02: qty 500

## 2016-03-02 MED ORDER — BUTORPHANOL TARTRATE 1 MG/ML IJ SOLN
2.0000 mg | Freq: Once | INTRAMUSCULAR | Status: DC | PRN
  Filled 2016-03-02: qty 2

## 2016-03-02 MED ORDER — TERBUTALINE SULFATE 1 MG/ML IJ SOLN: 0.2500 mg | Freq: Once | INTRAMUSCULAR | Status: DC | PRN

## 2016-03-02 MED ORDER — ZOLPIDEM TARTRATE 5 MG PO TABS: 5.0000 mg | ORAL_TABLET | Freq: Every evening | ORAL | Status: DC | PRN

## 2016-03-02 MED ORDER — DOCUSATE SODIUM 100 MG PO CAPS
100.0000 mg | ORAL_CAPSULE | Freq: Every day | ORAL | Status: DC
  Administered 2016-03-02 – 2016-03-04 (×3): 100 mg via ORAL
  Filled 2016-03-02 (×4): qty 1

## 2016-03-02 MED ORDER — MAGNESIUM SULFATE 50 % IJ SOLN
3.0000 g/h | INTRAVENOUS | Status: DC
  Administered 2016-03-02 – 2016-03-03 (×2): 2 g/h via INTRAVENOUS
  Filled 2016-03-02 (×4): qty 80

## 2016-03-02 MED ORDER — ACETAMINOPHEN 325 MG PO TABS
650.0000 mg | ORAL_TABLET | ORAL | Status: DC | PRN
  Administered 2016-03-03 – 2016-03-04 (×3): 650 mg via ORAL
  Filled 2016-03-02 (×4): qty 2

## 2016-03-02 MED ORDER — PRENATAL MULTIVITAMIN CH
1.0000 | ORAL_TABLET | Freq: Every day | ORAL | Status: DC
  Administered 2016-03-02 – 2016-03-04 (×3): 1 via ORAL
  Filled 2016-03-02 (×4): qty 1

## 2016-03-02 MED ORDER — ONDANSETRON HCL 4 MG/2ML IJ SOLN
4.0000 mg | Freq: Four times a day (QID) | INTRAMUSCULAR | Status: DC | PRN
  Administered 2016-03-02 – 2016-03-04 (×2): 4 mg via INTRAVENOUS
  Filled 2016-03-02 (×2): qty 2

## 2016-03-02 MED ORDER — OXYCODONE-ACETAMINOPHEN 5-325 MG PO TABS
1.0000 | ORAL_TABLET | Freq: Four times a day (QID) | ORAL | Status: DC | PRN
  Administered 2016-03-02 (×2): 1 via ORAL
  Administered 2016-03-03: 2 via ORAL
  Administered 2016-03-03 (×2): 1 via ORAL
  Filled 2016-03-02: qty 1
  Filled 2016-03-02: qty 2
  Filled 2016-03-02 (×3): qty 1

## 2016-03-02 NOTE — MAU Note (Signed)
Pt c/o contractions that started earlier today-was started on Procardia q6h-last dose at 2am, which was 3rd dose. Also took tylenol for pain. States that contractions were every 2 mins since 2am and lasting 40-50 seconds. Denies LOF. Having some bloody mucous, which she has had. +FM. Cervix was 1.5/50.

## 2016-03-02 NOTE — H&P (Signed)
Joy Rivera is a 26 y.o. [id number] at 667w6d admitted for PTL.   0 Fetal presentation is cephalic.  History of Present Illness:  Seen in MAU twice over the past 2 days for contractions.  Her cx had remained unchanged at 1.5/50.  She was started on Procardia 10mg  q 6 hours.  Last dose 0200, but ctx have increased in strength and frequency, q 3-5 minutes.    Patient reports the fetal movement as active.  Patient reports  vaginal bleeding as spotting w/mucus. Patient describes fluid per vagina as None.  Patient Active Problem List   Diagnosis Date Noted  . Preterm labor 03/02/2016  . Genital herpes 01/02/2016  . Genital warts complicating pregnancy, second trimester 01/02/2016  . Rubella non-immune status, antepartum 10/31/2015  . Supervision of other normal pregnancy, antepartum 10/27/2015   Past Medical History: Past Medical History:  Diagnosis Date  . Abnormal Pap smear   . Anxiety   . Depression     Past Surgical History: Past Surgical History:  Procedure Laterality Date  . NO PAST SURGERIES      Obstetrical History: OB History    Gravida Para Term Preterm AB Living   5 1 1  0 3 1   SAB TAB Ectopic Multiple Live Births   0 2 1 0 1     GYN HX:  Had one outbreak of HSV "a little lower than the wart" year ago, does not feel as if she is getting an outbreak now.    Social History: Social History   Social History  . Marital status: Single    Spouse name: N/A  . Number of children: N/A  . Years of education: N/A   Social History Main Topics  . Smoking status: Never Smoker  . Smokeless tobacco: Never Used  . Alcohol use No  . Drug use: No  . Sexual activity: Yes    Birth control/ protection: None   Other Topics Concern  . None   Social History Narrative  . None    Family History: Family History  Problem Relation Age of Onset  . Hypertension Father   . Heart disease Father   . Cancer Maternal Aunt   . Cancer Maternal Uncle   . Hypertension  Paternal Grandmother   . Cancer Maternal Grandmother   . Cancer Maternal Grandfather     Allergies: No Known Allergies  Prescriptions Prior to Admission  Medication Sig Dispense Refill Last Dose  . acetaminophen (TYLENOL) 500 MG tablet Take 1,000 mg by mouth every 6 (six) hours as needed.   03/02/2016 at 0215  . Prenatal Multivit-Min-Fe-FA (PRENATAL VITAMINS) 0.8 MG tablet Take 1 tablet by mouth daily. 30 tablet 12 03/01/2016 at Unknown time  . NIFEdipine (PROCARDIA) 10 MG capsule Take 1 capsule (10 mg total) by mouth every 6 (six) hours as needed (contractions). 30 capsule 0 03/02/2016 at 0200    Review of Systems   Constitutional: Negative for fever and chills Eyes: Negative for visual disturbances Respiratory: Negative for shortness of breath, dyspnea Cardiovascular: Negative for chest pain or palpitations  Gastrointestinal: Negative for vomiting, diarrhea and constipation Genitourinary: Negative for dysuria and urgency Musculoskeletal: Negative for back pain, joint pain, myalgias  Neurological: Negative for dizziness and headaches    Vitals:  Blood pressure 116/66, pulse 113, resp. rate 20, height 5\' 3"  (1.6 m), weight 84.8 kg (187 lb), last menstrual period 07/16/2015, SpO2 99 %, unknown if currently breastfeeding. Physical Examination:  General appearance - alert, well appearing, and  in no distress Chest - normal resp effort Heart - normal rate and regular rhythm Abdomen - Gravid,ctx mild to palpation Pelvic Exam:perianal wart, no hsv lesions.  Some blood tinged mucous discharge Cervix: 3/60/-2/anterior  Extremities: no edema with DTRs 2+ Membranes:intact Fetal Monitoring:Cat 1   Labs:  Results for orders placed or performed during the hospital encounter of 03/01/16 (from the past 24 hour(s))  Urinalysis, Routine w reflex microscopic   Collection Time: 03/01/16  3:55 PM  Result Value Ref Range   Color, Urine YELLOW YELLOW   APPearance CLEAR CLEAR   Specific Gravity,  Urine 1.006 1.005 - 1.030   pH 7.0 5.0 - 8.0   Glucose, UA NEGATIVE NEGATIVE mg/dL   Hgb urine dipstick MODERATE (A) NEGATIVE   Bilirubin Urine NEGATIVE NEGATIVE   Ketones, ur NEGATIVE NEGATIVE mg/dL   Protein, ur NEGATIVE NEGATIVE mg/dL   Nitrite NEGATIVE NEGATIVE   Leukocytes, UA TRACE (A) NEGATIVE   RBC / HPF 0-5 0 - 5 RBC/hpf   WBC, UA 0-5 0 - 5 WBC/hpf   Bacteria, UA RARE (A) NONE SEEN   Squamous Epithelial / LPF 0-5 (A) NONE SEEN    Imaging Studies: [place] Mfm Fetal Bpp Wo Non Stress  Result Date: 03/01/2016 ----------------------------------------------------------------------  OBSTETRICS REPORT                      (Signed Final 03/01/2016 09:17 am) ---------------------------------------------------------------------- Patient Info  ID #:       [id number]                         D.O.B.:   04-05-1990 (25 yrs)  Name:       Elmarie Shiley A Heard              Visit Date:  02/29/2016 03:56 pm ---------------------------------------------------------------------- Performed By  Performed By:     Jodean Lima          Secondary Phy.:   RACHELLE A                    RDMS                                                             [facility] CNM  Attending:        Particia Nearing MD       Address:          [address]                                                             [place]  [address]  Referred By:      MAU Nursing-           Location:         [facility]                    MAU/Triage ---------------------------------------------------------------------- Orders   #  Description                                 Code   1  [place] MFM FETAL BPP WO NON STRESS              76819.01  ----------------------------------------------------------------------   #  Ordered By               Order #        Accession #    Episode #   1  LISA LEFTWICH-           [id number]       [id number]     [id number]      KIRBY  ---------------------------------------------------------------------- Indications   [redacted] weeks gestation of pregnancy                Z3A.32   Decreased fetal movement                       O36.8190   Vaginal bleeding in pregnancy, third trimester O46.93   Preterm contractions                           O47.00  ---------------------------------------------------------------------- OB History  Gravidity:    5         Term:   1        Prem:   0        SAB:   0  TOP:          2       Ectopic:  1        Living: 1 ---------------------------------------------------------------------- Fetal Evaluation  Num Of Fetuses:     1  Fetal Heart         152  Rate(bpm):  Cardiac Activity:   Observed  Presentation:       Cephalic  Placenta:           Anterior, above cervical os  P. Cord Insertion:  Visualized, central  Amniotic Fluid  AFI FV:      Mild polyhydramnios                              Largest Pocket(cm)                              10.17 ---------------------------------------------------------------------- Biophysical Evaluation  Amniotic F.V:   Polyhydramnios             F. Tone:        Observed  F. Movement:    Observed                   Score:          8/8  F. Breathing:   Observed ---------------------------------------------------------------------- Gestational Age  LMP:           32w 4d       Date:   07/16/15  EDD:   04/21/16  Best:          Armida Sans 4d    Det. By:   LMP  (07/16/15)          EDD:   04/21/16 ---------------------------------------------------------------------- Anatomy  Stomach:               Enlarged               Abdomen:                Dilated bowel ---------------------------------------------------------------------- Cervix Uterus Adnexa  Cervix  Not adaquately visualized  Uterus  No abnormality visualized.  Left Ovary  Within normal limits.  Right Ovary  Within normal limits.  Adnexa:       No abnormality visualized.  ---------------------------------------------------------------------- Impression  SIUP at 32+4 weeks  Cephalic presentation  Enlarged stomach; loops of dilated bowel; suspect small  bowel obstruction  Mild polyhydramnios  BPP 8/8  The US findings were shared with Ms. Ehmke. ---------------------------------------------------------------------- Recommendations  Follow-up ultrasound on 01/31 to reassess fetal anatomy ----------------------------------------------------------------------                 Particia Nearing, MD Electronically Signed Final Report   03/01/2016 09:17 am ----------------------------------------------------------------------   . betamethasone acetate-betamethasone sodium phosphate  12 mg Intramuscular Q24H  . docusate sodium  100 mg Oral Daily  . magnesium  4 g Intravenous Once  . prenatal multivitamin  1 tablet Oral Q1200   I have reviewed the patient's current medications.  Will start HSV prophylaxis   ASSESSMENT: Patient Active Problem List   Diagnosis Date Noted  . Preterm labor 03/02/2016  . Genital herpes 01/02/2016  . Genital warts complicating pregnancy, second trimester 01/02/2016  . Rubella non-immune status, antepartum 10/31/2015  . Supervision of other normal pregnancy, antepartum 10/27/2015   PLAN:  Discussed w/Dr. Rande Lawman.  Admit for BMZ, tocolysis.  All cultures collected over the past 2 visits, so far normal.

## 2016-03-02 NOTE — Progress Notes (Signed)
Still contracting around q 3 minutes.  Cx now feels a little more diltate:  3.5-4/80/-2/anterior.  FHR Cat 1.  Will give SQ terb

## 2016-03-02 NOTE — MAU Note (Signed)
Pt states she was here earlier and was started on procardia for contractions and they have become closer and stronger.

## 2016-03-02 NOTE — Progress Notes (Signed)
Faculty Practice OB/GYN Attending Note   Subjective:  Called to talk to patient about plan of care. She is frustrated that no one "has explained what is going on and I keep getting conflicting information".  Has rare contractions with some bloody show.  FHR reassuring, no contractions. Good FM.   Admitted on 03/02/2016 for Preterm labor.    Objective:  Blood pressure 117/75, pulse (!) 113, temperature 97.9 F (36.6 C), temperature source Oral, resp. rate 18, height 5\' 3"  (1.6 m), weight 187 lb (84.8 kg), last menstrual period 07/16/2015, SpO2 100 %, unknown if currently breastfeeding. FHT  Baseline 125 bpm, moderate variability, +accelerations, no decelerations Toco: q 10-12 mins Gen: NAD Abdomen: NT gravid fundus, soft Cervix: 4/70/-3, vertex +Bloody show Ext: 2+ DTRs, no edema, no cyanosis, negative Homan's sign  Assessment & Plan:  26 y.o. [id number] at 2464w6d admitted for preterm labor.  Contractions have spaced out on magnesium sulfate. Due to receive 2nd dose of betamethasone regimen this morning.  Written for Percocet for pain.  I apologized to patient about her feeling that the plan of care was not discussed with her; discussed that we are trying to stop her PTL and prepare her fetus in the event that PTL progresses. She understands this plan.  Neonatology consulted, especially given suspected small bowel obstruction seen on scan on 02/29/16.  Reassuring fetal status for now.  Will continue routine care.    Jaynie CollinsUGONNA  Humzah Harty, MD, FACOG Attending Obstetrician & Gynecologist Faculty Practice, [facility]

## 2016-03-03 MED ORDER — FENTANYL CITRATE (PF) 100 MCG/2ML IJ SOLN
50.0000 ug | INTRAMUSCULAR | Status: DC | PRN
  Administered 2016-03-03: 100 ug via INTRAVENOUS
  Filled 2016-03-03: qty 2

## 2016-03-03 MED ORDER — LACTATED RINGERS IV BOLUS (SEPSIS)
500.0000 mL | Freq: Once | INTRAVENOUS | Status: AC
  Administered 2016-03-03: 500 mL via INTRAVENOUS

## 2016-03-03 NOTE — Progress Notes (Signed)
FACULTY PRACTICE ANTEPARTUM COMPREHENSIVE PROGRESS NOTE  Joy Rivera is a 26 y.o. [phone] at 3562w0d who is admitted for Preterm labor.  Estimated Date of Delivery: 04/21/16 Fetal presentation is cephalic.  Length of Stay:  1 Days. Admitted 03/02/2016  Subjective: Patient had painful contractions overnight, pain not alleviated by Percocet, +Pressure. Some blood show. Patient reports good fetal movement.  No loss of fluid per vagina.  Vitals:  Blood pressure 119/73, pulse 86, temperature 97.7 F (36.5 C), temperature source Oral, resp. rate 18, height 5\' 3"  (1.6 m), weight 187 lb (84.8 kg), last menstrual period 07/16/2015, SpO2 98 %, unknown if currently breastfeeding. Physical Examination: CONSTITUTIONAL: Well-developed, well-nourished female in no acute distress.  HENT:  Normocephalic, atraumatic, External right and left ear normal. Oropharynx is clear and moist EYES: Conjunctivae and EOM are normal. Pupils are equal, round, and reactive to light. No scleral icterus.  NECK: Normal range of motion, supple, no masses SKIN: Skin is warm and dry. No rash noted. Not diaphoretic. No erythema. No pallor. NEUROLGIC: Alert and oriented to person, place, and time. Normal reflexes, muscle tone coordination. No cranial nerve deficit noted. PSYCHIATRIC: Normal mood and affect. Normal behavior. Normal judgment and thought content. CARDIOVASCULAR: Normal heart rate noted, regular rhythm RESPIRATORY: Effort and breath sounds normal, no problems with respiration noted MUSCULOSKELETAL: Normal range of motion. No edema and no tenderness. 2+ distal pulses. ABDOMEN: Soft, nontender, nondistended, gravid. CERVIX: Dilation: 4 Effacement (%): 70 Cervical Position: Posterior Station: -2 Presentation: Vertex Exam by:: ANYAWU  (Unchanged from previous)  Fetal monitoring: FHR: 120 bpm, Variability: moderate, Accelerations: Present, Decelerations: Absent  Uterine activity: q 5-6 min contractions during  encounter  Results for orders placed or performed during the hospital encounter of 03/02/16 (from the past 48 hour(s))  Type and screen [facility]     Status: None   Collection Time: 03/02/16  4:16 AM  Result Value Ref Range   ABO/RH(D) A POS    Antibody Screen NEG    Sample Expiration 03/05/2016   CBC     Status: Abnormal   Collection Time: 03/02/16  4:16 AM  Result Value Ref Range   WBC 9.4 4.0 - 10.5 K/uL   RBC 4.09 3.87 - 5.11 MIL/uL   Hemoglobin 11.5 (L) 12.0 - 15.0 g/dL   HCT 14.733.8 (L) 82.936.0 - 56.246.0 %   MCV 82.6 78.0 - 100.0 fL   MCH 28.1 26.0 - 34.0 pg   MCHC 34.0 30.0 - 36.0 g/dL   RDW 13.012.7 86.511.5 - 78.415.5 %   Platelets 241 150 - 400 K/uL  Urine rapid drug screen (hosp performed)not at [facility]     Status: Abnormal   Collection Time: 03/02/16  5:30 AM  Result Value Ref Range   Opiates NONE DETECTED NONE DETECTED   Cocaine NONE DETECTED NONE DETECTED   Benzodiazepines NONE DETECTED NONE DETECTED   Amphetamines NONE DETECTED NONE DETECTED   Tetrahydrocannabinol POSITIVE (A) NONE DETECTED   Barbiturates NONE DETECTED NONE DETECTED    Comment:        DRUG SCREEN FOR MEDICAL PURPOSES ONLY.  IF CONFIRMATION IS NEEDED FOR ANY PURPOSE, NOTIFY LAB WITHIN 5 DAYS.        LOWEST DETECTABLE LIMITS FOR URINE DRUG SCREEN Drug Class       Cutoff (ng/mL) Amphetamine      1000 Barbiturate      200 Benzodiazepine   200 Tricyclics       300 Opiates  300 Cocaine          300 THC              50     No results found.  Current scheduled medications . docusate sodium  100 mg Oral Daily  . prenatal multivitamin  1 tablet Oral Q1200  . valACYclovir  500 mg Oral BID    I have reviewed the patient's current medications.  ASSESSMENT: Principal Problem:   Preterm labor Active Problems:   Suspected fetal anomaly, antepartum   PLAN: 1) PTL: Continue magnesium sulfate for now, received 2nd dose of betamethasone this morning.  Continue close monitoring for  now. Reassuring fetal status. 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 Neonatolgy consult rdered; talked to Dr. Glennie Isle yesterday.   Continue routine antenatal care.   Jaynie Collins, MD, FACOG Attending Obstetrician & Gynecologist Faculty Practice, [facility]

## 2016-03-03 NOTE — Consult Note (Signed)
[facility]  Prenatal Consult       03/03/2016  9:51 AM   I was asked by Dr. Macon LargeAnyanwu to consult on this patient for possible preterm delivery.  I had the pleasure of meeting with Ms. Joy HarnessSimmons and the father of the baby today.  She is a 26 y/o G5P1031 at 2933 and 0/[redacted] weeks gestation.  She was admitted for preterm labor and received BMZ 1/26 and 1/27.  An ultrasound on 1/24 was notable for dilated bowel loops and was concerning for an intestinal atresia.  She was started on magnesium and her labor does not appear to be progressing at this time.    I explained that should she deliver prematurely the neonatal intensive care team would be present for the delivery and outlined the likely delivery room course for this baby. We discussed common problems associated with prematurity including respiratory distress syndrome, feeding issues, temperature regulation, and infection risk.  We discussed the average length of stay but I noted that the actual LOS would depend on the severity of problems encountered and response to treatments.  We discussed the possible intestinal atresia, the limitations of prenatal ultrasound, the various diagnostic approaches and treatments, and the large range of severity that can be encountered when dealing with an atresia or other cause of bowel obstruction.  They are aware that the infant may require [facility] if an atresia or obstruction is diagnosed or strongly suspected.  We discussed visitation policies and the resources available while their child is in the hospital.  They [facility], but are undecided at this point.  Thank you for involving us in the care of this patient. A member of our team will be available should the family have additional questions.  Time for consultation approximately 45 minutes.   _____________________ Electronically Signed By: Maryan CharLindsey Abagayle Klutts, MD Neonatologist

## 2016-03-04 ENCOUNTER — Inpatient Hospital Stay (HOSPITAL_COMMUNITY): Payer: Medicaid Other | Admitting: Anesthesiology

## 2016-03-04 ENCOUNTER — Encounter (HOSPITAL_COMMUNITY): Payer: Self-pay | Admitting: *Deleted

## 2016-03-04 DIAGNOSIS — Z3A32 32 weeks gestation of pregnancy: Secondary | ICD-10-CM

## 2016-03-04 LAB — OB RESULTS CONSOLE GBS: STREP GROUP B AG: POSITIVE

## 2016-03-04 LAB — CULTURE, BETA STREP (GROUP B ONLY)

## 2016-03-04 MED ORDER — PHENYLEPHRINE 40 MCG/ML (10ML) SYRINGE FOR IV PUSH (FOR BLOOD PRESSURE SUPPORT)
80.0000 ug | PREFILLED_SYRINGE | INTRAVENOUS | Status: DC | PRN
  Filled 2016-03-04: qty 5
  Filled 2016-03-04: qty 10

## 2016-03-04 MED ORDER — DIBUCAINE 1 % RE OINT: 1.0000 "application " | TOPICAL_OINTMENT | RECTAL | Status: DC | PRN

## 2016-03-04 MED ORDER — PENICILLIN G POT IN DEXTROSE 60000 UNIT/ML IV SOLN
3.0000 10*6.[IU] | INTRAVENOUS | Status: DC
  Administered 2016-03-04: 3 10*6.[IU] via INTRAVENOUS
  Filled 2016-03-04 (×2): qty 50

## 2016-03-04 MED ORDER — MEASLES, MUMPS & RUBELLA VAC ~~LOC~~ INJ
0.5000 mL | INJECTION | Freq: Once | SUBCUTANEOUS | Status: AC
  Administered 2016-03-05: 0.5 mL via SUBCUTANEOUS
  Filled 2016-03-04: qty 0.5

## 2016-03-04 MED ORDER — ACETAMINOPHEN 500 MG PO TABS: 1000.0000 mg | ORAL_TABLET | Freq: Four times a day (QID) | ORAL | Status: DC | PRN

## 2016-03-04 MED ORDER — COCONUT OIL OIL: 1.0000 "application " | TOPICAL_OIL | Status: DC | PRN

## 2016-03-04 MED ORDER — EPHEDRINE 5 MG/ML INJ
10.0000 mg | INTRAVENOUS | Status: DC | PRN
  Filled 2016-03-04: qty 4

## 2016-03-04 MED ORDER — DEXTROSE 5 % IV SOLN
5.0000 10*6.[IU] | Freq: Once | INTRAVENOUS | Status: AC
  Administered 2016-03-04: 5 10*6.[IU] via INTRAVENOUS
  Filled 2016-03-04: qty 5

## 2016-03-04 MED ORDER — LIDOCAINE HCL (PF) 1 % IJ SOLN
INTRAMUSCULAR | Status: DC | PRN
  Administered 2016-03-04: 6 mL via EPIDURAL
  Administered 2016-03-04: 4 mL

## 2016-03-04 MED ORDER — LACTATED RINGERS IV SOLN: INTRAVENOUS | Status: DC

## 2016-03-04 MED ORDER — DIPHENHYDRAMINE HCL 25 MG PO CAPS
25.0000 mg | ORAL_CAPSULE | Freq: Four times a day (QID) | ORAL | Status: DC | PRN
  Filled 2016-03-04: qty 1

## 2016-03-04 MED ORDER — FENTANYL 2.5 MCG/ML BUPIVACAINE 1/10 % EPIDURAL INFUSION (WH - ANES)
14.0000 mL/h | INTRAMUSCULAR | Status: DC | PRN
  Administered 2016-03-04 (×2): 14 mL/h via EPIDURAL
  Filled 2016-03-04 (×2): qty 100

## 2016-03-04 MED ORDER — CYCLOBENZAPRINE HCL 10 MG PO TABS
10.0000 mg | ORAL_TABLET | Freq: Three times a day (TID) | ORAL | Status: DC | PRN
  Filled 2016-03-04: qty 1

## 2016-03-04 MED ORDER — ZOLPIDEM TARTRATE 5 MG PO TABS: 5.0000 mg | ORAL_TABLET | Freq: Every evening | ORAL | Status: DC | PRN

## 2016-03-04 MED ORDER — BENZOCAINE-MENTHOL 20-0.5 % EX AERO: 1.0000 "application " | INHALATION_SPRAY | CUTANEOUS | Status: DC | PRN

## 2016-03-04 MED ORDER — LIDOCAINE HCL (PF) 1 % IJ SOLN
INTRAMUSCULAR | Status: AC
  Filled 2016-03-04: qty 30

## 2016-03-04 MED ORDER — DIPHENHYDRAMINE HCL 50 MG/ML IJ SOLN
12.5000 mg | INTRAMUSCULAR | Status: DC | PRN
  Administered 2016-03-04 (×2): 12.5 mg via INTRAVENOUS
  Filled 2016-03-04: qty 1

## 2016-03-04 MED ORDER — OXYCODONE-ACETAMINOPHEN 5-325 MG PO TABS
1.0000 | ORAL_TABLET | Freq: Four times a day (QID) | ORAL | Status: DC | PRN
  Administered 2016-03-04 – 2016-03-05 (×4): 2 via ORAL
  Filled 2016-03-04 (×4): qty 2

## 2016-03-04 MED ORDER — LACTATED RINGERS IV BOLUS (SEPSIS)
500.0000 mL | Freq: Once | INTRAVENOUS | Status: AC
  Administered 2016-03-04: 500 mL via INTRAVENOUS

## 2016-03-04 MED ORDER — LACTATED RINGERS IV SOLN
500.0000 mL | Freq: Once | INTRAVENOUS | Status: AC
  Administered 2016-03-04: 500 mL via INTRAVENOUS

## 2016-03-04 MED ORDER — PHENYLEPHRINE 40 MCG/ML (10ML) SYRINGE FOR IV PUSH (FOR BLOOD PRESSURE SUPPORT)
80.0000 ug | PREFILLED_SYRINGE | INTRAVENOUS | Status: DC | PRN
  Filled 2016-03-04: qty 5

## 2016-03-04 MED ORDER — PRENATAL MULTIVITAMIN CH: 1.0000 | ORAL_TABLET | Freq: Every day | ORAL | Status: DC

## 2016-03-04 MED ORDER — PENICILLIN G POTASSIUM 5000000 UNITS IJ SOLR: 5.0000 10*6.[IU] | Freq: Once | INTRAMUSCULAR | Status: DC

## 2016-03-04 MED ORDER — WITCH HAZEL-GLYCERIN EX PADS: 1.0000 "application " | MEDICATED_PAD | CUTANEOUS | Status: DC | PRN

## 2016-03-04 MED ORDER — AZITHROMYCIN 250 MG PO TABS
1000.0000 mg | ORAL_TABLET | Freq: Once | ORAL | Status: AC
  Administered 2016-03-04: 1000 mg via ORAL
  Filled 2016-03-04: qty 4

## 2016-03-04 MED ORDER — TETANUS-DIPHTH-ACELL PERTUSSIS 5-2.5-18.5 LF-MCG/0.5 IM SUSP
0.5000 mL | Freq: Once | INTRAMUSCULAR | Status: AC
  Administered 2016-03-05: 0.5 mL via INTRAMUSCULAR
  Filled 2016-03-04: qty 0.5

## 2016-03-04 MED ORDER — IBUPROFEN 600 MG PO TABS
600.0000 mg | ORAL_TABLET | Freq: Four times a day (QID) | ORAL | Status: DC
  Administered 2016-03-04 – 2016-03-05 (×3): 600 mg via ORAL
  Filled 2016-03-04 (×3): qty 1

## 2016-03-04 MED ORDER — SIMETHICONE 80 MG PO CHEW: 80.0000 mg | CHEWABLE_TABLET | ORAL | Status: DC | PRN

## 2016-03-04 MED ORDER — ONDANSETRON HCL 4 MG/2ML IJ SOLN
4.0000 mg | INTRAMUSCULAR | Status: DC | PRN
Start: 2016-03-04 — End: 2016-03-05

## 2016-03-04 MED ORDER — FAMOTIDINE 20 MG PO TABS
20.0000 mg | ORAL_TABLET | Freq: Two times a day (BID) | ORAL | Status: DC
  Administered 2016-03-04: 20 mg via ORAL
  Filled 2016-03-04: qty 1

## 2016-03-04 MED ORDER — OXYTOCIN 40 UNITS IN LACTATED RINGERS INFUSION - SIMPLE MED
INTRAVENOUS | Status: AC
  Filled 2016-03-04: qty 1000

## 2016-03-04 MED ORDER — DOCUSATE SODIUM 100 MG PO CAPS
100.0000 mg | ORAL_CAPSULE | Freq: Two times a day (BID) | ORAL | Status: DC
  Administered 2016-03-04 – 2016-03-05 (×2): 100 mg via ORAL
  Filled 2016-03-04 (×2): qty 1

## 2016-03-04 MED ORDER — SENNOSIDES-DOCUSATE SODIUM 8.6-50 MG PO TABS: 2.0000 | ORAL_TABLET | ORAL | Status: DC

## 2016-03-04 MED ORDER — AMOXICILLIN 500 MG PO CAPS
500.0000 mg | ORAL_CAPSULE | Freq: Three times a day (TID) | ORAL | Status: DC
Start: 2016-03-06 — End: 2016-03-04

## 2016-03-04 MED ORDER — SODIUM CHLORIDE 0.9 % IV SOLN
2.0000 g | Freq: Four times a day (QID) | INTRAVENOUS | Status: DC
  Administered 2016-03-04: 2 g via INTRAVENOUS
  Filled 2016-03-04 (×3): qty 2000

## 2016-03-04 MED ORDER — ONDANSETRON HCL 4 MG PO TABS: 4.0000 mg | ORAL_TABLET | ORAL | Status: DC | PRN

## 2016-03-04 NOTE — Progress Notes (Signed)
   Subjective: Pt reports increased comfort after epidural.  Objective: BP 117/61   Pulse 74   Temp 97.8 F (36.6 C) (Oral)   Resp 18   Ht 5\' 3"  (1.6 m)   Wt 84.8 kg (187 lb)   LMP 07/16/2015   SpO2 97%   BMI 33.13 kg/m  I/O last 3 completed shifts: In: 7635 [P.O.:5060; I.V.:2575] Out: 9325 [Urine:9325] Total I/O In: 2185.6 [P.O.:580; I.V.:605.6; IV Piggyback:1000] Out: 1450 [Urine:1450]  FHT:  FHR: 131 bpm, variability: moderate,  accelerations:  Present,  decelerations:  Absent; 10x10 UC:   irritability SVE:   Dilation: 4 Effacement (%): 70 Station: -2 Exam by:: ANYAWU Vaginal exam:  Genital wart  Labs: Lab Results  Component Value Date   WBC 9.4 03/02/2016   HGB 11.5 (L) 03/02/2016   HCT 33.8 (L) 03/02/2016   MCV 82.6 03/02/2016   PLT 241 03/02/2016    Assessment / Plan: 26 y.o. G5P1031 at 1734w1d IUP Preterm Rupture of Membranes Meconium-stained fluid  Labor: Observation Preeclampsia:  n/a Fetal Wellbeing:  Category I Pain Control:  Epidural Anticipated MOD:  NSVD  Rochele PagesWalidah Karim 03/04/2016, 6:26 AM

## 2016-03-04 NOTE — Progress Notes (Signed)
Rutherford Nailiffany A Chasse is a 26 y.o. [phone] at 3728w1d by  admitted for rupture of membranes, Preterm labor  Subjective:   Objective: BP 107/65   Pulse 86   Temp 98.8 F (37.1 C) (Axillary)   Resp 18   Ht 5\' 3"  (1.6 m)   Wt 187 lb (84.8 kg)   LMP 07/16/2015   SpO2 99%   BMI 33.13 kg/m  I/O last 3 completed shifts: In: 7540.6 [P.O.:4260; I.V.:2280.6; IV Piggyback:1000] Out: 8250 [Urine:8250] No intake/output data recorded.  FHT:  FHR: 120 bpm, variability: moderate,  accelerations:  Abscent,  decelerations:  Absent UC:   regular, every 5 minutes SVE:   Dilation: 4 Effacement (%): 70 Station: -2 Exam by:: ANYAWU  Labs: Lab Results  Component Value Date   WBC 9.4 03/02/2016   HGB 11.5 (L) 03/02/2016   HCT 33.8 (L) 03/02/2016   MCV 82.6 03/02/2016   PLT 241 03/02/2016    Assessment / Plan:  and PTL  Labor: Progressing normally Preeclampsia:  no signs or symptoms of toxicity Fetal Wellbeing:  Category I Pain Control:  Epidural I/D:  n/a Anticipated MOD:  NSVD  Wyvonnia DuskyMarie Vontae Court 03/04/2016, 9:22 AM

## 2016-03-04 NOTE — Anesthesia Procedure Notes (Signed)
Epidural Patient location during procedure: OB  Staffing Anesthesiologist: Icker Swigert  Preanesthetic Checklist Completed: patient identified, site marked, surgical consent, pre-op evaluation, timeout performed, IV checked, risks and benefits discussed and monitors and equipment checked  Epidural Patient position: sitting Prep: site prepped and draped and DuraPrep Patient monitoring: continuous pulse ox and blood pressure Approach: midline Location: L3-L4 Injection technique: LOR air  Needle:  Needle type: Tuohy  Needle gauge: 17 G Needle length: 9 cm and 9 Needle insertion depth: 5 cm cm Catheter type: closed end flexible Catheter size: 19 Gauge Catheter at skin depth: 10 cm Test dose: negative  Assessment Events: blood not aspirated, injection not painful, no injection resistance, negative IV test and no paresthesia  Additional Notes Dosing of Epidural:  1st dose, through catheter .............................................  Xylocaine 40 mg  2nd dose, through catheter, after waiting 3 minutes.........Xylocaine 60 mg    As each dose occurred, patient was free of IV sx; and patient exhibited no evidence of SA injection.  Patient is more comfortable after epidural dosed. Please see RN's note for documentation of vital signs,and FHR which are stable.  Patient reminded not to try to ambulate with numb legs, and that an RN must be present when she attempts to get up.        

## 2016-03-04 NOTE — Progress Notes (Addendum)
Mother visiting baby in NICU from [phone], when [facility]. [facility], Arta SilenceKatri at [phone]. Mother is stable.

## 2016-03-04 NOTE — Progress Notes (Signed)
Faculty Practice OB/GYN Attending Note   26 y.o. [phone] at 9360w1d admitted for PTL now with PPROM; fetus with suspected intestinal obstruction.  Patient currently has an epidural, no pelvic pressure currently.    Patient had questions about what would happen if baby is born today. Explained that the NICU team is ready to support the infant, and will do preliminary studies needed for surgery.  Once surgery is scheduled, [facility]. Discussed transportation process in details, patient wanted to be be assured that the baby will not be "exposed to the outside".  Explained the isolette transportation process, she was reassured.  Answered several other questions to the best of my ability; deferred many regarding surgical details to Pediatric Surgery surgeon who will meet with her and allay her concerns when surgery is scheduled.  For now, [facility].  Latency antibiotics ordered.  Will keep epidural in place for now. Reassuring FHR tracing, contractions q3-5 minutes. Patient told that cervical checks will be done if indicated; will avoid doing too many checks to reduce risk of infection.  Will continue close observation.   Jaynie CollinsUGONNA  Ozzie Remmers, MD, FACOG Attending Obstetrician & Gynecologist Faculty Practice, [facility]

## 2016-03-04 NOTE — Progress Notes (Signed)
Patient talking on cell phone upon initial introduction of self to patient. Twenty minutes later, patient ended phone call to discuss plan of care. Patient had several questions about her plan of care and wants to speak to her physician. Patient states [facility] is stable enough to do so. I notified CNM and she states that the MD will come speak with her around 9am. I relayed this information to the patient.

## 2016-03-04 NOTE — Anesthesia Preprocedure Evaluation (Signed)

## 2016-03-04 NOTE — Progress Notes (Signed)
Joy Rivera is a 26 y.o. [phone] at 1751w1d by ultrasound admitted for rupture of membranes  Subjective: Feeling pelvic pressure  Objective: BP (!) 119/57   Pulse 87   Temp 98.9 F (37.2 C) (Axillary)   Resp 20   Ht 5\' 3"  (1.6 m)   Wt 187 lb (84.8 kg)   LMP 07/16/2015   SpO2 99%   BMI 33.13 kg/m  I/O last 3 completed shifts: In: 7540.6 [P.O.:4260; I.V.:2280.6; IV Piggyback:1000] Out: 8250 [Urine:8250] No intake/output data recorded.  FHT:  FHR: 130 bpm, variability: moderate,  accelerations:  Present,  decelerations:  Absent UC:   q2-4 mins SVE:   Dilation: 8 Effacement (%): 90 Station: 0, +1 Exam by:: Henderson NewcomerStephanie Faulk, RN  Labs: Lab Results  Component Value Date   WBC 9.4 03/02/2016   HGB 11.5 (L) 03/02/2016   HCT 33.8 (L) 03/02/2016   MCV 82.6 03/02/2016   PLT 241 03/02/2016    Assessment / Plan: PPROM, in active labor  Labor: Expectant management for now Fetal Wellbeing:  Category I. 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 Pain Control:  Epidural I/D:  n/a Anticipated MOD:  NSVD. Neonatology aware.   Kadin Bera 03/04/2016, 3:03 PM

## 2016-03-04 NOTE — Progress Notes (Signed)
Called to evaluate patient with persistent contractions. Patient reports a decrease in the frequency of her contractions since increasing the magnesium sulfate to 3 gm an hour, however, the intensity has not changed. Patient reports good fetal movement and denies rupture of membrane  Blood pressure (!) 98/58, pulse 90, temperature 97.7 F (36.5 C), temperature source Oral, resp. rate 19, height 5\' 3"  (1.6 m), weight 187 lb (84.8 kg), last menstrual period 07/16/2015, SpO2 100 %, unknown if currently breastfeeding. GENERAL: Well-developed, well-nourished female in no acute distress.  ABDOMEN: Soft, nontender, gravid PELVIC: 4.5/70/-2  SROM- with blood tinged fluid EXTREMITIES: No cyanosis, clubbing, or edema, 2+ distal pulses.  A/P 26 yo G5P1031 at 6453w1d with SROM in PTL, s/p BMZ - Patient to be transferred to L&D - Will start GBS prophylaxis as culture result is still pending - pain management as needed

## 2016-03-04 NOTE — Progress Notes (Signed)
Joy Rivera is a 26 y.o. [phone] at 9362w1d by ultrasound admitted for rupture of membranes  Subjective:   Objective: BP 121/60   Pulse 76   Temp 99.1 F (37.3 C) (Axillary)   Resp 18   Ht 5\' 3"  (1.6 m)   Wt 187 lb (84.8 kg)   LMP 07/16/2015   SpO2 99%   BMI 33.13 kg/m  I/O last 3 completed shifts: In: 7540.6 [P.O.:4260; I.V.:2280.6; IV Piggyback:1000] Out: 8250 [Urine:8250] No intake/output data recorded.  FHT:  FHR: 130 bpm, variability: moderate,  accelerations:  Present,  decelerations:  Absent UC:   none SVE:   Dilation: 4 Effacement (%): 70 Station: -2 Exam by:: ANYAWU  Labs: Lab Results  Component Value Date   WBC 9.4 03/02/2016   HGB 11.5 (L) 03/02/2016   HCT 33.8 (L) 03/02/2016   MCV 82.6 03/02/2016   PLT 241 03/02/2016    Assessment / Plan: PPROM yet to be in labor  Labor: yet to be in labor Preeclampsia:  no signs or symptoms of toxicity Fetal Wellbeing:  Category I Pain Control:  Epidural I/D:  n/a Anticipated MOD:  NSVD  Joy Rivera 03/04/2016, 12:34 PM

## 2016-03-05 ENCOUNTER — Encounter: Payer: Self-pay | Admitting: Advanced Practice Midwife

## 2016-03-05 DIAGNOSIS — O99820 Streptococcus B carrier state complicating pregnancy: Secondary | ICD-10-CM | POA: Insufficient documentation

## 2016-03-05 MED ORDER — DOCUSATE SODIUM 100 MG PO CAPS: 100.0000 mg | ORAL_CAPSULE | Freq: Two times a day (BID) | ORAL | 0 refills | Status: DC | PRN

## 2016-03-05 MED ORDER — CYCLOBENZAPRINE HCL 10 MG PO TABS: 10.0000 mg | ORAL_TABLET | Freq: Three times a day (TID) | ORAL | 0 refills | Status: DC | PRN

## 2016-03-05 MED ORDER — IBUPROFEN 600 MG PO TABS: 600.0000 mg | ORAL_TABLET | Freq: Four times a day (QID) | ORAL | 1 refills | Status: DC

## 2016-03-05 MED ORDER — GUAIFENESIN 100 MG/5ML PO SOLN
5.0000 mL | ORAL | Status: DC | PRN
  Administered 2016-03-05: 100 mg via ORAL
  Filled 2016-03-05 (×2): qty 15

## 2016-03-05 MED ORDER — OXYCODONE-ACETAMINOPHEN 5-325 MG PO TABS: 1.0000 | ORAL_TABLET | Freq: Four times a day (QID) | ORAL | 0 refills | Status: DC | PRN

## 2016-03-05 MED ORDER — PSEUDOEPHEDRINE HCL 30 MG PO TABS
30.0000 mg | ORAL_TABLET | Freq: Four times a day (QID) | ORAL | Status: DC | PRN
  Administered 2016-03-05: 30 mg via ORAL
  Filled 2016-03-05: qty 1
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
## 2016-03-05 NOTE — Discharge Instructions (Signed)

## 2016-03-05 NOTE — Plan of Care (Signed)
Problem: Urinary Elimination: Goal: Ability to reestablish a normal urinary elimination pattern will improve Outcome: Completed/Met Date Met: 03/05/16 Has voided a few times qs amber urine since Foley was removed.

## 2016-03-05 NOTE — Discharge Summary (Signed)
OB Discharge Summary     Patient Name: Joy Rivera DOB: 12-Oct-1990 MRN: [id number]  Date of admission: 03/02/2016 Delivering MD: Zerita BoersLAWSON, DARLENE   Date of discharge: 03/05/2016  Admitting diagnosis: 32WKS CTX Intrauterine pregnancy: 710w1d     Secondary diagnosis:  Principal Problem:   Preterm labor Active Problems:   Suspected fetal anomaly, antepartum  Additional problems: Fetal intestinal atresia     Discharge diagnosis: Preterm Pregnancy Delivered                                                                                                Post partum procedures:None  Augmentation: None  Complications: Placental Abruption noted at delivery  Hospital course:  Onset of Labor With Vaginal Delivery     26 y.o. yo [id number] at 4367w6d was admitted with preterm labor on 03/02/2016.    She was admitted to L&D and started on betamethasone (2 doses, 24 hours apart), magnesium sulfate for tocolysis and antibiotics for GBS prophylaxis. Neonatology was consulted for prematurity and delivery plan given suspected fetal intestinal atresia.   Her cervical exam remained stable and she was moved to the Antenatal Unit on 03/03/16. She had a concerning painful contractions and initially had no cervical change, magnesium sulfate was increased and she was given analgesia. In the early morning on 03/04/16, she had PPROM.  She was moved back down to L&D and epidural was given for pain. Her cervix then started to dilate further through out the day, FHTremained reassuring. She then had a SVD of a viable female infant; delivery remarkable for placental abruption and triple nuchal cord. Please see delivery summary for further details.  Infant was taken to the NICU, and the patient was transferred to the postpartum unit.  Patient had an uncomplicated postpartum course.  By time of discharge on PPD#1, her pain was controlled on oral pain medications; she had appropriate lochia and was ambulating, voiding without  difficulty, tolerating regular diet and passing flatus.  She is breastfeeding and will use IUD/Nexplanon for postpartum contraception.  She was deemed stable for discharge to home.    Discharge Exam: Blood pressure 113/74, pulse 61, temperature 98.2 F (36.8 C), temperature source Oral, resp. rate 16, height 5\' 3"  (1.6 m), weight 187 lb (84.8 kg), last menstrual period 07/16/2015, SpO2 100 %, unknown if currently breastfeeding. General appearance: Alert and no distress Resp: Clear to auscultation bilaterally Cardio: Regular rate and rhythm Abdomen: Fundus below umbilicus, appropriate lochia. NT abdomen. Extremities: Extremities normal, atraumatic, no cyanosis or edema and Homans sign is negative, no sign of DVT Pulses: 2+ and symmetric Neurologic: Alert and oriented X 3, normal strength and tone. Normal symmetric reflexes. Normal coordination and gait  Lab Results  Component Value Date   WBC 9.4 03/02/2016   HGB 11.5 (L) 03/02/2016   HCT 33.8 (L) 03/02/2016   MCV 82.6 03/02/2016   PLT 241 03/02/2016   CMP Latest Ref Rng & Units 02/07/2016  Glucose 65 - 99 mg/dL 130(Q241(H)  BUN 6 - 20 mg/dL 5(L)  Creatinine 6.570.44 - 1.00 mg/dL 8.460.56  Sodium 962135 - 952145 mmol/L  133(L)  Potassium 3.5 - 5.1 mmol/L 3.1(L)  Chloride 101 - 111 mmol/L 103  CO2 22 - 32 mmol/L 24  Calcium 8.9 - 10.3 mg/dL 7.9(L)  Total Protein 6.5 - 8.1 g/dL 1.6(X)  Total Bilirubin 0.3 - 1.2 mg/dL <0.9(U)  Alkaline Phos 38 - 126 U/L 52  AST 15 - 41 U/L 15  ALT 14 - 54 U/L 11(L)    Discharge instruction: per After Visit Summary and "Baby and Me Booklet".  After visit meds:  Allergies as of 03/05/2016   No Known Allergies     Medication List    STOP taking these medications   acetaminophen 500 MG tablet Commonly known as:  TYLENOL   NIFEdipine 10 MG capsule Commonly known as:  PROCARDIA     TAKE these medications   cyclobenzaprine 10 MG tablet Commonly known as:  FLEXERIL Take 1 tablet (10 mg total) by mouth 3 (three)  times daily as needed for muscle spasms.   docusate sodium 100 MG capsule Commonly known as:  COLACE Take 1 capsule (100 mg total) by mouth 2 (two) times daily as needed for mild constipation.   ibuprofen 600 MG tablet Commonly known as:  ADVIL,MOTRIN Take 1 tablet (600 mg total) by mouth every 6 (six) hours.   oxyCODONE-acetaminophen 5-325 MG tablet Commonly known as:  PERCOCET/ROXICET Take 1-2 tablets by mouth every 6 (six) hours as needed for severe pain.   Prenatal Vitamins 0.8 MG tablet Take 1 tablet by mouth daily.       Diet: routine diet  Activity: Advance as tolerated. Pelvic rest for 6 weeks.   Outpatient follow up:6 weeks Follow up Appt:Future Appointments Date Time Provider Department Center  03/07/2016 3:45 PM [facility] [place] 2 [facility]  03/14/2016 1:45 PM Rachelle A Denney, CNM [facility] None   Follow up Visit:No Follow-up on file.  Postpartum contraception: Nexplanon or IUD  Newborn Data: Live born female  Birth Weight: 4 lb 8.3 oz (2050 g) APGAR: 5, 8  Baby Feeding: Breast Disposition:NICU   03/05/2016 Jaynie Collins, MD

## 2016-03-05 NOTE — Anesthesia Postprocedure Evaluation (Addendum)
Anesthesia Post Note  Patient: Joy Rivera  Procedure(s) Performed: * No procedures listed *  Patient location during evaluation: Mother Baby Anesthesia Type: Epidural Level of consciousness: awake Pain management: satisfactory to patient Vital Signs Assessment: post-procedure vital signs reviewed and stable Respiratory status: spontaneous breathing Cardiovascular status: stable Anesthetic complications: no        Last Vitals:  Vitals:   03/05/16 0603 03/05/16 0747  BP: 108/83 113/74  Pulse: 66 61  Resp: 16 16  Temp: 36.9 C 36.8 C    Last Pain:  Vitals:   03/05/16 0802  TempSrc:   PainSc: 3    Pain Goal: Patients Stated Pain Goal: 3 (03/05/16 [id number])               Cephus ShellingBURGER,LINDA

## 2016-03-07 ENCOUNTER — Encounter (HOSPITAL_COMMUNITY): Payer: Self-pay

## 2016-03-07 ENCOUNTER — Ambulatory Visit (HOSPITAL_COMMUNITY)
Admit: 2016-03-07 | Discharge: 2016-03-07 | Disposition: A | Discharge status: Home / Self Care | Payer: Medicaid Other | Attending: Certified Nurse Midwife | Admitting: Certified Nurse Midwife

## 2016-03-12 NOTE — Progress Notes (Signed)
CSW made CPS report with [place] CPS worker, Pam Miller.  Infant's CDS was positive for THC, Oxycodone, and Noroxycodone.  CPS will follow-up with MOB and family.  Sujata Maines Boyd-Gilyard, MSW, LCSW Clinical Social Work [phone]  

## 2016-03-14 ENCOUNTER — Encounter: Payer: Medicaid Other | Admitting: Certified Nurse Midwife

## 2016-04-03 ENCOUNTER — Ambulatory Visit: Payer: Medicaid Other | Admitting: Certified Nurse Midwife

## 2016-04-30 ENCOUNTER — Ambulatory Visit (INDEPENDENT_AMBULATORY_CARE_PROVIDER_SITE_OTHER): Payer: Medicaid Other | Admitting: Certified Nurse Midwife

## 2016-04-30 ENCOUNTER — Encounter: Payer: Self-pay | Admitting: Certified Nurse Midwife

## 2016-04-30 DIAGNOSIS — F53 Postpartum depression: Secondary | ICD-10-CM

## 2016-04-30 DIAGNOSIS — Z1389 Encounter for screening for other disorder: Secondary | ICD-10-CM

## 2016-04-30 DIAGNOSIS — O99345 Other mental disorders complicating the puerperium: Secondary | ICD-10-CM

## 2016-04-30 NOTE — Progress Notes (Signed)
Post Partum Exam  Joy Nailiffany A Rivera is a 26 y.o. [phone] female who presents for a postpartum visit. She is 8 weeks postpartum following a spontaneous vaginal delivery. I have fully reviewed the prenatal and intrapartum course. The delivery was at 4439w1d gestational weeks.  Anesthesia: epidural. Postpartum course has been unremarkable. Baby's course has been fair. Premature baby. He had bowel obstruction surgeries. Baby is feeding by bottle - Enfacare. Bleeding no bleeding. Bowel function is normal. Bladder function is normal. Patient is sexually active. Contraception method is none. Pt wants Nexplanon; however, wishes to start BCP until device arrives. Postpartum depression screening: EPDS - 12. Normal pap 10/27/15  The following portions of the patient's history were reviewed and updated as appropriate: allergies, current medications, past family history, past medical history, past social history, past surgical history and problem list.  Review of Systems Pertinent items noted in HPI and remainder of comprehensive ROS otherwise negative.    Objective:  unknown if currently breastfeeding.  General:  alert, cooperative and no distress   Breasts:  inspection negative, no nipple discharge or bleeding, no masses or nodularity palpable  Lungs: clear to auscultation bilaterally  Heart:  regular rate and rhythm, S1, S2 normal, no murmur, click, rub or gallop  Abdomen: soft, non-tender; bowel sounds normal; no masses,  no organomegaly   Vulva:  normal  Vagina: normal vagina, no discharge, exudate, lesion, or erythema  Cervix:  no cervical motion tenderness  Corpus: normal size, contour, position, consistency, mobility, non-tender  Adnexa:  normal adnexa  Rectal Exam: Not performed.        Assessment:    Normal 8 week postpartum exam. Pap smear not done at today's visit.   Contraception management: Nexplanon ordered  Condoms given  Slightly depressed d/t circumstances: counseling  ordered  Encounter for routine postpartum follow-up  Postpartum depression - Plan: Ambulatory referral to Behavioral Health   Plan:   1. Contraception: condoms 2. Nexplanon planned.  Up to date on pap smear not due until after end of September 2018. Next follow up discuss depression, need for medications as well.  3. Follow up in: 2 weeks or as needed.

## 2016-05-25 ENCOUNTER — Ambulatory Visit: Payer: Medicaid Other | Admitting: Certified Nurse Midwife

## 2016-06-24 ENCOUNTER — Inpatient Hospital Stay (HOSPITAL_COMMUNITY)
Admission: AD | Admit: 2016-06-24 | Discharge: 2016-06-24 | Disposition: A | Discharge status: Home / Self Care | Payer: Medicaid Other | Source: Ambulatory Visit | Attending: Obstetrics & Gynecology | Admitting: Obstetrics & Gynecology

## 2016-06-24 ENCOUNTER — Encounter (HOSPITAL_COMMUNITY): Payer: Self-pay | Admitting: *Deleted

## 2016-06-24 ENCOUNTER — Inpatient Hospital Stay (HOSPITAL_COMMUNITY): Payer: Medicaid Other

## 2016-06-24 DIAGNOSIS — F329 Major depressive disorder, single episode, unspecified: Secondary | ICD-10-CM | POA: Insufficient documentation

## 2016-06-24 DIAGNOSIS — O99343 Other mental disorders complicating pregnancy, third trimester: Secondary | ICD-10-CM | POA: Insufficient documentation

## 2016-06-24 DIAGNOSIS — Z3A01 Less than 8 weeks gestation of pregnancy: Secondary | ICD-10-CM | POA: Diagnosis not present

## 2016-06-24 DIAGNOSIS — F419 Anxiety disorder, unspecified: Secondary | ICD-10-CM | POA: Insufficient documentation

## 2016-06-24 DIAGNOSIS — R109 Unspecified abdominal pain: Secondary | ICD-10-CM

## 2016-06-24 DIAGNOSIS — O26891 Other specified pregnancy related conditions, first trimester: Secondary | ICD-10-CM | POA: Insufficient documentation

## 2016-06-24 LAB — WET PREP, GENITAL
SPERM: NONE SEEN
TRICH WET PREP: NONE SEEN
YEAST WET PREP: NONE SEEN

## 2016-06-24 LAB — CBC
HCT: 40.8 % (ref 36.0–46.0)
HEMOGLOBIN: 13.6 g/dL (ref 12.0–15.0)
MCH: 28.2 pg (ref 26.0–34.0)
MCHC: 33.3 g/dL (ref 30.0–36.0)
MCV: 84.5 fL (ref 78.0–100.0)
Platelets: 308 10*3/uL (ref 150–400)
RBC: 4.83 MIL/uL (ref 3.87–5.11)
RDW: 13 % (ref 11.5–15.5)
WBC: 7.2 10*3/uL (ref 4.0–10.5)

## 2016-06-24 LAB — URINALYSIS, ROUTINE W REFLEX MICROSCOPIC
Bilirubin Urine: NEGATIVE
GLUCOSE, UA: NEGATIVE mg/dL
Hgb urine dipstick: NEGATIVE
KETONES UR: 5 mg/dL — AB
Leukocytes, UA: NEGATIVE
Nitrite: NEGATIVE
PH: 6 (ref 5.0–8.0)
Protein, ur: NEGATIVE mg/dL
Specific Gravity, Urine: 1.021 (ref 1.005–1.030)

## 2016-06-24 LAB — HCG, QUANTITATIVE, PREGNANCY: HCG, BETA CHAIN, QUANT, S: 45732 m[IU]/mL — AB (ref <5)

## 2016-06-24 LAB — POCT PREGNANCY, URINE: Preg Test, Ur: POSITIVE — AB

## 2016-06-24 MED ORDER — PRENATAL FORMULA 27-1 MG PO TABS: 1.0000 | ORAL_TABLET | Freq: Every day | ORAL | 12 refills | Status: DC

## 2016-06-24 NOTE — Discharge Instructions (Signed)
No smoking, no drugs, no alcohol.  Take a prenatal vitamin one by mouth every day.   A prescription was sent to your pharmacy. Eat small frequent snacks to avoid nausea.   Begin prenatal care as soon as possible.

## 2016-06-24 NOTE — MAU Provider Note (Signed)
History     CSN: [id number]  Arrival date and time: 06/24/16 1719   First Provider Initiated Contact with Patient 06/24/16 1758      No chief complaint on file.  HPI Joy Rivera 26 y.o. 7171w5d  Comes to MAU as she thinks she is pregnant today.  Having some lower abdominal pain.  Last baby was premature at 3533 weeks in January 2018.  Was planning to get Nexplanon but missed insertion appointment.  Usually uses condoms but missed one time using a condom when having sex.  Has had one Ectopic pregnancy previously.  OB History    Gravida Para Term Preterm AB Living   6 2 1 1 3 2    SAB TAB Ectopic Multiple Live Births   0 2 1 0 2      Past Medical History:  Diagnosis Date  . Abnormal Pap smear   . Anxiety   . Depression     Past Surgical History:  Procedure Laterality Date  . NO PAST SURGERIES      Family History  Problem Relation Age of Onset  . Hypertension Father   . Heart disease Father   . Cancer Maternal Aunt   . Cancer Maternal Uncle   . Hypertension Paternal Grandmother   . Cancer Maternal Grandmother   . Cancer Maternal Grandfather     Social History  Substance Use Topics  . Smoking status: Never Smoker  . Smokeless tobacco: Never Used  . Alcohol use No    Allergies: No Known Allergies  No prescriptions prior to admission.    Review of Systems  Constitutional: Negative for fever.  Gastrointestinal: Positive for abdominal pain. Negative for nausea and vomiting.  Genitourinary: Negative for dysuria, vaginal bleeding and vaginal discharge.   Physical Exam   Blood pressure 116/72, pulse 65, temperature 98 F (36.7 C), temperature source Oral, resp. rate 18, height 5\' 3"  (1.6 m), weight 182 lb (82.6 kg), last menstrual period 05/15/2016, SpO2 100 %, unknown if currently breastfeeding.  Physical Exam  Nursing note and vitals reviewed. Constitutional: She is oriented to person, place, and time. She appears well-developed and well-nourished.  HENT:   Head: Normocephalic.  Eyes: EOM are normal.  Neck: Neck supple.  GI: Soft. There is no tenderness. There is no rebound and no guarding.  Unable to palpate fundus of uterus externally  Genitourinary:  Genitourinary Comments: Speculum exam: Vagina - minimal amount of white discharge, no odor Cervix - No contact bleeding Bimanual exam: Cervix closed Uterus mild tenderness, enlarged? Adnexa tender on the left side (more tender than the uterine tenderness), no masses bilaterally GC/Chlam, wet prep done Chaperone present for exam.   Musculoskeletal: Normal range of motion.  Neurological: She is alert and oriented to person, place, and time.  Skin: Skin is warm and dry.  Psychiatric: She has a normal mood and affect.    MAU Course  Procedures CLINICAL DATA:  Abdominal pain in pregnancy. Gestational age by last menstrual period 5 weeks and 5 days. History of ectopic pregnancy.  EXAM: OBSTETRIC <14 WK US AND TRANSVAGINAL OB US  TECHNIQUE: Both transabdominal and transvaginal ultrasound examinations were performed for complete evaluation of the gestation as well as the maternal uterus, adnexal regions, and pelvic cul-de-sac. Transvaginal technique was performed to assess early pregnancy.  COMPARISON:  None.  FINDINGS: Intrauterine gestational sac: Present  Yolk sac:  Present  Embryo:  Present  Cardiac Activity: Present  Heart Rate: 155  bpm  CRL:  3  mm  5 w   5 d                  [place] EDC: February 19, 2017  Subchorionic hemorrhage:  None visualized.  Maternal uterus/adnexae: 2 cm ovarian LEFT corpus luteal cyst, normal appearance the adnexae. No free fluid.  IMPRESSION: Single live intrauterine pregnancy, gestational age by ultrasound 5 weeks and 5 days without immediate complication.  MDM Went to [place] prior to labs being drawn.  Assessment and Plan  Abdominal pain in pregnancy, first trimester - likely coming from Corpus luteum cyst - no evidence of  ectopic pregnancy  Plan No smoking, no drugs, no alcohol.  Take a prenatal vitamin one by mouth every day.   A prescription was sent to your pharmacy. Eat small frequent snacks to avoid nausea.   Begin prenatal care as soon as possible. Info on medications that are safe in pregnancy and prenatal care providers given to client.  Joy Rivera L Joy Rivera 06/24/2016, 5:59 PM

## 2016-06-24 NOTE — MAU Note (Signed)
Pt reports she had a positive home preg test a few days ago. Since yesterday she has been having lower abd cramping and lower back pain.

## 2016-06-25 LAB — GC/CHLAMYDIA PROBE AMP (~~LOC~~) NOT AT ARMC
Chlamydia: NEGATIVE
NEISSERIA GONORRHEA: NEGATIVE

## 2016-07-05 ENCOUNTER — Emergency Department (HOSPITAL_COMMUNITY)
Admission: EM | Admit: 2016-07-05 | Discharge: 2016-07-05 | Disposition: A | Discharge status: Home / Self Care | Payer: Medicaid Other | Attending: Emergency Medicine | Admitting: Emergency Medicine

## 2016-07-05 ENCOUNTER — Encounter (HOSPITAL_COMMUNITY): Payer: Self-pay | Admitting: Emergency Medicine

## 2016-07-05 DIAGNOSIS — O210 Mild hyperemesis gravidarum: Secondary | ICD-10-CM | POA: Diagnosis not present

## 2016-07-05 DIAGNOSIS — R1110 Vomiting, unspecified: Secondary | ICD-10-CM

## 2016-07-05 DIAGNOSIS — Z3A01 Less than 8 weeks gestation of pregnancy: Secondary | ICD-10-CM | POA: Diagnosis not present

## 2016-07-05 DIAGNOSIS — O26891 Other specified pregnancy related conditions, first trimester: Secondary | ICD-10-CM | POA: Diagnosis not present

## 2016-07-05 DIAGNOSIS — R197 Diarrhea, unspecified: Secondary | ICD-10-CM | POA: Insufficient documentation

## 2016-07-05 LAB — COMPREHENSIVE METABOLIC PANEL
ALBUMIN: 3.4 g/dL — AB (ref 3.5–5.0)
ALT: 21 U/L (ref 14–54)
ANION GAP: 7 (ref 5–15)
AST: 20 U/L (ref 15–41)
Alkaline Phosphatase: 54 U/L (ref 38–126)
BILIRUBIN TOTAL: 0.6 mg/dL (ref 0.3–1.2)
BUN: 8 mg/dL (ref 6–20)
CO2: 25 mmol/L (ref 22–32)
Calcium: 9 mg/dL (ref 8.9–10.3)
Chloride: 103 mmol/L (ref 101–111)
Creatinine, Ser: 0.77 mg/dL (ref 0.44–1.00)
GFR calc Af Amer: >60 mL/min (ref >60)
GLUCOSE: 95 mg/dL (ref 65–99)
POTASSIUM: 4 mmol/L (ref 3.5–5.1)
Sodium: 135 mmol/L (ref 135–145)
TOTAL PROTEIN: 6.9 g/dL (ref 6.5–8.1)

## 2016-07-05 LAB — CBC
HEMATOCRIT: 42.5 % (ref 36.0–46.0)
HEMOGLOBIN: 14.5 g/dL (ref 12.0–15.0)
MCH: 29.2 pg (ref 26.0–34.0)
MCHC: 34.1 g/dL (ref 30.0–36.0)
MCV: 85.5 fL (ref 78.0–100.0)
Platelets: 249 10*3/uL (ref 150–400)
RBC: 4.97 MIL/uL (ref 3.87–5.11)
RDW: 13 % (ref 11.5–15.5)
WBC: 4.5 10*3/uL (ref 4.0–10.5)

## 2016-07-05 LAB — LIPASE, BLOOD: Lipase: 15 U/L (ref 11–51)

## 2016-07-05 MED ORDER — METOCLOPRAMIDE HCL 5 MG/ML IJ SOLN
10.0000 mg | Freq: Once | INTRAMUSCULAR | Status: AC
  Administered 2016-07-05: 10 mg via INTRAVENOUS
  Filled 2016-07-05: qty 2

## 2016-07-05 MED ORDER — ONDANSETRON 4 MG PO TBDP
4.0000 mg | ORAL_TABLET | Freq: Once | ORAL | Status: AC | PRN
  Administered 2016-07-05: 4 mg via ORAL

## 2016-07-05 MED ORDER — SODIUM CHLORIDE 0.9 % IV SOLN
1000.0000 mL | INTRAVENOUS | Status: DC
  Administered 2016-07-05: 1000 mL via INTRAVENOUS

## 2016-07-05 MED ORDER — SODIUM CHLORIDE 0.9 % IV BOLUS (SEPSIS)
1000.0000 mL | Freq: Once | INTRAVENOUS | Status: AC
  Administered 2016-07-05: 1000 mL via INTRAVENOUS

## 2016-07-05 MED ORDER — ONDANSETRON 4 MG PO TBDP
ORAL_TABLET | ORAL | Status: AC
  Filled 2016-07-05: qty 1

## 2016-07-05 MED ORDER — METOCLOPRAMIDE HCL 10 MG PO TABS: 10.0000 mg | ORAL_TABLET | Freq: Four times a day (QID) | ORAL | 0 refills | Status: DC

## 2016-07-05 MED ORDER — FAMOTIDINE 20 MG PO TABS: 20.0000 mg | ORAL_TABLET | Freq: Two times a day (BID) | ORAL | 0 refills | Status: DC

## 2016-07-05 NOTE — ED Triage Notes (Signed)
Pt stated started vomiting and having diarrhea after eating a taco- 30 minutes after-- has been unable to keep anything down -- also has diarrhea every time she vomits-- pt also is [redacted] weeks pregnant- states having spotting also.

## 2016-07-05 NOTE — ED Provider Notes (Signed)
[facility]-EMER[facility]ENCY DEPT Provider Note   CSN: [id number] Arrival date & time: 07/05/16  1328  By signing my name below, I, Modena JanskyAlbert Thayil, attest that this documentation has been prepared under the direction and in the presence of Linwood DibblesKnapp, Raesha Coonrod, MD. Electronically Signed: Modena JanskyAlbert Thayil, Scribe. 07/05/2016. 4:40 PM.  History   Chief Complaint Chief Complaint  Patient presents with  . Emesis  . Diarrhea  . Vaginal Bleeding   The history is provided by the patient. No language interpreter was used.   HPI Comments: Joy Nailiffany A Rivera is a 26 y.o. female, [phone] @ 5720w2d, who presents to the Emergency Department complaining of intermittent vomiting that started a few days ago. She states she had vomiting and diarrhea after eating a taco at work. Her episodes worsened with associated nausea and abdominal pain. She was given Zofran in the ED without relief. She has watery diarrhea every 15 minutes and her last episode of vomiting was this morning. She reports associated vaginal spotting. Denies any dysuria or other complaints at this time.  Past Medical History:  Diagnosis Date  . Abnormal Pap smear   . Anxiety   . Depression     Patient Active Problem List   Diagnosis Date Noted  . GBS (group B Streptococcus carrier), +RV culture, currently pregnant 03/05/2016  . Preterm labor 03/02/2016  . Suspected fetal anomaly, antepartum 03/02/2016  . Genital herpes 01/02/2016  . Genital warts complicating pregnancy, second trimester 01/02/2016  . Rubella non-immune status, antepartum 10/31/2015  . Supervision of other normal pregnancy, antepartum 10/27/2015    Past Surgical History:  Procedure Laterality Date  . NO PAST SURGERIES      OB History    Gravida Para Term Preterm AB Living   6 2 1 1 3 2    SAB TAB Ectopic Multiple Live Births   0 2 1 0 2       Home Medications    Prior to Admission medications   Medication Sig Start Date End Date Taking? Authorizing Provider  famotidine (PEPCID)  20 MG tablet Take 1 tablet (20 mg total) by mouth 2 (two) times daily. 07/05/16   Linwood DibblesKnapp, Miller Limehouse, MD  metoCLOPramide (REGLAN) 10 MG tablet Take 1 tablet (10 mg total) by mouth every 6 (six) hours. 07/05/16   Linwood DibblesKnapp, Ankur Snowdon, MD  Prenatal Vit-Fe Fumarate-FA (PRENATAL FORMULA) 27-1 MG tablet Take 1 tablet by mouth daily. 06/24/16   Currie ParisBurleson, Terri L, NP    Family History Family History  Problem Relation Age of Onset  . Hypertension Father   . Heart disease Father   . Cancer Maternal Aunt   . Cancer Maternal Uncle   . Hypertension Paternal Grandmother   . Cancer Maternal Grandmother   . Cancer Maternal Grandfather     Social History Social History  Substance Use Topics  . Smoking status: Never Smoker  . Smokeless tobacco: Never Used  . Alcohol use No     Allergies   Patient has no known allergies.   Review of Systems Review of Systems  Gastrointestinal: Positive for abdominal pain, diarrhea, nausea and vomiting.  Genitourinary: Positive for vaginal bleeding (Spotting). Negative for dysuria.  All other systems reviewed and are negative.    Physical Exam Updated Vital Signs BP 121/73   Pulse 83   Temp (S) 99.8 F (37.7 C) (Oral)   Resp 16   Ht 5\' 3"  (1.6 m)   Wt 182 lb (82.6 kg)   LMP 05/15/2016   SpO2 100%   BMI 32.24 kg/m  Physical Exam  Constitutional: She appears well-developed and well-nourished. No distress.  HENT:  Head: Normocephalic and atraumatic.  Right Ear: External ear normal.  Left Ear: External ear normal.  Eyes: Conjunctivae are normal. Right eye exhibits no discharge. Left eye exhibits no discharge. No scleral icterus.  Neck: Neck supple. No tracheal deviation present.  Cardiovascular: Normal rate, regular rhythm and intact distal pulses.   Pulmonary/Chest: Effort normal and breath sounds normal. No stridor. No respiratory distress. She has no wheezes. She has no rales.  Abdominal: Soft. Bowel sounds are normal. She exhibits no distension. There is no  tenderness. There is no rebound and no guarding.  Musculoskeletal: She exhibits no edema or tenderness.  Neurological: She is alert. She has normal strength. No cranial nerve deficit (no facial droop, extraocular movements intact, no slurred speech) or sensory deficit. She exhibits normal muscle tone. She displays no seizure activity. Coordination normal.  Skin: Skin is warm and dry. No rash noted.  Psychiatric: She has a normal mood and affect.  Nursing note and vitals reviewed.    ED Treatments / Results  DIAGNOSTIC STUDIES: Oxygen Saturation is 100% on RA, normal by my interpretation.    COORDINATION OF CARE: 4:44 PM- Pt advised of plan for treatment and pt agrees.  Labs (all labs ordered are listed, but only abnormal results are displayed) Labs Reviewed  COMPREHENSIVE METABOLIC PANEL - Abnormal; Notable for the following:       Result Value   Albumin 3.4 (*)    All other components within normal limits  LIPASE, BLOOD  CBC  URINALYSIS, ROUTINE W REFLEX MICROSCOPIC    EKG  EKG Interpretation None       Radiology No results found.  Procedures Procedures (including critical care time)  Medications Ordered in ED Medications  ondansetron (ZOFRAN-ODT) 4 MG disintegrating tablet (not administered)  sodium chloride 0.9 % bolus 1,000 mL (not administered)    Followed by  0.9 %  sodium chloride infusion (1,000 mLs Intravenous New Bag/Given 07/05/16 1743)  ondansetron (ZOFRAN-ODT) disintegrating tablet 4 mg (4 mg Oral Given 07/05/16 1453)  metoCLOPramide (REGLAN) injection 10 mg (10 mg Intravenous Given 07/05/16 1743)     Initial Impression / Assessment and Plan / ED Course  I have reviewed the triage vital signs and the nursing notes.  Pertinent labs & imaging results that were available during my care of the patient were reviewed by me and considered in my medical decision making (see chart for details).   Pt presents to the ED with nausea and vomiting.  Possible  foodborne illness vs viral GE and morning sickness.   No diarrhea or vomiting in the ED.  Improved with fluids.  Will dc home with antacids and reglan.  Pt mentioned some spotting.  No pain in the lower abdomen.  Benign exam.  Recent US showed an IUP.  Blood type is a positive.  Discussed close monitoring.  Follow up with Ob GYN  Final Clinical Impressions(s) / ED Diagnoses   Final diagnoses:  Vomiting and diarrhea    New Prescriptions New Prescriptions   FAMOTIDINE (PEPCID) 20 MG TABLET    Take 1 tablet (20 mg total) by mouth 2 (two) times daily.   METOCLOPRAMIDE (REGLAN) 10 MG TABLET    Take 1 tablet (10 mg total) by mouth every 6 (six) hours.   I personally performed the services described in this documentation, which was scribed in my presence.  The recorded information has been reviewed and is accurate.  Linwood Dibbles, MD 07/05/16 [phone]

## 2016-07-05 NOTE — Discharge Instructions (Signed)
Follow up with your ob gyn doctor, take the medications as needed for your symptoms, return as needed for worsening symptoms

## 2016-07-06 NOTE — Addendum Note (Signed)
Addendum  created 07/06/16 0942 by Carolyna Yerian D, MD   Sign clinical note    

## 2016-08-30 ENCOUNTER — Encounter (HOSPITAL_COMMUNITY): Payer: Self-pay | Admitting: *Deleted

## 2016-08-30 ENCOUNTER — Inpatient Hospital Stay (HOSPITAL_COMMUNITY)
Admission: AD | Admit: 2016-08-30 | Discharge: 2016-08-30 | Disposition: A | Discharge status: Home / Self Care | Payer: Medicaid Other | Source: Ambulatory Visit | Attending: Obstetrics and Gynecology | Admitting: Obstetrics and Gynecology

## 2016-08-30 DIAGNOSIS — Z3A15 15 weeks gestation of pregnancy: Secondary | ICD-10-CM | POA: Insufficient documentation

## 2016-08-30 DIAGNOSIS — O26892 Other specified pregnancy related conditions, second trimester: Secondary | ICD-10-CM

## 2016-08-30 DIAGNOSIS — O209 Hemorrhage in early pregnancy, unspecified: Secondary | ICD-10-CM | POA: Insufficient documentation

## 2016-08-30 DIAGNOSIS — R109 Unspecified abdominal pain: Secondary | ICD-10-CM

## 2016-08-30 DIAGNOSIS — O26852 Spotting complicating pregnancy, second trimester: Secondary | ICD-10-CM

## 2016-08-30 DIAGNOSIS — Z8249 Family history of ischemic heart disease and other diseases of the circulatory system: Secondary | ICD-10-CM | POA: Insufficient documentation

## 2016-08-30 LAB — URINALYSIS, ROUTINE W REFLEX MICROSCOPIC
BILIRUBIN URINE: NEGATIVE
GLUCOSE, UA: NEGATIVE mg/dL
Hgb urine dipstick: NEGATIVE
KETONES UR: NEGATIVE mg/dL
Leukocytes, UA: NEGATIVE
NITRITE: NEGATIVE
PH: 7 (ref 5.0–8.0)
Protein, ur: NEGATIVE mg/dL
Specific Gravity, Urine: 1.006 (ref 1.005–1.030)

## 2016-08-30 LAB — WET PREP, GENITAL
Clue Cells Wet Prep HPF POC: NONE SEEN
Sperm: NONE SEEN
Trich, Wet Prep: NONE SEEN
Yeast Wet Prep HPF POC: NONE SEEN

## 2016-08-30 NOTE — MAU Provider Note (Signed)
History     CSN: [id number]  Arrival date and time: 08/30/16 0746   None     Chief Complaint  Patient presents with  . Abdominal Pain  . Headache  . Vaginal Bleeding   HPI   Patient is 26 y.o. [id number] 3417w2d here with complaints of abdominal cramping, headache, and vaginal bleeding. Symptoms started yesterday. Patient has not tried anything for symptoms. She is currently [redacted]wks pregnant by early US. She is unsure on whether she wants to keep pregnancy. Vaginal bleeding was light pink with some streaks of blood. No blood noticed today. Patient has had intercourse recently. She has not received any prenatal care for this pregnancy. Cramping is continuous. Rates pain a 6.   Last baby delivered 03/04/16 and was preterm. She has short interval between pregnancies. H/o of ectopic pregnancy  - 2017.    Past Medical History:  Diagnosis Date  . Abnormal Pap smear   . Anxiety   . Depression     Past Surgical History:  Procedure Laterality Date  . NO PAST SURGERIES      Family History  Problem Relation Age of Onset  . Hypertension Father   . Heart disease Father   . Cancer Maternal Aunt   . Cancer Maternal Uncle   . Hypertension Paternal Grandmother   . Cancer Maternal Grandmother   . Cancer Maternal Grandfather     Social History  Substance Use Topics  . Smoking status: Never Smoker  . Smokeless tobacco: Never Used  . Alcohol use No    Allergies: No Known Allergies  Prescriptions Prior to Admission  Medication Sig Dispense Refill Last Dose  . famotidine (PEPCID) 20 MG tablet Take 1 tablet (20 mg total) by mouth 2 (two) times daily. 14 tablet 0   . metoCLOPramide (REGLAN) 10 MG tablet Take 1 tablet (10 mg total) by mouth every 6 (six) hours. 30 tablet 0   . Prenatal Vit-Fe Fumarate-FA (PRENATAL FORMULA) 27-1 MG tablet Take 1 tablet by mouth daily. 30 tablet 12     Review of Systems  All systems reviewed and are negative for acute change except as noted in the  HPI.  Physical Exam   Blood pressure 109/63, pulse 79, temperature 98.6 F (37 C), temperature source Oral, resp. rate 16, last menstrual period 05/15/2016, unknown if currently breastfeeding.  Physical Exam Constitutional: Well-developed, well-nourished female in no acute distress.  Cardiovascular: RRR Respiratory:CTAB, normal work of breathing GI: Abd soft, mild tenderness in LLQ, non-distended. Pos BS x 4 MS: Extremities nontender, no edema, normal ROM Neurologic: Alert and oriented x 4.  GU: Neg CVAT. SPECULUM EXAM: NEFG, physiologic discharge, no blood noted, cervix clean BIMANUAL: cervix closed; uterus normal size, no adnexal tenderness or masses. No CMT.  MAU Course  Procedures  MDM -Obtain cultures -Reviewed 5wk US that showed intrauterine pregnancy -UA negative -Wet prep negative  Results for orders placed or performed during the hospital encounter of 08/30/16 (from the past 24 hour(s))  Urinalysis, Routine w reflex microscopic     Status: Abnormal   Collection Time: 08/30/16  8:13 AM  Result Value Ref Range   Color, Urine STRAW (A) YELLOW   APPearance CLEAR CLEAR   Specific Gravity, Urine 1.006 1.005 - 1.030   pH 7.0 5.0 - 8.0   Glucose, UA NEGATIVE NEGATIVE mg/dL   Hgb urine dipstick NEGATIVE NEGATIVE   Bilirubin Urine NEGATIVE NEGATIVE   Ketones, ur NEGATIVE NEGATIVE mg/dL   Protein, ur NEGATIVE NEGATIVE mg/dL   Nitrite  NEGATIVE NEGATIVE   Leukocytes, UA NEGATIVE NEGATIVE  Wet prep, genital     Status: Abnormal   Collection Time: 08/30/16  8:38 AM  Result Value Ref Range   Yeast Wet Prep HPF POC NONE SEEN NONE SEEN   Trich, Wet Prep NONE SEEN NONE SEEN   Clue Cells Wet Prep HPF POC NONE SEEN NONE SEEN   WBC, Wet Prep HPF POC FEW (A) NONE SEEN   Sperm NONE SEEN     Assessment and Plan  A: Patient is 26 y.o. [id number] 2650w2d reporting cramping and vaginal bleeding likely secondary to cervical/uterine irritability. R/o ectopic with normal early US with  intrauterine pregnancy. At risk for miscarriage with short interval between pregnancies and spotting.   P:  -UA and wet prep negative for signs of infection -Obtained GC/Chl cultures; results pending - Reviewed findings and my conclusion - preterm labor precautions dicussed -advised patient to try tylenol for cramping pain, hydration, and avoid intercourse for 24hrs - Handout given - Follow-up with OB provider to establish prenatal care  Discharge home  Caryl AdaJazma Haedyn Ancrum, DO [facility], [facility] [place] 08/30/2016, 8:22 AM

## 2016-08-30 NOTE — MAU Note (Signed)
Pt C/O HA & back pain for 2 days, noticed some discharge with flecks of blood yesterday, started cramping last night.  No bleeding today.

## 2016-08-30 NOTE — Discharge Instructions (Signed)

## 2016-08-31 LAB — GC/CHLAMYDIA PROBE AMP (~~LOC~~) NOT AT ARMC
Chlamydia: NEGATIVE
Neisseria Gonorrhea: NEGATIVE

## 2016-09-02 ENCOUNTER — Inpatient Hospital Stay (HOSPITAL_COMMUNITY)
Admission: AD | Admit: 2016-09-02 | Discharge: 2016-09-02 | Disposition: A | Discharge status: Home / Self Care | Payer: Self-pay | Source: Ambulatory Visit | Attending: Obstetrics and Gynecology | Admitting: Obstetrics and Gynecology

## 2016-09-02 ENCOUNTER — Encounter (HOSPITAL_COMMUNITY): Payer: Self-pay

## 2016-09-02 DIAGNOSIS — F419 Anxiety disorder, unspecified: Secondary | ICD-10-CM | POA: Insufficient documentation

## 2016-09-02 DIAGNOSIS — O99342 Other mental disorders complicating pregnancy, second trimester: Secondary | ICD-10-CM | POA: Insufficient documentation

## 2016-09-02 DIAGNOSIS — O26892 Other specified pregnancy related conditions, second trimester: Secondary | ICD-10-CM | POA: Insufficient documentation

## 2016-09-02 DIAGNOSIS — Z3A15 15 weeks gestation of pregnancy: Secondary | ICD-10-CM | POA: Insufficient documentation

## 2016-09-02 DIAGNOSIS — R109 Unspecified abdominal pain: Secondary | ICD-10-CM | POA: Insufficient documentation

## 2016-09-02 DIAGNOSIS — F329 Major depressive disorder, single episode, unspecified: Secondary | ICD-10-CM | POA: Insufficient documentation

## 2016-09-02 LAB — URINALYSIS, ROUTINE W REFLEX MICROSCOPIC
BILIRUBIN URINE: NEGATIVE
Glucose, UA: NEGATIVE mg/dL
Hgb urine dipstick: NEGATIVE
KETONES UR: NEGATIVE mg/dL
Leukocytes, UA: NEGATIVE
NITRITE: NEGATIVE
PH: 6 (ref 5.0–8.0)
Protein, ur: NEGATIVE mg/dL
Specific Gravity, Urine: 1.019 (ref 1.005–1.030)

## 2016-09-02 LAB — RAPID URINE DRUG SCREEN, HOSP PERFORMED
Amphetamines: NOT DETECTED
BARBITURATES: NOT DETECTED
Benzodiazepines: NOT DETECTED
COCAINE: NOT DETECTED
Opiates: NOT DETECTED
Tetrahydrocannabinol: POSITIVE — AB

## 2016-09-02 NOTE — Discharge Instructions (Signed)

## 2016-09-02 NOTE — MAU Provider Note (Signed)
History   [id number] @15 .5 wks in with persistent  abd pain in preg. Denies any other complaints.  CSN: [id number]  Arrival date & time 09/02/16  1340   None     No chief complaint on file.   HPI  Past Medical History:  Diagnosis Date  . Abnormal Pap smear   . Anxiety   . Depression     Past Surgical History:  Procedure Laterality Date  . NO PAST SURGERIES      Family History  Problem Relation Age of Onset  . Hypertension Father   . Heart disease Father   . Cancer Maternal Aunt   . Cancer Maternal Uncle   . Hypertension Paternal Grandmother   . Cancer Maternal Grandmother   . Cancer Maternal Grandfather     Social History  Substance Use Topics  . Smoking status: Never Smoker  . Smokeless tobacco: Never Used  . Alcohol use No    OB History    Gravida Para Term Preterm AB Living   6 2 1 1 3 2    SAB TAB Ectopic Multiple Live Births   0 2 1 0 2      Review of Systems  Constitutional: Negative.   HENT: Negative.   Eyes: Negative.   Respiratory: Negative.   Cardiovascular: Negative.   Gastrointestinal: Positive for abdominal pain.  Endocrine: Negative.   Genitourinary: Negative.   Musculoskeletal: Negative.   Skin: Negative.   Allergic/Immunologic: Negative.   Neurological: Negative.   Hematological: Negative.   Psychiatric/Behavioral: Negative.     Allergies  Patient has no known allergies.  Home Medications    BP 113/69 (BP Location: Right Arm)   Pulse 91   Temp 98.2 F (36.8 C) (Oral)   Resp 16   Wt 183 lb (83 kg)   LMP 05/15/2016   SpO2 100%   BMI 32.42 kg/m   Physical Exam  Constitutional: She is oriented to person, place, and time. She appears well-developed and well-nourished.  HENT:  Head: Normocephalic.  Eyes: Pupils are equal, round, and reactive to light.  Neck: Normal range of motion.  Cardiovascular: Normal rate, regular rhythm, normal heart sounds and intact distal pulses.   Pulmonary/Chest: Effort normal and breath  sounds normal.  Abdominal: Soft. Bowel sounds are normal.  Genitourinary: Vagina normal and uterus normal.  Musculoskeletal: Normal range of motion.  Neurological: She is alert and oriented to person, place, and time. She has normal reflexes.  Skin: Skin is warm and dry.  Psychiatric: She has a normal mood and affect. Her behavior is normal. Judgment and thought content normal.    MAU Course  Procedures (including critical care time)  Labs Reviewed  URINALYSIS, ROUTINE W REFLEX MICROSCOPIC  RAPID URINE DRUG SCREEN, HOSP PERFORMED   No results found.   No diagnosis found.    MDM  FHR 156 st and reg with doppler. SVE firm/cl/post/high. No vag bleeding or ROM noted. Discussed with pt that this is common discomfort of pregnancy. A maternity girdle might help the discomfort. Will d/c pt home in stable condition.

## 2016-09-02 NOTE — MAU Note (Signed)
was here a couple days ago for cramping, back pain and bleeding.  Has continued, cramping is worse. Has taken Tylenol, not helping. Cultures were neg.  Pain was so bad, she had to leave work.

## 2016-09-09 ENCOUNTER — Encounter (HOSPITAL_COMMUNITY): Payer: Self-pay | Admitting: Emergency Medicine

## 2016-09-09 ENCOUNTER — Emergency Department (HOSPITAL_COMMUNITY)
Admission: EM | Admit: 2016-09-09 | Discharge: 2016-09-09 | Disposition: A | Discharge status: Home / Self Care | Payer: Medicaid Other | Attending: Emergency Medicine | Admitting: Emergency Medicine

## 2016-09-09 DIAGNOSIS — O98812 Other maternal infectious and parasitic diseases complicating pregnancy, second trimester: Secondary | ICD-10-CM | POA: Diagnosis not present

## 2016-09-09 DIAGNOSIS — J020 Streptococcal pharyngitis: Secondary | ICD-10-CM | POA: Diagnosis not present

## 2016-09-09 DIAGNOSIS — O9989 Other specified diseases and conditions complicating pregnancy, childbirth and the puerperium: Secondary | ICD-10-CM | POA: Diagnosis present

## 2016-09-09 DIAGNOSIS — Z3A16 16 weeks gestation of pregnancy: Secondary | ICD-10-CM | POA: Diagnosis not present

## 2016-09-09 LAB — RAPID STREP SCREEN (MED CTR MEBANE ONLY): Streptococcus, Group A Screen (Direct): POSITIVE — AB

## 2016-09-09 MED ORDER — PENICILLIN G BENZATHINE 1200000 UNIT/2ML IM SUSP
1.2000 10*6.[IU] | Freq: Once | INTRAMUSCULAR | Status: AC
  Administered 2016-09-09: 1.2 10*6.[IU] via INTRAMUSCULAR
  Filled 2016-09-09: qty 2

## 2016-09-09 MED ORDER — PRENATAL COMPLETE 14-0.4 MG PO TABS: 1.0000 | ORAL_TABLET | Freq: Every day | ORAL | 0 refills | Status: DC

## 2016-09-09 NOTE — ED Triage Notes (Signed)
Pt complains of a sore throat believing it is strep throat.  Denies N/V/diaharria, SOB or weakness.  Pt reports she is [redacted] weeks pregnant.

## 2016-09-09 NOTE — ED Notes (Signed)
Pt understood material. NAD noted

## 2016-09-09 NOTE — Discharge Instructions (Signed)
Please fill your prescription for prenatal vitamins and please take 1 tablet daily. Please call get established with an OB/GYN or follow up with her previous OB/GYN.  You have been treated for strep pharyngitis in the emergency department today. If you develop new or worsening symptoms including difficulty swallowing, a fever despite taking Tylenol, or the inability to open your mouth all the way, please return to the emergency department for reevaluation.

## 2016-09-09 NOTE — ED Provider Notes (Signed)
[facility] DEPT Provider Note   CSN: [id number] Arrival date & time: 09/09/16  2022     History   Chief Complaint Chief Complaint  Patient presents with  . Sore Throat    HPI Joy Rivera is a 26 y.o. female who presents to the emergency department with a chief complaint of sore throat 2 days. She reports she is currently [redacted] weeks pregnant. She reports her symptoms started with right ear pain that radiated down to the right side of her throat, but moved to the left side 2 days ago. She reports an associated subjective fever, discomfort with swallowing, non-productive cough, and anterior neck pain. She denies rash, chills, trismus, drooling, throat swelling, dyspnea, rhinorrhea, or congestion. She reports she has tested positive for strep throat several times in the past. Sick contacts include her son who was sick with similar symptoms over the past week. She has not taken him for evaluation of his symptoms because he has remained well appearing. She has treated her symptoms with honey and tea, which has improved the pain.   She states that she has not seen an OBGYN yet during her pregnancy and is unsure when she will make an appointment in the near future.   The history is provided by the patient. No language interpreter was used.    Past Medical History:  Diagnosis Date  . Abnormal Pap smear   . Anxiety   . Depression     Patient Active Problem List   Diagnosis Date Noted  . GBS (group B Streptococcus carrier), +RV culture, currently pregnant 03/05/2016  . Preterm labor 03/02/2016  . Suspected fetal anomaly, antepartum 03/02/2016  . Genital herpes 01/02/2016  . Genital warts complicating pregnancy, second trimester 01/02/2016  . Rubella non-immune status, antepartum 10/31/2015  . Supervision of other normal pregnancy, antepartum 10/27/2015    Past Surgical History:  Procedure Laterality Date  . NO PAST SURGERIES      OB History    Gravida Para Term Preterm AB  Living   6 2 1 1 3 2    SAB TAB Ectopic Multiple Live Births   0 2 1 0 2       Home Medications    Prior to Admission medications   Medication Sig Start Date End Date Taking? Authorizing Provider  acetaminophen (TYLENOL) 500 MG tablet Take 500-1,000 mg by mouth every 6 (six) hours as needed for mild pain, moderate pain, fever or headache.    [provider]  Prenatal Vit-Fe Fumarate-FA (PRENATAL COMPLETE) 14-0.4 MG TABS Take 1 tablet by mouth daily. 09/09/16   Zion Lint A, PA-C    Family History Family History  Problem Relation Age of Onset  . Hypertension Father   . Heart disease Father   . Cancer Maternal Aunt   . Cancer Maternal Uncle   . Hypertension Paternal Grandmother   . Cancer Maternal Grandmother   . Cancer Maternal Grandfather     Social History Social History  Substance Use Topics  . Smoking status: Never Smoker  . Smokeless tobacco: Never Used  . Alcohol use No     Allergies   Patient has no known allergies.   Review of Systems Review of Systems  Constitutional: Positive for fever. Negative for activity change and chills.  HENT: Positive for ear pain, sore throat and trouble swallowing. Negative for facial swelling, rhinorrhea, sinus pain, sinus pressure and voice change.   Respiratory: Positive for cough. Negative for shortness of breath.   Cardiovascular: Negative for  chest pain.  Gastrointestinal: Negative for abdominal pain.  Musculoskeletal: Negative for back pain.  Skin: Negative for rash.     Physical Exam Updated Vital Signs BP (!) 103/54   Pulse 83   Temp 98.8 F (37.1 C) (Oral)   Resp 18   Ht 5\' 3"  (1.6 m)   Wt 82.6 kg (182 lb)   LMP 05/15/2016   SpO2 98%   BMI 32.24 kg/m   Physical Exam  Constitutional: She appears well-developed and well-nourished. No distress.  HENT:  Head: Normocephalic.  Right Ear: Tympanic membrane is not erythematous and not bulging. A middle ear effusion is present.  Left Ear: Tympanic  membrane is not erythematous and not bulging. A middle ear effusion is present.  Nose: Nose normal. Right sinus exhibits no maxillary sinus tenderness and no frontal sinus tenderness. Left sinus exhibits no maxillary sinus tenderness and no frontal sinus tenderness.  Mouth/Throat: Uvula is midline and mucous membranes are normal. No trismus in the jaw. No uvula swelling. Posterior oropharyngeal edema and posterior oropharyngeal erythema present. No oropharyngeal exudate or tonsillar abscesses. No tonsillar exudate.  Eyes: Conjunctivae are normal.  Neck: Normal range of motion. Neck supple.  Cardiovascular: Normal rate, regular rhythm and normal heart sounds.  Exam reveals no gallop and no friction rub.   No murmur heard. Pulmonary/Chest: Effort normal and breath sounds normal. No respiratory distress. She has no wheezes. She has no rales.  Abdominal: Soft. She exhibits no distension.  Lymphadenopathy:    She has cervical adenopathy.  Neurological: She is alert.  Skin: Skin is warm. No rash noted. She is not diaphoretic.  Psychiatric: Her behavior is normal.  Nursing note and vitals reviewed.    ED Treatments / Results  Labs (all labs ordered are listed, but only abnormal results are displayed) Labs Reviewed  RAPID STREP SCREEN (NOT AT [facility]) - Abnormal; Notable for the following:       Result Value   Streptococcus, Group A Screen (Direct) POSITIVE (*)    All other components within normal limits    EKG  EKG Interpretation None       Radiology No results found.  Procedures Procedures (including critical care time)  Medications Ordered in ED Medications  penicillin g benzathine (BICILLIN LA) 1200000 UNIT/2ML injection 1.2 Million Units (1.2 Million Units Intramuscular Given 09/09/16 2154)     Initial Impression / Assessment and Plan / ED Course  I have reviewed the triage vital signs and the nursing notes.  Pertinent labs & imaging results that were available during my  care of the patient were reviewed by me and considered in my medical decision making (see chart for details).     26 year old female who is currently [redacted] weeks pregnant presenting with sore throat. Rapid strep positive. Treated with PCN in the ED. The patient has not sought prenatal care during her pregnancy. Will provide a prescription for prenatal vitamins. Referral given to OBGYN. Strict return precautions given. NAD. The patient is safe for d/c at this time.   Final Clinical Impressions(s) / ED Diagnoses   Final diagnoses:  Strep pharyngitis    New Prescriptions Discharge Medication List as of 09/09/2016 10:16 PM    START taking these medications   Details  Prenatal Vit-Fe Fumarate-FA (PRENATAL COMPLETE) 14-0.4 MG TABS Take 1 tablet by mouth daily., Starting Sun 09/09/2016, Print         Jimena Wieczorek A, PA-C 09/10/16 0037    Lavera GuiseLiu, Dana Duo, MD 09/12/16 [phone]

## 2016-10-11 ENCOUNTER — Emergency Department (HOSPITAL_COMMUNITY)
Admission: EM | Admit: 2016-10-11 | Discharge: 2016-10-11 | Disposition: A | Discharge status: Home / Self Care | Payer: Medicaid Other | Attending: Emergency Medicine | Admitting: Emergency Medicine

## 2016-10-11 ENCOUNTER — Encounter (HOSPITAL_COMMUNITY): Payer: Self-pay | Admitting: Emergency Medicine

## 2016-10-11 DIAGNOSIS — Z3A21 21 weeks gestation of pregnancy: Secondary | ICD-10-CM

## 2016-10-11 DIAGNOSIS — O26892 Other specified pregnancy related conditions, second trimester: Secondary | ICD-10-CM | POA: Diagnosis not present

## 2016-10-11 DIAGNOSIS — R1030 Lower abdominal pain, unspecified: Secondary | ICD-10-CM | POA: Diagnosis not present

## 2016-10-11 MED ORDER — PRENATAL COMPLETE 14-0.4 MG PO TABS: 1.0000 | ORAL_TABLET | Freq: Every day | ORAL | 0 refills | Status: DC

## 2016-10-11 NOTE — Plan of Care (Signed)
Called by RN from ED about pt. Was reported to me that the pt is 21w 2d. Let RN know we would not be doing cont fhr monitoring. Let they would normally just doppler. Advised them to phone [facility] back id after more evaluation it was felt the pt was 23w or more.
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
## 2016-10-11 NOTE — Plan of Care (Signed)
Spoke with Dr. Jolayne Pantheronstant still also states given pts gestional age that pt would not need cont FHR monitoring. That ED would doppler.
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
## 2016-10-11 NOTE — ED Notes (Signed)
This RN went in to dc patient. Pt not in room

## 2016-10-17 ENCOUNTER — Encounter: Payer: Medicaid Other | Admitting: Certified Nurse Midwife

## 2016-10-23 ENCOUNTER — Ambulatory Visit (INDEPENDENT_AMBULATORY_CARE_PROVIDER_SITE_OTHER): Payer: Medicaid Other | Admitting: Family Medicine

## 2016-10-23 ENCOUNTER — Other Ambulatory Visit (HOSPITAL_COMMUNITY)
Admission: RE | Admit: 2016-10-23 | Discharge: 2016-10-23 | Disposition: A | Discharge status: Home / Self Care | Payer: Medicaid Other | Source: Ambulatory Visit | Attending: Family Medicine | Admitting: Family Medicine

## 2016-10-23 ENCOUNTER — Encounter: Payer: Self-pay | Admitting: Family Medicine

## 2016-10-23 VITALS — BP 115/73 | HR 75 | Wt 177.2 lb

## 2016-10-23 DIAGNOSIS — O0990 Supervision of high risk pregnancy, unspecified, unspecified trimester: Secondary | ICD-10-CM | POA: Insufficient documentation

## 2016-10-23 DIAGNOSIS — O0992 Supervision of high risk pregnancy, unspecified, second trimester: Secondary | ICD-10-CM | POA: Diagnosis not present

## 2016-10-23 DIAGNOSIS — O09899 Supervision of other high risk pregnancies, unspecified trimester: Secondary | ICD-10-CM | POA: Insufficient documentation

## 2016-10-23 DIAGNOSIS — O09292 Supervision of pregnancy with other poor reproductive or obstetric history, second trimester: Secondary | ICD-10-CM

## 2016-10-23 DIAGNOSIS — O09219 Supervision of pregnancy with history of pre-term labor, unspecified trimester: Secondary | ICD-10-CM | POA: Diagnosis present

## 2016-10-23 DIAGNOSIS — Z124 Encounter for screening for malignant neoplasm of cervix: Secondary | ICD-10-CM

## 2016-10-23 DIAGNOSIS — O09212 Supervision of pregnancy with history of pre-term labor, second trimester: Secondary | ICD-10-CM | POA: Diagnosis not present

## 2016-10-23 DIAGNOSIS — Z113 Encounter for screening for infections with a predominantly sexual mode of transmission: Secondary | ICD-10-CM

## 2016-10-23 DIAGNOSIS — Z8759 Personal history of other complications of pregnancy, childbirth and the puerperium: Secondary | ICD-10-CM | POA: Diagnosis present

## 2016-10-23 DIAGNOSIS — Z331 Pregnant state, incidental: Secondary | ICD-10-CM

## 2016-10-23 DIAGNOSIS — Z1389 Encounter for screening for other disorder: Secondary | ICD-10-CM | POA: Diagnosis not present

## 2016-10-23 DIAGNOSIS — O09299 Supervision of pregnancy with other poor reproductive or obstetric history, unspecified trimester: Secondary | ICD-10-CM

## 2016-10-23 LAB — POCT URINALYSIS DIP (DEVICE)
BILIRUBIN URINE: NEGATIVE
Glucose, UA: NEGATIVE mg/dL
Hgb urine dipstick: NEGATIVE
LEUKOCYTES UA: NEGATIVE
NITRITE: NEGATIVE
Protein, ur: 30 mg/dL — AB
SPECIFIC GRAVITY, URINE: 1.02 (ref 1.005–1.030)
Urobilinogen, UA: 0.2 mg/dL (ref 0.0–1.0)
pH: 7 (ref 5.0–8.0)

## 2016-10-23 NOTE — Progress Notes (Signed)
Patient is here today for a New OB visit.
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
## 2016-10-25 ENCOUNTER — Ambulatory Visit (HOSPITAL_COMMUNITY)
Admission: RE | Admit: 2016-10-25 | Discharge: 2016-10-25 | Disposition: A | Discharge status: Home / Self Care | Payer: Medicaid Other | Source: Ambulatory Visit | Attending: Family Medicine | Admitting: Family Medicine

## 2016-10-25 ENCOUNTER — Encounter (HOSPITAL_COMMUNITY): Payer: Self-pay

## 2016-10-25 DIAGNOSIS — E669 Obesity, unspecified: Secondary | ICD-10-CM | POA: Insufficient documentation

## 2016-10-25 DIAGNOSIS — O98312 Other infections with a predominantly sexual mode of transmission complicating pregnancy, second trimester: Secondary | ICD-10-CM | POA: Insufficient documentation

## 2016-10-25 DIAGNOSIS — Z3A23 23 weeks gestation of pregnancy: Secondary | ICD-10-CM | POA: Insufficient documentation

## 2016-10-25 DIAGNOSIS — O09892 Supervision of other high risk pregnancies, second trimester: Secondary | ICD-10-CM | POA: Diagnosis not present

## 2016-10-25 DIAGNOSIS — O09212 Supervision of pregnancy with history of pre-term labor, second trimester: Secondary | ICD-10-CM | POA: Insufficient documentation

## 2016-10-25 DIAGNOSIS — O0990 Supervision of high risk pregnancy, unspecified, unspecified trimester: Secondary | ICD-10-CM

## 2016-10-25 DIAGNOSIS — Z363 Encounter for antenatal screening for malformations: Secondary | ICD-10-CM | POA: Insufficient documentation

## 2016-10-25 DIAGNOSIS — O99212 Obesity complicating pregnancy, second trimester: Secondary | ICD-10-CM | POA: Insufficient documentation

## 2016-10-25 DIAGNOSIS — Z8759 Personal history of other complications of pregnancy, childbirth and the puerperium: Secondary | ICD-10-CM

## 2016-10-25 DIAGNOSIS — O09219 Supervision of pregnancy with history of pre-term labor, unspecified trimester: Secondary | ICD-10-CM

## 2016-10-25 DIAGNOSIS — O09899 Supervision of other high risk pregnancies, unspecified trimester: Secondary | ICD-10-CM

## 2016-10-25 DIAGNOSIS — Z3686 Encounter for antenatal screening for cervical length: Secondary | ICD-10-CM | POA: Insufficient documentation

## 2016-10-25 DIAGNOSIS — A630 Anogenital (venereal) warts: Secondary | ICD-10-CM

## 2016-10-25 LAB — CULTURE, OB URINE

## 2016-10-25 LAB — URINE CULTURE, OB REFLEX: ORGANISM ID, BACTERIA: NO GROWTH

## 2016-10-26 LAB — CYTOLOGY - PAP
Bacterial vaginitis: NEGATIVE
CANDIDA VAGINITIS: NEGATIVE
Chlamydia: NEGATIVE
Diagnosis: NEGATIVE
Neisseria Gonorrhea: NEGATIVE
TRICH (WINDOWPATH): NEGATIVE

## 2016-10-29 DIAGNOSIS — O09299 Supervision of pregnancy with other poor reproductive or obstetric history, unspecified trimester: Secondary | ICD-10-CM | POA: Insufficient documentation

## 2016-10-30 LAB — OBSTETRIC PANEL, INCLUDING HIV
Antibody Screen: NEGATIVE
BASOS ABS: 0 10*3/uL (ref 0.0–0.2)
Basos: 0 %
EOS (ABSOLUTE): 0.2 10*3/uL (ref 0.0–0.4)
Eos: 3 %
HEMOGLOBIN: 11.4 g/dL (ref 11.1–15.9)
HIV Screen 4th Generation wRfx: NONREACTIVE
Hematocrit: 35.3 % (ref 34.0–46.6)
Hepatitis B Surface Ag: NEGATIVE
IMMATURE GRANULOCYTES: 0 %
Immature Grans (Abs): 0 10*3/uL (ref 0.0–0.1)
LYMPHS ABS: 1.5 10*3/uL (ref 0.7–3.1)
Lymphs: 23 %
MCH: 28.4 pg (ref 26.6–33.0)
MCHC: 32.3 g/dL (ref 31.5–35.7)
MCV: 88 fL (ref 79–97)
MONOCYTES: 8 %
Monocytes Absolute: 0.5 10*3/uL (ref 0.1–0.9)
Neutrophils Absolute: 4.4 10*3/uL (ref 1.4–7.0)
Neutrophils: 66 %
PLATELETS: 272 10*3/uL (ref 150–379)
RBC: 4.01 x10E6/uL (ref 3.77–5.28)
RDW: 14.2 % (ref 12.3–15.4)
RPR: NONREACTIVE
Rh Factor: POSITIVE
Rubella Antibodies, IGG: 4.31 index (ref >0.99)
WBC: 6.7 10*3/uL (ref 3.4–10.8)

## 2016-10-30 LAB — HEMOGLOBINOPATHY EVALUATION
Ferritin: 27 ng/mL (ref 15–150)
HGB A: 97.7 % (ref 96.4–98.8)
HGB F QUANT: 0 % (ref 0.0–2.0)
HGB SOLUBILITY: NEGATIVE
Hgb A2 Quant: 2.3 % (ref 1.8–3.2)
Hgb C: 0 %
Hgb S: 0 %
Hgb Variant: 0 %

## 2016-10-30 LAB — CYSTIC FIBROSIS GENE TEST

## 2016-10-30 LAB — HEMOGLOBIN A1C
ESTIMATED AVERAGE GLUCOSE: 94 mg/dL
HEMOGLOBIN A1C: 4.9 % (ref 4.8–5.6)

## 2016-11-13 ENCOUNTER — Telehealth: Payer: Self-pay | Admitting: Family Medicine

## 2016-11-13 ENCOUNTER — Encounter: Payer: Medicaid Other | Admitting: Family Medicine

## 2016-11-13 NOTE — Telephone Encounter (Signed)
Called patient about missing her appointment. Left a message for her to call about her scheduled appointment.

## 2016-11-14 ENCOUNTER — Encounter: Payer: Medicaid Other | Admitting: Obstetrics & Gynecology

## 2016-11-23 ENCOUNTER — Encounter: Payer: Self-pay | Admitting: *Deleted

## 2016-11-29 ENCOUNTER — Telehealth: Payer: Self-pay | Admitting: General Practice

## 2016-11-29 NOTE — Telephone Encounter (Signed)
Patient called and left message stating she has been having pain in the bottom of her stomach & lower back when she moves around. Patient states she has also had swelling & stiffness and isn't sure when her next appt is. Called patient stating I am returning her phone call. Patient states she has been feeling lots of pressure lately in her pelvis and back. Patient states she has to pee every 5 minutes and has been having back pain. Patient states she has also been having swelling and occasional stiffness. Discussed with patient normal pregnancy discomforts versus concerning severe pain and contractions. Patient verbalized understanding and states she hasn't been having severe pain or anything like that. Told patient she needs a follow up appt as she has recently missed her appt. Patient verbalized understanding and will await phone call from front office staff.

## 2016-12-10 ENCOUNTER — Encounter: Payer: Medicaid Other | Admitting: Advanced Practice Midwife

## 2016-12-14 ENCOUNTER — Inpatient Hospital Stay (HOSPITAL_COMMUNITY)
Admission: AD | Admit: 2016-12-14 | Discharge: 2016-12-14 | Disposition: A | Discharge status: Home / Self Care | Payer: Medicaid Other | Source: Ambulatory Visit | Attending: Obstetrics & Gynecology | Admitting: Obstetrics & Gynecology

## 2016-12-14 ENCOUNTER — Encounter (HOSPITAL_COMMUNITY): Payer: Self-pay

## 2016-12-14 ENCOUNTER — Other Ambulatory Visit: Payer: Self-pay

## 2016-12-14 DIAGNOSIS — A630 Anogenital (venereal) warts: Secondary | ICD-10-CM

## 2016-12-14 DIAGNOSIS — O09219 Supervision of pregnancy with history of pre-term labor, unspecified trimester: Secondary | ICD-10-CM

## 2016-12-14 DIAGNOSIS — Z3A30 30 weeks gestation of pregnancy: Secondary | ICD-10-CM | POA: Diagnosis not present

## 2016-12-14 DIAGNOSIS — O09899 Supervision of other high risk pregnancies, unspecified trimester: Secondary | ICD-10-CM

## 2016-12-14 DIAGNOSIS — O4703 False labor before 37 completed weeks of gestation, third trimester: Secondary | ICD-10-CM

## 2016-12-14 DIAGNOSIS — O0990 Supervision of high risk pregnancy, unspecified, unspecified trimester: Secondary | ICD-10-CM

## 2016-12-14 DIAGNOSIS — O98312 Other infections with a predominantly sexual mode of transmission complicating pregnancy, second trimester: Secondary | ICD-10-CM

## 2016-12-14 DIAGNOSIS — Z8759 Personal history of other complications of pregnancy, childbirth and the puerperium: Secondary | ICD-10-CM

## 2016-12-14 LAB — URINALYSIS, ROUTINE W REFLEX MICROSCOPIC
BILIRUBIN URINE: NEGATIVE
GLUCOSE, UA: NEGATIVE mg/dL
Hgb urine dipstick: NEGATIVE
KETONES UR: 80 mg/dL — AB
LEUKOCYTES UA: NEGATIVE
Nitrite: NEGATIVE
PH: 6 (ref 5.0–8.0)
Protein, ur: NEGATIVE mg/dL
Specific Gravity, Urine: 1.021 (ref 1.005–1.030)

## 2016-12-14 LAB — FETAL FIBRONECTIN: Fetal Fibronectin: NEGATIVE

## 2016-12-14 NOTE — MAU Note (Signed)
Having some ctxs. Every 10-1415mins since 1300. Denies LOF or bleeding today. Spotting few days ago.  Has not been to doctor regularly. Has had some headaches and swelling last few wks. Hx PTD at 33wks

## 2016-12-14 NOTE — MAU Provider Note (Signed)
History     CSN: [id number]  Arrival date and time: 12/14/16 [id number]   First Provider Initiated Contact with Patient 12/14/16 2008      Chief Complaint  Patient presents with  . Contractions   HPI  Ms. Joy Nailiffany A Rivera is a 26 y.o. [id number] at 3228w6d gestation presenting to MAU with complaints of UC's every 10-15 mins since 1300 today. She reports H/As and swelling. She reports spotting a few days ago, but none now. She denies VB or LOF now. She has not been to the doctor on a regular basis. This pregnancy is high risk d/t h/o PTD @ 33 wks.  Past Medical History:  Diagnosis Date  . Abnormal Pap smear   . Anxiety   . Depression     Past Surgical History:  Procedure Laterality Date  . THERAPEUTIC ABORTION      Family History  Problem Relation Age of Onset  . Hypertension Father   . Heart disease Father   . Cancer Maternal Aunt   . Cancer Maternal Uncle   . Hypertension Paternal Grandmother   . Cancer Maternal Grandmother   . Cancer Maternal Grandfather     Social History   Tobacco Use  . Smoking status: Never Smoker  . Smokeless tobacco: Never Used  Substance Use Topics  . Alcohol use: No  . Drug use: No    Allergies: No Known Allergies  Medications Prior to Admission  Medication Sig Dispense Refill Last Dose  . Prenatal Vit-Fe Fumarate-FA (PRENATAL COMPLETE) 14-0.4 MG TABS Take 1 tablet by mouth daily. (Patient not taking: Reported on 10/23/2016) 60 each 0 Not Taking    Review of Systems  Constitutional: Negative.   HENT: Negative.   Eyes: Negative.   Respiratory: Negative.   Cardiovascular: Positive for leg swelling.  Gastrointestinal: Positive for abdominal pain (lower cramping).  Endocrine: Negative.   Musculoskeletal: Negative.   Skin: Negative.   Allergic/Immunologic: Negative.   Neurological: Positive for headaches.  Hematological: Negative.   Psychiatric/Behavioral: Negative.    Physical Exam   Blood pressure 122/69, pulse 99, resp. rate 18,  height 5\' 4"  (1.626 m), weight 184 lb (83.5 kg), last menstrual period 05/15/2016, unknown if currently breastfeeding.  Physical Exam  Nursing note and vitals reviewed. Constitutional: She is oriented to person, place, and time. She appears well-developed and well-nourished.  HENT:  Head: Normocephalic.  Eyes: Pupils are equal, round, and reactive to light.  Neck: Normal range of motion.  Cardiovascular: Normal rate, regular rhythm and normal heart sounds.  Respiratory: Effort normal and breath sounds normal.  GI: Soft. Bowel sounds are normal.  Genitourinary:  Genitourinary Comments: fFN obtained prior to VE Dilation: Closed Cervical Position: Posterior Exam by:: R.Vonn Sliger,CNM   Musculoskeletal: Normal range of motion.  Neurological: She is alert and oriented to person, place, and time.  Skin: Skin is warm and dry.  Psychiatric: She has a normal mood and affect. Her behavior is normal. Judgment and thought content normal.    MAU Course  Procedures  MDM NST - FHR: 145 bpm / moderate variability / accels present / decels absent / TOCO: none  Results for orders placed or performed during the hospital encounter of 12/14/16 (from the past 24 hour(s))  Urinalysis, Routine w reflex microscopic     Status: Abnormal   Collection Time: 12/14/16  7:40 PM  Result Value Ref Range   Color, Urine YELLOW YELLOW   APPearance CLEAR CLEAR   Specific Gravity, Urine 1.021 1.005 - 1.030  pH 6.0 5.0 - 8.0   Glucose, UA NEGATIVE NEGATIVE mg/dL   Hgb urine dipstick NEGATIVE NEGATIVE   Bilirubin Urine NEGATIVE NEGATIVE   Ketones, ur 80 (A) NEGATIVE mg/dL   Protein, ur NEGATIVE NEGATIVE mg/dL   Nitrite NEGATIVE NEGATIVE   Leukocytes, UA NEGATIVE NEGATIVE  Fetal fibronectin     Status: None   Collection Time: 12/14/16  8:13 PM  Result Value Ref Range   Fetal Fibronectin NEGATIVE NEGATIVE    Assessment and Plan  Preterm uterine contractions in third trimester, antepartum - Stay well-hydrated  with 6-8 water bottles daily - Return to MAU with worsening condition  Discharge home Patient verbalized an understanding of the plan of care and agrees.   Raelyn Moraolitta Hillery Bhalla, MSN, CNM 12/14/2016, 8:09 PM

## 2016-12-25 ENCOUNTER — Ambulatory Visit: Payer: Medicaid Other

## 2016-12-25 ENCOUNTER — Encounter: Payer: Self-pay | Admitting: *Deleted

## 2016-12-25 NOTE — Progress Notes (Signed)
Patient has mail order Nexplanon device in office at [facility]  Should she decide on Nexplanon for her birth control.

## 2016-12-26 ENCOUNTER — Ambulatory Visit: Payer: Medicaid Other

## 2017-01-08 ENCOUNTER — Encounter: Payer: Medicaid Other | Admitting: Family Medicine

## 2017-01-10 ENCOUNTER — Ambulatory Visit (INDEPENDENT_AMBULATORY_CARE_PROVIDER_SITE_OTHER): Payer: Medicaid Other | Admitting: Obstetrics & Gynecology

## 2017-01-10 VITALS — BP 123/79 | HR 79 | Wt 197.1 lb

## 2017-01-10 DIAGNOSIS — O0993 Supervision of high risk pregnancy, unspecified, third trimester: Secondary | ICD-10-CM

## 2017-01-10 NOTE — Progress Notes (Signed)
Headache  Swelling in hands Declined flu  Will come back next week for Gtt

## 2017-01-10 NOTE — Patient Instructions (Signed)

## 2017-01-10 NOTE — Progress Notes (Signed)
   PRENATAL VISIT NOTE  Subjective:  Joy Rivera is a 26 y.o. [id number] at 6862w2d being seen today for ongoing prenatal care.  She is currently monitored for the following issues for this high-risk pregnancy and has Genital herpes; Genital warts complicating pregnancy; Supervision of high risk pregnancy, antepartum; History of preterm delivery, currently pregnant; History of placenta abruption; Current singleton pregnancy with history of congenital anomaly in prior child, antepartum; and Preterm uterine contractions in third trimester, antepartum on their problem list.  Patient reports Headache .  Contractions: Irritability.  .  Movement: Present. Denies leaking of fluid.   The following portions of the patient's history were reviewed and updated as appropriate: allergies, current medications, past family history, past medical history, past social history, past surgical history and problem list. Problem list updated.  Objective:   Vitals:   01/10/17 1330  BP: 123/79  Pulse: 79  Weight: 197 lb 1.6 oz (89.4 kg)    Fetal Status: Fetal Heart Rate (bpm): 145 Fundal Height: 35 cm Movement: Present     General:  Alert, oriented and cooperative. Patient is in no acute distress.  Skin: Skin is warm and dry. No rash noted.   Cardiovascular: Normal heart rate noted  Respiratory: Normal respiratory effort, no problems with respiration noted  Abdomen: Soft, gravid, appropriate for gestational age.  Pain/Pressure: Present     Pelvic: Cervical exam performed        Extremities: Normal range of motion.  Edema: Moderate pitting, indentation subsides rapidly  Mental Status:  Normal mood and affect. Normal behavior. Normal judgment and thought content.   Assessment and Plan:  Pregnancy: [id number] at 8462w2d  1. Supervision of high risk pregnancy, antepartum, third trimester Baseline PIH labs today - Glucose Tolerance, 1 Hour - HIV antibody (with reflex) - RPR - CBC - Comprehensive metabolic  panel - Protein / creatinine ratio, urine  Preterm labor symptoms and general obstetric precautions including but not limited to vaginal bleeding, contractions, leaking of fluid and fetal movement were reviewed in detail with the patient. Please refer to After Visit Summary for other counseling recommendations.  Return in about 1 week (around 01/17/2017).   Scheryl DarterJames Aero Drummonds, MD

## 2017-01-11 LAB — PROTEIN / CREATININE RATIO, URINE
CREATININE, UR: 107 mg/dL
Protein, Ur: 16.8 mg/dL
Protein/Creat Ratio: 157 mg/g creat (ref 0–200)

## 2017-01-11 LAB — COMPREHENSIVE METABOLIC PANEL
A/G RATIO: 1.1 — AB (ref 1.2–2.2)
ALBUMIN: 2.7 g/dL — AB (ref 3.5–5.5)
ALK PHOS: 85 IU/L (ref 39–117)
ALT: 6 IU/L (ref 0–32)
AST: 9 IU/L (ref 0–40)
BUN / CREAT RATIO: 12 (ref 9–23)
BUN: 7 mg/dL (ref 6–20)
CHLORIDE: 103 mmol/L (ref 96–106)
CO2: 23 mmol/L (ref 20–29)
Calcium: 8.6 mg/dL — ABNORMAL LOW (ref 8.7–10.2)
Creatinine, Ser: 0.57 mg/dL (ref 0.57–1.00)
GFR calc Af Amer: 149 mL/min/{1.73_m2} (ref >59)
GFR calc non Af Amer: 129 mL/min/{1.73_m2} (ref >59)
GLOBULIN, TOTAL: 2.5 g/dL (ref 1.5–4.5)
Glucose: 87 mg/dL (ref 65–99)
POTASSIUM: 4.1 mmol/L (ref 3.5–5.2)
SODIUM: 139 mmol/L (ref 134–144)
Total Protein: 5.2 g/dL — ABNORMAL LOW (ref 6.0–8.5)

## 2017-01-11 LAB — CBC
HEMATOCRIT: 29.9 % — AB (ref 34.0–46.6)
HEMOGLOBIN: 9.6 g/dL — AB (ref 11.1–15.9)
MCH: 26.4 pg — AB (ref 26.6–33.0)
MCHC: 32.1 g/dL (ref 31.5–35.7)
MCV: 82 fL (ref 79–97)
Platelets: 197 10*3/uL (ref 150–379)
RBC: 3.64 x10E6/uL — ABNORMAL LOW (ref 3.77–5.28)
RDW: 13.2 % (ref 12.3–15.4)
WBC: 4.4 10*3/uL (ref 3.4–10.8)

## 2017-01-11 LAB — HIV ANTIBODY (ROUTINE TESTING W REFLEX): HIV Screen 4th Generation wRfx: NONREACTIVE

## 2017-01-11 LAB — GLUCOSE TOLERANCE, 1 HOUR: Glucose, 1Hr PP: 84 mg/dL (ref 65–199)

## 2017-01-11 LAB — RPR: RPR Ser Ql: NONREACTIVE

## 2017-01-12 ENCOUNTER — Inpatient Hospital Stay (HOSPITAL_COMMUNITY)
Admission: AD | Admit: 2017-01-12 | Discharge: 2017-01-12 | Disposition: A | Discharge status: Home / Self Care | Payer: Medicaid Other | Source: Ambulatory Visit | Attending: Obstetrics and Gynecology | Admitting: Obstetrics and Gynecology

## 2017-01-12 ENCOUNTER — Encounter (HOSPITAL_COMMUNITY): Payer: Self-pay

## 2017-01-12 DIAGNOSIS — O09219 Supervision of pregnancy with history of pre-term labor, unspecified trimester: Secondary | ICD-10-CM

## 2017-01-12 DIAGNOSIS — Z8759 Personal history of other complications of pregnancy, childbirth and the puerperium: Secondary | ICD-10-CM

## 2017-01-12 DIAGNOSIS — E860 Dehydration: Secondary | ICD-10-CM | POA: Insufficient documentation

## 2017-01-12 DIAGNOSIS — O4703 False labor before 37 completed weeks of gestation, third trimester: Secondary | ICD-10-CM | POA: Diagnosis not present

## 2017-01-12 DIAGNOSIS — O26893 Other specified pregnancy related conditions, third trimester: Secondary | ICD-10-CM

## 2017-01-12 DIAGNOSIS — F5089 Other specified eating disorder: Secondary | ICD-10-CM

## 2017-01-12 DIAGNOSIS — O99613 Diseases of the digestive system complicating pregnancy, third trimester: Secondary | ICD-10-CM | POA: Insufficient documentation

## 2017-01-12 DIAGNOSIS — O99283 Endocrine, nutritional and metabolic diseases complicating pregnancy, third trimester: Secondary | ICD-10-CM | POA: Diagnosis not present

## 2017-01-12 DIAGNOSIS — N949 Unspecified condition associated with female genital organs and menstrual cycle: Secondary | ICD-10-CM

## 2017-01-12 DIAGNOSIS — Z3A34 34 weeks gestation of pregnancy: Secondary | ICD-10-CM | POA: Insufficient documentation

## 2017-01-12 DIAGNOSIS — O09899 Supervision of other high risk pregnancies, unspecified trimester: Secondary | ICD-10-CM

## 2017-01-12 DIAGNOSIS — R102 Pelvic and perineal pain: Secondary | ICD-10-CM | POA: Diagnosis present

## 2017-01-12 DIAGNOSIS — K219 Gastro-esophageal reflux disease without esophagitis: Secondary | ICD-10-CM | POA: Diagnosis not present

## 2017-01-12 DIAGNOSIS — O0990 Supervision of high risk pregnancy, unspecified, unspecified trimester: Secondary | ICD-10-CM

## 2017-01-12 LAB — URINALYSIS, ROUTINE W REFLEX MICROSCOPIC
Bilirubin Urine: NEGATIVE
Glucose, UA: NEGATIVE mg/dL
Hgb urine dipstick: NEGATIVE
Ketones, ur: NEGATIVE mg/dL
Leukocytes, UA: NEGATIVE
Nitrite: NEGATIVE
Protein, ur: NEGATIVE mg/dL
Specific Gravity, Urine: 1.019 (ref 1.005–1.030)
pH: 6 (ref 5.0–8.0)

## 2017-01-12 MED ORDER — FAMOTIDINE 20 MG PO TABS
20.0000 mg | ORAL_TABLET | Freq: Once | ORAL | Status: AC
  Administered 2017-01-12: 20 mg via ORAL
  Filled 2017-01-12: qty 1

## 2017-01-12 MED ORDER — RANITIDINE HCL 150 MG PO TABS: 150.0000 mg | ORAL_TABLET | Freq: Two times a day (BID) | ORAL | 3 refills | Status: DC

## 2017-01-12 NOTE — MAU Note (Signed)
Pt c/o a lot of cervical mucous and rectal/vaginal pressure, back pain and upper thigh pain. Thinks she is possible having contractions. Was seen by doctor on Thursday and was told she was 2cm. Pt states she has had a few episodes of loose stools. Reports swelling in ankles and feet. Told doctor-labs were ran and were okay.

## 2017-01-12 NOTE — MAU Provider Note (Signed)
Chief Complaint:  Back Pain and Vaginal Discharge   First Provider Initiated Contact with Patient 01/12/17 2153     HPI: Joy Rivera is a 26 y.o. [id number] at 5834w4dwho presents to maternity admissions reporting pelvic pressure and swelling of feet.  Having contractions.  Worried she is in labor  Cervix was 2cm last week.  Also wants something to help with "GERD, you know sulfur burps".  Has not tried anything.   Does not drink water at all. Eats ice all day. She reports good fetal movement, denies LOF, vaginal bleeding, vaginal itching/burning, urinary symptoms, h/a, dizziness, n/v, diarrhea, constipation or fever/chills.  She denies headache, visual changes or RUQ abdominal pain.  Back Pain  This is a new problem. The current episode started in the past 7 days. The problem occurs constantly. The problem is unchanged. The pain is present in the lumbar spine. The quality of the pain is described as aching. The pain does not radiate. Associated symptoms include abdominal pain. Pertinent negatives include no dysuria, fever or weakness.  Vaginal Discharge  The patient's primary symptoms include vaginal discharge. The patient's pertinent negatives include no genital itching, genital lesions, genital odor or vaginal bleeding. The problem has been unchanged. The problem affects both sides. She is pregnant. Associated symptoms include abdominal pain and back pain. Pertinent negatives include no dysuria or fever. The vaginal discharge was clear. There has been no bleeding. Nothing aggravates the symptoms. She has tried nothing for the symptoms.    RN Note: Pt c/o a lot of cervical mucous and rectal/vaginal pressure, back pain and upper thigh pain. Thinks she is possible having contractions. Was seen by doctor on Thursday and was told she was 2cm. Pt states she has had a few episodes of loose stools. Reports swelling in ankles and feet. Told doctor-labs were ran and were okay.     Past Medical  History: Past Medical History:  Diagnosis Date  . Abnormal Pap smear   . Anxiety   . Depression     Past obstetric history: OB History  Gravida Para Term Preterm AB Living  6 2 1 1 3 2   SAB TAB Ectopic Multiple Live Births  0 2 1 0 2    # Outcome Date GA Lbr Len/2nd Weight Sex Delivery Anes PTL Lv  6 Current           5 Preterm 03/04/16 6419w1d 12:35 / 00:06 4 lb 8.3 oz (2.05 kg) M Vag-Spont EPI Y LIV     Complications: Abruptio Placenta     Birth Comments: see NICU note  4 Term 11/12/12 7652w6d 21:40 / 01:08 7 lb 8.1 oz (3.405 kg) M Vag-Spont EPI  LIV  3 Ectopic           2 TAB           1 TAB               Past Surgical History: Past Surgical History:  Procedure Laterality Date  . THERAPEUTIC ABORTION      Family History: Family History  Problem Relation Age of Onset  . Hypertension Father   . Heart disease Father   . Cancer Maternal Aunt   . Cancer Maternal Uncle   . Hypertension Paternal Grandmother   . Cancer Maternal Grandmother   . Cancer Maternal Grandfather     Social History: Social History   Tobacco Use  . Smoking status: Never Smoker  . Smokeless tobacco: Never Used  Substance Use Topics  .  Alcohol use: No  . Drug use: No    Allergies: No Known Allergies  Meds:  Medications Prior to Admission  Medication Sig Dispense Refill Last Dose  . Prenatal Vit-Fe Fumarate-FA (PRENATAL COMPLETE) 14-0.4 MG TABS Take 1 tablet by mouth daily. 60 each 0 Taking    I have reviewed patient's Past Medical Hx, Surgical Hx, Family Hx, Social Hx, medications and allergies.   ROS:  Review of Systems  Constitutional: Negative for fever.  Gastrointestinal: Positive for abdominal pain.  Genitourinary: Positive for vaginal discharge. Negative for dysuria.  Musculoskeletal: Positive for back pain.  Neurological: Negative for weakness.   Other systems negative  Physical Exam   Patient Vitals for the past 24 hrs:  BP Temp Temp src Pulse Resp  01/12/17 2138  128/78 98.6 F (37 C) Oral 78 18   Constitutional: Well-developed, well-nourished female in no acute distress.  Cardiovascular: normal rate and rhythm Respiratory: normal effort, clear to auscultation bilaterally GI: Abd soft, non-tender, gravid appropriate for gestational age.   No rebound or guarding. MS: Extremities nontender, no edema, normal ROM Neurologic: Alert and oriented x 4.  GU: Neg CVAT.  PELVIC EXAM: Dilation: 2 Effacement (%): 50 Station: [place], -3 Exam by:: Wynelle BourgeoisMarie Kathleena Freeman, CNM  FHT:  Baseline 140 , moderate variability, accelerations present, no decelerations Contractions:  Irregular     Labs: Results for orders placed or performed during the hospital encounter of 01/12/17 (from the past 24 hour(s))  Urinalysis, Routine w reflex microscopic     Status: None   Collection Time: 01/12/17  9:30 PM  Result Value Ref Range   Color, Urine YELLOW YELLOW   APPearance CLEAR CLEAR   Specific Gravity, Urine 1.019 1.005 - 1.030   pH 6.0 5.0 - 8.0   Glucose, UA NEGATIVE NEGATIVE mg/dL   Hgb urine dipstick NEGATIVE NEGATIVE   Bilirubin Urine NEGATIVE NEGATIVE   Ketones, ur NEGATIVE NEGATIVE mg/dL   Protein, ur NEGATIVE NEGATIVE mg/dL   Nitrite NEGATIVE NEGATIVE   Leukocytes, UA NEGATIVE NEGATIVE   A/Positive/-- (09/18 1447)  Imaging:  No results found.  MAU Course/MDM: I have ordered labs and reviewed results.  NST reviewed, reactive, category I Given Pepcid for GERD.  Will Rx Zantac  Treatments in MAU included Pepcid.    Assessment: 1. Preterm uterine contractions in third trimester, antepartum   2. Supervision of high risk pregnancy, antepartum   3. History of preterm delivery, currently pregnant   4. History of placenta abruption   5.      GERD 6.      Dehydration due to insufficient water intake and PICA/ice  Plan: Discharge home Discussed water intake and reasons it is important Rx Zantac for GERD Preterm Labor precautions and fetal kick  counts Follow up in Office for prenatal visits and recheck  Encouraged to return here or to other Urgent Care/ED if she develops worsening of symptoms, increase in pain, fever, or other concerning symptoms.   Pt stable at time of discharge.  Wynelle BourgeoisMarie Amous Crewe CNM, MSN Certified Nurse-Midwife 01/12/2017 9:53 PM

## 2017-01-12 NOTE — Discharge Instructions (Signed)
Heartburn During Pregnancy Heartburn is pain or discomfort in the throat or chest. It may cause a burning feeling. It happens when stomach acid moves up into the tube that carries food from your mouth to your stomach (esophagus). Heartburn is common during pregnancy. It usually goes away or gets better after giving birth. Follow these instructions at home: Eating and drinking  Do not drink alcohol while you are pregnant.  Figure out which foods and beverages make you feel worse, and avoid them.  Beverages that you may want to avoid include: ? Coffee and tea (with or without caffeine). ? Energy drinks and sports drinks. ? Bubbly (carbonated) drinks or sodas. ? Citrus fruit juices.  Foods that you may want to avoid include: ? Chocolate and cocoa. ? Peppermint and mint flavorings. ? Garlic, onions, and horseradish. ? Spicy and acidic foods. These include peppers, chili powder, curry powder, vinegar, hot sauces, and barbecue sauce. ? Citrus fruits, such as oranges, lemons, and limes. ? Tomato-based foods, such as red sauce, chili, and salsa. ? Fried and fatty foods, such as donuts, french fries, potato chips, and high-fat dressings. ? High-fat meats, such as hot dogs, cold cuts, sausage, ham, and bacon. ? High-fat dairy items, such as whole milk, butter, and cheese.  Eat small meals often, instead of large meals.  Avoid drinking a lot of liquid with your meals.  Avoid eating meals during the 2-3 hours before you go to bed.  Avoid lying down right after you eat.  Do not exercise right after you eat. Medicines  Take over-the-counter and prescription medicines only as told by your doctor.  Do not take aspirin, ibuprofen, or other NSAIDs unless your doctor tells you to do that.  Your doctor may tell you to avoid medicines that have sodium bicarbonate in them. General instructions  If told, raise the head of your bed about 6 inches (15 cm). You can do this by putting blocks under  the legs. Sleeping with more pillows does not help with heartburn.  Do not use any products that contain nicotine or tobacco, such as cigarettes and e-cigarettes. If you need help quitting, ask your doctor.  Wear loose-fitting clothing.  Try to lower your stress, such as with yoga or meditation. If you need help, ask your doctor.  Stay at a healthy weight. If you are overweight, work with your doctor to safely lose weight.  Keep all follow-up visits as told by your doctor. This is important. Contact a doctor if:  You get new symptoms.  Your symptoms do not get better with treatment.  You have weight loss and you do not know why.  You have trouble swallowing.  You make loud sounds when you breathe (wheeze).  You have a cough that does not go away.  You have heartburn often for more than 2 weeks.  You feel sick to your stomach (nauseous), and this does not get better with treatment.  You are throwing up (vomiting), and this does not get better with treatment.  You have pain in your belly (abdomen). Get help right away if:  You have very bad chest pain that spreads to your arm, neck, or jaw.  You feel sweaty, dizzy, or light-headed.  You have trouble breathing.  You have pain when swallowing.  You throw up and your throw-up looks like blood or coffee grounds.  Your poop (stool) is bloody or black. This information is not intended to replace advice given to you by your health care provider.  Make sure you discuss any questions you have with your health care provider. Document Released: 02/24/2010 Document Revised: 10/10/2015 Document Reviewed: 10/10/2015 Elsevier Interactive Patient Education  2017 [employer] Third Trimester of Pregnancy The third trimester is from week 29 through week 42, months 7 through 9. This trimester is when your unborn baby (fetus) is growing very fast. At the end of the ninth month, the unborn baby is about 20 inches in length. It weighs about  6-10 pounds. Follow these instructions at home:  Avoid all smoking, herbs, and alcohol. Avoid drugs not approved by your doctor.  Do not use any tobacco products, including cigarettes, chewing tobacco, and electronic cigarettes. If you need help quitting, ask your doctor. You may get counseling or other support to help you quit.  Only take medicine as told by your doctor. Some medicines are safe and some are not during pregnancy.  Exercise only as told by your doctor. Stop exercising if you start having cramps.  Eat regular, healthy meals.  Wear a good support bra if your breasts are tender.  Do not use hot tubs, steam rooms, or saunas.  Wear your seat belt when driving.  Avoid raw meat, uncooked cheese, and liter boxes and soil used by cats.  Take your prenatal vitamins.  Take 1500-2000 milligrams of calcium daily starting at the 20th week of pregnancy until you deliver your baby.  Try taking medicine that helps you poop (stool softener) as needed, and if your doctor approves. Eat more fiber by eating fresh fruit, vegetables, and whole grains. Drink enough fluids to keep your pee (urine) clear or pale yellow.  Take warm water baths (sitz baths) to soothe pain or discomfort caused by hemorrhoids. Use hemorrhoid cream if your doctor approves.  If you have puffy, bulging veins (varicose veins), wear support hose. Raise (elevate) your feet for 15 minutes, 3-4 times a day. Limit salt in your diet.  Avoid heavy lifting, wear low heels, and sit up straight.  Rest with your legs raised if you have leg cramps or low back pain.  Visit your dentist if you have not gone during your pregnancy. Use a soft toothbrush to brush your teeth. Be gentle when you floss.  You can have sex (intercourse) unless your doctor tells you not to.  Do not travel far distances unless you must. Only do so with your doctor's approval.  Take prenatal classes.  Practice driving to the hospital.  Pack your  hospital bag.  Prepare the baby's room.  Go to your doctor visits. Get help if:  You are not sure if you are in labor or if your water has broken.  You are dizzy.  You have mild cramps or pressure in your lower belly (abdominal).  You have a nagging pain in your belly area.  You continue to feel sick to your stomach (nauseous), throw up (vomit), or have watery poop (diarrhea).  You have bad smelling fluid coming from your vagina.  You have pain with peeing (urination). Get help right away if:  You have a fever.  You are leaking fluid from your vagina.  You are spotting or bleeding from your vagina.  You have severe belly cramping or pain.  You lose or gain weight rapidly.  You have trouble catching your breath and have chest pain.  You notice sudden or extreme puffiness (swelling) of your face, hands, ankles, feet, or legs.  You have not felt the baby move in over an hour.  You have   severe headaches that do not go away with medicine.  You have vision changes. This information is not intended to replace advice given to you by your health care provider. Make sure you discuss any questions you have with your health care provider. Document Released: 04/18/2009 Document Revised: 06/30/2015 Document Reviewed: 03/25/2012 Elsevier Interactive Patient Education  2017 [employer]

## 2017-01-15 ENCOUNTER — Telehealth: Payer: Self-pay | Admitting: *Deleted

## 2017-01-15 ENCOUNTER — Encounter: Payer: Self-pay | Admitting: *Deleted

## 2017-01-15 NOTE — Telephone Encounter (Signed)
Joy Rivera called 01/11/17 2:04 ( after office closed) and left  A voicemail she is calling back about her bloodwork results from 01/10/17 and also that she is having a lot of swelling in legs/ hands and contractions.

## 2017-01-15 NOTE — Telephone Encounter (Signed)
Patient did come to MAU 01/12/17. Will send mychart message for followup.

## 2017-01-25 ENCOUNTER — Encounter: Payer: Medicaid Other | Admitting: Obstetrics & Gynecology

## 2017-01-26 ENCOUNTER — Other Ambulatory Visit: Payer: Self-pay

## 2017-01-26 ENCOUNTER — Inpatient Hospital Stay (HOSPITAL_COMMUNITY)
Admission: AD | Admit: 2017-01-26 | Discharge: 2017-01-29 | DRG: 807 | Disposition: A | Discharge status: Home / Self Care | Payer: Medicaid Other | Source: Ambulatory Visit | Attending: Obstetrics & Gynecology | Admitting: Obstetrics & Gynecology

## 2017-01-26 ENCOUNTER — Encounter (HOSPITAL_COMMUNITY): Payer: Self-pay | Admitting: *Deleted

## 2017-01-26 DIAGNOSIS — Z8759 Personal history of other complications of pregnancy, childbirth and the puerperium: Secondary | ICD-10-CM

## 2017-01-26 DIAGNOSIS — O1414 Severe pre-eclampsia complicating childbirth: Principal | ICD-10-CM | POA: Diagnosis present

## 2017-01-26 DIAGNOSIS — Z880 Allergy status to penicillin: Secondary | ICD-10-CM

## 2017-01-26 DIAGNOSIS — O09219 Supervision of pregnancy with history of pre-term labor, unspecified trimester: Secondary | ICD-10-CM

## 2017-01-26 DIAGNOSIS — R109 Unspecified abdominal pain: Secondary | ICD-10-CM

## 2017-01-26 DIAGNOSIS — R519 Headache, unspecified: Secondary | ICD-10-CM

## 2017-01-26 DIAGNOSIS — O99824 Streptococcus B carrier state complicating childbirth: Secondary | ICD-10-CM | POA: Diagnosis present

## 2017-01-26 DIAGNOSIS — Z3A36 36 weeks gestation of pregnancy: Secondary | ICD-10-CM

## 2017-01-26 DIAGNOSIS — Z23 Encounter for immunization: Secondary | ICD-10-CM

## 2017-01-26 DIAGNOSIS — O09899 Supervision of other high risk pregnancies, unspecified trimester: Secondary | ICD-10-CM

## 2017-01-26 DIAGNOSIS — O1493 Unspecified pre-eclampsia, third trimester: Secondary | ICD-10-CM

## 2017-01-26 DIAGNOSIS — O4703 False labor before 37 completed weeks of gestation, third trimester: Secondary | ICD-10-CM

## 2017-01-26 DIAGNOSIS — O169 Unspecified maternal hypertension, unspecified trimester: Secondary | ICD-10-CM

## 2017-01-26 DIAGNOSIS — O134 Gestational [pregnancy-induced] hypertension without significant proteinuria, complicating childbirth: Secondary | ICD-10-CM | POA: Diagnosis not present

## 2017-01-26 DIAGNOSIS — O0990 Supervision of high risk pregnancy, unspecified, unspecified trimester: Secondary | ICD-10-CM

## 2017-01-26 DIAGNOSIS — O26893 Other specified pregnancy related conditions, third trimester: Secondary | ICD-10-CM

## 2017-01-26 DIAGNOSIS — R51 Headache: Secondary | ICD-10-CM

## 2017-01-26 DIAGNOSIS — O1203 Gestational edema, third trimester: Secondary | ICD-10-CM

## 2017-01-26 LAB — COMPREHENSIVE METABOLIC PANEL
ALT: 11 U/L — ABNORMAL LOW (ref 14–54)
ANION GAP: 8 (ref 5–15)
AST: 16 U/L (ref 15–41)
Albumin: 2.4 g/dL — ABNORMAL LOW (ref 3.5–5.0)
Alkaline Phosphatase: 120 U/L (ref 38–126)
BUN: 13 mg/dL (ref 6–20)
CALCIUM: 8.5 mg/dL — AB (ref 8.9–10.3)
CHLORIDE: 107 mmol/L (ref 101–111)
CO2: 21 mmol/L — AB (ref 22–32)
Creatinine, Ser: 0.77 mg/dL (ref 0.44–1.00)
GFR calc non Af Amer: >60 mL/min (ref >60)
Glucose, Bld: 82 mg/dL (ref 65–99)
POTASSIUM: 4.2 mmol/L (ref 3.5–5.1)
SODIUM: 136 mmol/L (ref 135–145)
Total Bilirubin: 0.5 mg/dL (ref 0.3–1.2)
Total Protein: 6 g/dL — ABNORMAL LOW (ref 6.5–8.1)

## 2017-01-26 LAB — CBC WITH DIFFERENTIAL/PLATELET
Basophils Absolute: 0 10*3/uL (ref 0.0–0.1)
Basophils Relative: 0 %
EOS ABS: 0.1 10*3/uL (ref 0.0–0.7)
EOS PCT: 2 %
HCT: 30.7 % — ABNORMAL LOW (ref 36.0–46.0)
Hemoglobin: 9.9 g/dL — ABNORMAL LOW (ref 12.0–15.0)
LYMPHS ABS: 1.7 10*3/uL (ref 0.7–4.0)
Lymphocytes Relative: 32 %
MCH: 25.9 pg — AB (ref 26.0–34.0)
MCHC: 32.2 g/dL (ref 30.0–36.0)
MCV: 80.4 fL (ref 78.0–100.0)
MONOS PCT: 6 %
Monocytes Absolute: 0.3 10*3/uL (ref 0.1–1.0)
Neutro Abs: 3.2 10*3/uL (ref 1.7–7.7)
Neutrophils Relative %: 60 %
PLATELETS: 137 10*3/uL — AB (ref 150–400)
RBC: 3.82 MIL/uL — ABNORMAL LOW (ref 3.87–5.11)
RDW: 13.1 % (ref 11.5–15.5)
WBC: 5.4 10*3/uL (ref 4.0–10.5)

## 2017-01-26 LAB — OB RESULTS CONSOLE GBS: STREP GROUP B AG: POSITIVE

## 2017-01-26 LAB — URINALYSIS, ROUTINE W REFLEX MICROSCOPIC
BACTERIA UA: NONE SEEN
BILIRUBIN URINE: NEGATIVE
Glucose, UA: NEGATIVE mg/dL
HGB URINE DIPSTICK: NEGATIVE
KETONES UR: NEGATIVE mg/dL
LEUKOCYTES UA: NEGATIVE
NITRITE: NEGATIVE
PH: 6 (ref 5.0–8.0)
Protein, ur: 100 mg/dL — AB
SPECIFIC GRAVITY, URINE: 1.013 (ref 1.005–1.030)

## 2017-01-26 LAB — GROUP B STREP BY PCR: GROUP B STREP BY PCR: POSITIVE — AB

## 2017-01-26 LAB — PROTEIN / CREATININE RATIO, URINE
Creatinine, Urine: 112 mg/dL
PROTEIN CREATININE RATIO: 0.91 mg/mg{creat} — AB (ref 0.00–0.15)
TOTAL PROTEIN, URINE: 102 mg/dL

## 2017-01-26 LAB — TYPE AND SCREEN
ABO/RH(D): A POS
Antibody Screen: NEGATIVE

## 2017-01-26 MED ORDER — TERBUTALINE SULFATE 1 MG/ML IJ SOLN: 0.2500 mg | Freq: Once | INTRAMUSCULAR | Status: DC | PRN

## 2017-01-26 MED ORDER — OXYCODONE-ACETAMINOPHEN 5-325 MG PO TABS: 2.0000 | ORAL_TABLET | ORAL | Status: DC | PRN

## 2017-01-26 MED ORDER — ONDANSETRON HCL 4 MG/2ML IJ SOLN
4.0000 mg | Freq: Four times a day (QID) | INTRAMUSCULAR | Status: DC | PRN
  Administered 2017-01-26: 4 mg via INTRAVENOUS
  Filled 2017-01-26: qty 2

## 2017-01-26 MED ORDER — ACETAMINOPHEN 325 MG PO TABS: 650.0000 mg | ORAL_TABLET | ORAL | Status: DC | PRN

## 2017-01-26 MED ORDER — MAGNESIUM SULFATE 40 G IN LACTATED RINGERS - SIMPLE
2.0000 g/h | INTRAVENOUS | Status: DC
  Filled 2017-01-26: qty 500

## 2017-01-26 MED ORDER — OXYTOCIN 40 UNITS IN LACTATED RINGERS INFUSION - SIMPLE MED
1.0000 m[IU]/min | INTRAVENOUS | Status: DC
  Administered 2017-01-26: 2 m[IU]/min via INTRAVENOUS
  Filled 2017-01-26: qty 1000

## 2017-01-26 MED ORDER — OXYTOCIN 40 UNITS IN LACTATED RINGERS INFUSION - SIMPLE MED
2.5000 [IU]/h | INTRAVENOUS | Status: DC
  Administered 2017-01-27: 2.5 [IU]/h via INTRAVENOUS

## 2017-01-26 MED ORDER — MISOPROSTOL 50MCG HALF TABLET
50.0000 ug | ORAL_TABLET | ORAL | Status: DC | PRN
  Administered 2017-01-26: 50 ug via ORAL
  Filled 2017-01-26 (×2): qty 1

## 2017-01-26 MED ORDER — SOD CITRATE-CITRIC ACID 500-334 MG/5ML PO SOLN: 30.0000 mL | ORAL | Status: DC | PRN

## 2017-01-26 MED ORDER — BETAMETHASONE SOD PHOS & ACET 6 (3-3) MG/ML IJ SUSP
12.0000 mg | INTRAMUSCULAR | Status: DC
  Administered 2017-01-26: 12 mg via INTRAMUSCULAR
  Filled 2017-01-26 (×2): qty 2

## 2017-01-26 MED ORDER — VALACYCLOVIR HCL 500 MG PO TABS
500.0000 mg | ORAL_TABLET | Freq: Every day | ORAL | Status: DC
  Administered 2017-01-26: 500 mg via ORAL
  Filled 2017-01-26 (×2): qty 1

## 2017-01-26 MED ORDER — LACTATED RINGERS IV SOLN
500.0000 mL | INTRAVENOUS | Status: DC | PRN
  Administered 2017-01-27: 500 mL via INTRAVENOUS

## 2017-01-26 MED ORDER — HYDRALAZINE HCL 20 MG/ML IJ SOLN: 10.0000 mg | Freq: Once | INTRAMUSCULAR | Status: DC | PRN

## 2017-01-26 MED ORDER — MAGNESIUM SULFATE BOLUS VIA INFUSION
6.0000 g | Freq: Once | INTRAVENOUS | Status: AC
  Administered 2017-01-26 (×2): 6 g via INTRAVENOUS
  Filled 2017-01-26: qty 500

## 2017-01-26 MED ORDER — OXYCODONE-ACETAMINOPHEN 5-325 MG PO TABS: 1.0000 | ORAL_TABLET | ORAL | Status: DC | PRN

## 2017-01-26 MED ORDER — PENICILLIN G POTASSIUM 5000000 UNITS IJ SOLR
5.0000 10*6.[IU] | Freq: Once | INTRAVENOUS | Status: AC
  Administered 2017-01-26: 5 10*6.[IU] via INTRAVENOUS
  Filled 2017-01-26: qty 5

## 2017-01-26 MED ORDER — BUTALBITAL-APAP-CAFFEINE 50-325-40 MG PO TABS
1.0000 | ORAL_TABLET | Freq: Once | ORAL | Status: AC
  Administered 2017-01-26: 1 via ORAL
  Filled 2017-01-26: qty 1

## 2017-01-26 MED ORDER — OXYTOCIN BOLUS FROM INFUSION
500.0000 mL | Freq: Once | INTRAVENOUS | Status: AC
  Administered 2017-01-27: 500 mL via INTRAVENOUS

## 2017-01-26 MED ORDER — OXYCODONE HCL 5 MG PO TABS
5.0000 mg | ORAL_TABLET | ORAL | Status: DC | PRN
  Administered 2017-01-26: 5 mg via ORAL
  Filled 2017-01-26: qty 1

## 2017-01-26 MED ORDER — LIDOCAINE HCL (PF) 1 % IJ SOLN
30.0000 mL | INTRAMUSCULAR | Status: DC | PRN
  Filled 2017-01-26: qty 30

## 2017-01-26 MED ORDER — LABETALOL HCL 5 MG/ML IV SOLN
20.0000 mg | INTRAVENOUS | Status: DC | PRN
  Administered 2017-01-27: 20 mg via INTRAVENOUS
  Administered 2017-01-27: 40 mg via INTRAVENOUS
  Filled 2017-01-26: qty 4
  Filled 2017-01-26: qty 8

## 2017-01-26 MED ORDER — LACTATED RINGERS IV SOLN
INTRAVENOUS | Status: DC
  Administered 2017-01-26 – 2017-01-27 (×3): via INTRAVENOUS

## 2017-01-26 MED ORDER — PENICILLIN G POT IN DEXTROSE 60000 UNIT/ML IV SOLN
3.0000 10*6.[IU] | INTRAVENOUS | Status: DC
  Administered 2017-01-26 – 2017-01-27 (×2): 3 10*6.[IU] via INTRAVENOUS
  Filled 2017-01-26 (×5): qty 50

## 2017-01-26 MED ORDER — FLEET ENEMA 7-19 GM/118ML RE ENEM: 1.0000 | ENEMA | RECTAL | Status: DC | PRN

## 2017-01-26 MED ORDER — FENTANYL CITRATE (PF) 100 MCG/2ML IJ SOLN
100.0000 ug | INTRAMUSCULAR | Status: DC | PRN
  Administered 2017-01-26 – 2017-01-27 (×2): 100 ug via INTRAVENOUS
  Filled 2017-01-26 (×2): qty 2

## 2017-01-26 NOTE — MAU Note (Signed)
Pt presents with c/o ctxs every 6 minutes apart since 0915 this morning.  Denies LOF or VB.  Reports +FM.  Last seen 2 weeks ago in office, cervix 1.5cm/50% per pt reports

## 2017-01-26 NOTE — Anesthesia Pain Management Evaluation Note (Signed)
  CRNA Pain Management Visit Note  Patient: Joy Rivera, 26 y.o., female  "Hello [facility] am a [facility]. We have an anesthesia team available at all times to provide care throughout the hospital, including epidural management and anesthesia for C-section. I don't know your plan for the delivery whether it a natural birth, water birth, IV sedation, nitrous supplementation, doula or epidural, but we want to meet your pain goals."   1.Was your pain managed to your expectations on prior hospitalizations?   Yes   2.What is your expectation for pain management during this hospitalization?     Epidural  3.How can we help you reach that goal? epidural   Record the patient's initial score and the patient's pain goal.   Pain: 0  Pain Goal: 5 [facility] wants you to be able to say your pain was always managed very well.  [facility]MARSHALL,Joy Rivera 01/26/2017

## 2017-01-26 NOTE — Progress Notes (Signed)
Joy Nailiffany A Rivera is a 26 y.o. [id number] at 5079w4d by LMP admitted for induction of labor due to Pre-eclamptic toxemia of pregnancy..  Subjective:   Objective: BP (!) 145/90   Pulse 80   Temp 97.9 F (36.6 C) (Oral)   Resp 16   Ht 5\' 4"  (1.626 m)   Wt 211 lb 4 oz (95.8 kg)   LMP 05/15/2016   SpO2 99%   BMI 36.26 kg/m  I/O last 3 completed shifts: In: 772.5 [I.V.:522.5; IV Piggyback:250] Out: -  Total I/O In: 365 [P.O.:215; I.V.:150] Out: 350 [Urine:350]  FHT:  FHR: 130 bpm, variability: moderate,  accelerations:  Present,  decelerations:  Absent UC:   regular, every 3-5 minutes SVE:   Dilation: 3.5 Effacement (%): 30 Station: -1 Exam by:: Arita Missawson, CNM  Labs: Lab Results  Component Value Date   WBC 5.4 01/26/2017   HGB 9.9 (L) 01/26/2017   HCT 30.7 (L) 01/26/2017   MCV 80.4 01/26/2017   PLT 137 (L) 01/26/2017    Assessment / Plan: Induction of labor due to preeclampsia GBS Positive  Labor: Progressing normally Preeclampsia:  on magnesium sulfate Fetal Wellbeing:  Category I Pain Control:  Labor support without medications -- requesting epidural I/D:  n/a Anticipated MOD:  NSVD  Joy Moraolitta Tionne Dayhoff, MSN, CNM 01/26/2017, 10:10 PM

## 2017-01-26 NOTE — Progress Notes (Addendum)
Z6X0@G6P2@ 36.[redacted] wksga. Presents to triage for labor eval. Denies LOF or bleeding. +FM.  EFM applied.   High BP in triage in severe ranges in triage.   Serial bp started.   Had ha today but didn't take anything. Denies rib pain or vision changes. Swelling around ankles noted for past month and pt stated "I never swell like this before".   1312: Provider assessing pt. Ordered for ha medicine.   1331: Med administered.   1333: up to bathroom.   1437: betamethasone received from pharmacy via tube system  1440: Birthing charge nurse notified. Pending room assignment.   1444: Betamethasone administered via left buttocks.   1259: up to bathroom   1255: Labs and IV done.  1407: GBS collected and sent  1445: pt to birthing via wheelchair taken by nurse tech.

## 2017-01-26 NOTE — H&P (Signed)
LABOR AND DELIVERY ADMISSION HISTORY AND PHYSICAL NOTE  SHANICQUA COLDREN is a 26 y.o. female [phone] with IUP at [redacted]w[redacted]d by LMP presenting for IOL for PEC w. Severe ranges.  She reports positive fetal movement. She denies leakage of fluid or vaginal bleeding.  Prenatal History/Complications: PNC at [facility] Pregnancy complications:  - PEC -Hx of hsv GBS pos   Past Medical History: Past Medical History:  Diagnosis Date  . Abnormal Pap smear   . Anxiety   . Depression    Clinic [facility] Prenatal Labs  Dating LMP +5wk U/S Blood type: --/--/A POS (01/26 0416)   Genetic Screen Too late Antibody:NEG (01/26 0416)  Anatomic [place]  Rubella:    GTT Early:               Third trimester:  RPR: Non Reactive (12/26 1045)   Flu vaccine  HBsAg:     TDaP vaccine                                               Rhogam: HIV:     Baby Food                                               AVW:[id number] (01/28 0000)(For PCN allergy, check sensitivities)  Contraception  Pap:  Circumcision    Pediatrician  CF:  Support Person  SMA  Prenatal Classes  Hgb electrophoresis:    Past Surgical History: Past Surgical History:  Procedure Laterality Date  . THERAPEUTIC ABORTION      Obstetrical History: OB History    Gravida Para Term Preterm AB Living   6 2 1 1 3 2    SAB TAB Ectopic Multiple Live Births   0 2 1 0 2      Social History: Social History   Socioeconomic History  . Marital status: Single    Spouse name: Not on file  . Number of children: Not on file  . Years of education: Not on file  . Highest education level: Not on file  Social Needs  . Financial resource strain: Not on file  . Food insecurity - worry: Not on file  . Food insecurity - inability: Not on file  . Transportation needs - medical: Not on file  . Transportation needs - non-medical: Not on file  Occupational History  . Not on file  Tobacco Use  . Smoking status: Never Smoker  . Smokeless tobacco: Never Used  Substance and  Sexual Activity  . Alcohol use: No  . Drug use: No  . Sexual activity: Yes    Birth control/protection: None  Other Topics Concern  . Not on file  Social History Narrative  . Not on file    Family History: Family History  Problem Relation Age of Onset  . Hypertension Father   . Heart disease Father   . Cancer Maternal Aunt   . Cancer Maternal Uncle   . Hypertension Paternal Grandmother   . Cancer Maternal Grandmother   . Cancer Maternal Grandfather     Allergies: No Known Allergies  Medications Prior to Admission  Medication Sig Dispense Refill Last Dose  . ranitidine (ZANTAC) 150 MG tablet Take 1 tablet (150 mg total) by mouth 2 (  two) times daily. 30 tablet 3 01/26/2017 at Unknown time  . Prenatal Vit-Fe Fumarate-FA (PRENATAL COMPLETE) 14-0.4 MG TABS Take 1 tablet by mouth daily. (Patient not taking: Reported on 01/26/2017) 60 each 0 Not Taking at Unknown time     Review of Systems  All systems reviewed and negative except as stated in HPI  Physical Exam Blood pressure (!) 125/54, pulse 96, temperature 98.4 F (36.9 C), temperature source Oral, resp. rate 20, height 5\' 4"  (1.626 m), weight 95.8 kg (211 lb 4 oz), last menstrual period 05/15/2016, SpO2 99 %, unknown if currently breastfeeding. General appearance: alert, cooperative, appears stated age and no distress Lungs: noIWB Heart: regular rate  Abdomen: soft, non-tender; bowel sounds normal Extremities: No calf swelling or tenderness Presentation: cephalic to manual exam Fetal monitoring: Cat 1 Uterine activity: irregular sparse contractions   No active HSV lesions to speculum exam  Prenatal labs: ABO, Rh: A/Positive/-- (09/18 1447) Antibody: Negative (09/18 1447) Rubella: 4.31 (09/18 1447) RPR: Non Reactive (12/06 1444)  HBsAg: Negative (09/18 1447)  HIV:   NR GC/Chlamydia: neg GBS: Positive (01/28 0000)  1 hr Glucola: 84 Genetic screening:  Too late for screen Anatomy US: normal  Prenatal Transfer  Tool  Maternal Diabetes: No Genetic Screening: too late for screen Maternal Ultrasounds/Referrals: Normal Fetal Ultrasounds or other Referrals:  None Maternal Substance Abuse:  No Significant Maternal Medications:  None Significant Maternal Lab Results: Lab values include: Group B Strep positive, Other: Hx of hsv  Results for orders placed or performed during the hospital encounter of 01/26/17 (from the past 24 hour(s))  Urinalysis, Routine w reflex microscopic   Collection Time: 01/26/17 12:00 PM  Result Value Ref Range   Color, Urine YELLOW YELLOW   APPearance CLEAR CLEAR   Specific Gravity, Urine 1.013 1.005 - 1.030   pH 6.0 5.0 - 8.0   Glucose, UA NEGATIVE NEGATIVE mg/dL   Hgb urine dipstick NEGATIVE NEGATIVE   Bilirubin Urine NEGATIVE NEGATIVE   Ketones, ur NEGATIVE NEGATIVE mg/dL   Protein, ur 409100 (A) NEGATIVE mg/dL   Nitrite NEGATIVE NEGATIVE   Leukocytes, UA NEGATIVE NEGATIVE   RBC / HPF 0-5 0 - 5 RBC/hpf   WBC, UA 0-5 0 - 5 WBC/hpf   Bacteria, UA NONE SEEN NONE SEEN   Squamous Epithelial / LPF 0-5 (A) NONE SEEN   Mucus PRESENT   Protein / creatinine ratio, urine   Collection Time: 01/26/17 12:00 PM  Result Value Ref Range   Creatinine, Urine 112.00 mg/dL   Total Protein, Urine 102 mg/dL   Protein Creatinine Ratio 0.91 (H) 0.00 - 0.15 mg/mg[Cre]  CBC with Differential   Collection Time: 01/26/17  1:17 PM  Result Value Ref Range   WBC 5.4 4.0 - 10.5 K/uL   RBC 3.82 (L) 3.87 - 5.11 MIL/uL   Hemoglobin 9.9 (L) 12.0 - 15.0 g/dL   HCT 81.130.7 (L) 91.436.0 - 78.246.0 %   MCV 80.4 78.0 - 100.0 fL   MCH 25.9 (L) 26.0 - 34.0 pg   MCHC 32.2 30.0 - 36.0 g/dL   RDW 95.613.1 21.311.5 - 08.615.5 %   Platelets 137 (L) 150 - 400 K/uL   Neutrophils Relative % 60 %   Neutro Abs 3.2 1.7 - 7.7 K/uL   Lymphocytes Relative 32 %   Lymphs Abs 1.7 0.7 - 4.0 K/uL   Monocytes Relative 6 %   Monocytes Absolute 0.3 0.1 - 1.0 K/uL   Eosinophils Relative 2 %   Eosinophils Absolute 0.1 0.0 -  0.7 K/uL    Basophils Relative 0 %   Basophils Absolute 0.0 0.0 - 0.1 K/uL  Comprehensive metabolic panel   Collection Time: 01/26/17  1:17 PM  Result Value Ref Range   Sodium 136 135 - 145 mmol/L   Potassium 4.2 3.5 - 5.1 mmol/L   Chloride 107 101 - 111 mmol/L   CO2 21 (L) 22 - 32 mmol/L   Glucose, Bld 82 65 - 99 mg/dL   BUN 13 6 - 20 mg/dL   Creatinine, Ser 1.610.77 0.44 - 1.00 mg/dL   Calcium 8.5 (L) 8.9 - 10.3 mg/dL   Total Protein 6.0 (L) 6.5 - 8.1 g/dL   Albumin 2.4 (L) 3.5 - 5.0 g/dL   AST 16 15 - 41 U/L   ALT 11 (L) 14 - 54 U/L   Alkaline Phosphatase 120 38 - 126 U/L   Total Bilirubin 0.5 0.3 - 1.2 mg/dL   GFR calc non Af Amer >60 >60 mL/min   GFR calc Af Amer >60 >60 mL/min   Anion gap 8 5 - 15    Patient Active Problem List   Diagnosis Date Noted  . Preterm uterine contractions in third trimester, antepartum 12/14/2016  . 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 Supervision of high risk pregnancy, antepartum 10/23/2016  . History of preterm delivery, currently pregnant 10/23/2016  . History of placenta abruption 10/23/2016  . Genital herpes 01/02/2016  . Genital warts complicating pregnancy 01/02/2016    Assessment: Joy Rivera is a 26 y.o. [id number] at 3488w4d here for IOL for PEC.   She also is GBS pos, hx of HSV w/o active lesions  #Labor: cytotec #Pain: Per maternal request #FWB: Cat1 #ID:  GBS neg #MOF: both #MOC:undecided #Circ:  Female baby  Marthenia RollingScott Bland 01/26/2017, 3:11 PM   I confirm that I have verified the information documented in the resident's note and that I have also personally reperformed the physical exam and all medical decision making activities.  The patient was seen and examined by me also Agree with note NST reactive and reassuring UCs as listed Cervical exams as listed in note  Aviva SignsWilliams, Kaeden Depaz L, CNM

## 2017-01-26 NOTE — MAU Provider Note (Signed)
History   [id number] @ 36.4 wks in with c/o contraction apx 6 min apart. Denies ROM or vag bleeding. Also states legs very swollen, and has mild headache.  CSN: [id number]  Arrival date & time 01/26/17  1130   None     Chief Complaint  Patient presents with  . Contractions    HPI  Past Medical History:  Diagnosis Date  . Abnormal Pap smear   . Anxiety   . Depression     Past Surgical History:  Procedure Laterality Date  . THERAPEUTIC ABORTION      Family History  Problem Relation Age of Onset  . Hypertension Father   . Heart disease Father   . Cancer Maternal Aunt   . Cancer Maternal Uncle   . Hypertension Paternal Grandmother   . Cancer Maternal Grandmother   . Cancer Maternal Grandfather     Social History   Tobacco Use  . Smoking status: Never Smoker  . Smokeless tobacco: Never Used  Substance Use Topics  . Alcohol use: No  . Drug use: No    OB History    Gravida Para Term Preterm AB Living   6 2 1 1 3 2    SAB TAB Ectopic Multiple Live Births   0 2 1 0 2      Review of Systems  Constitutional: Negative.   HENT: Negative.   Eyes: Negative.   Respiratory: Negative.   Cardiovascular: Negative.   Gastrointestinal: Positive for abdominal pain.  Endocrine: Negative.   Genitourinary: Negative.   Musculoskeletal: Negative.   Skin: Negative.   Allergic/Immunologic: Negative.   Neurological: Positive for headaches.  Hematological: Negative.   Psychiatric/Behavioral: Negative.     Allergies  Patient has no known allergies.  Home Medications    BP (!) 155/88   Pulse 64   Temp 98.4 F (36.9 C) (Oral)   Resp 20   Ht 5\' 4"  (1.626 m)   Wt 211 lb 4 oz (95.8 kg)   LMP 05/15/2016   SpO2 100%   BMI 36.26 kg/m   Physical Exam  Constitutional: She is oriented to person, place, and time. She appears well-developed and well-nourished.  HENT:  Head: Normocephalic.  Eyes: Pupils are equal, round, and reactive to light.  Neck: Normal range of  motion.  Cardiovascular: Normal rate, regular rhythm, normal heart sounds and intact distal pulses.  Pulmonary/Chest: Effort normal and breath sounds normal.  Abdominal: Soft. Bowel sounds are normal.  Genitourinary: Vagina normal and uterus normal.  Musculoskeletal: Normal range of motion.  Neurological: She is alert and oriented to person, place, and time. She has normal reflexes.  Skin: Skin is warm and dry.  Psychiatric: She has a normal mood and affect. Her behavior is normal. Judgment and thought content normal.    MAU Course  Procedures (including critical care time)  Labs Reviewed  URINALYSIS, ROUTINE W REFLEX MICROSCOPIC - Abnormal; Notable for the following components:      Result Value   Protein, ur 100 (*)    Squamous Epithelial / LPF 0-5 (*)    All other components within normal limits  CBC WITH DIFFERENTIAL/PLATELET  COMPREHENSIVE METABOLIC PANEL  PROTEIN / CREATININE RATIO, URINE   No results found.   1. Abdominal pain in pregnancy, third trimester   2. Preterm uterine contractions in third trimester, antepartum   3. Supervision of high risk pregnancy, antepartum   4. History of preterm delivery, currently pregnant   5. History of placenta abruption   6. Hypertension  during pregnancy, antepartum, unspecified hypertension in pregnancy type   7. Edema in pregnancy in third trimester   8. Headache in pregnancy, third trimester       MDM  BP's elevated. 4+ pitting edema, DTR"s 2+ no clonus. FHR pattern reactive with 15x15 accels no decels. SVE 3/th/post/high. Dr. Alysia PennaErvin in to evaluate pt and he also spoke with NICU which ok'ed admit. Will admit, BMX, and induce.

## 2017-01-27 ENCOUNTER — Inpatient Hospital Stay (HOSPITAL_COMMUNITY): Payer: Medicaid Other | Admitting: Anesthesiology

## 2017-01-27 DIAGNOSIS — Z3A36 36 weeks gestation of pregnancy: Secondary | ICD-10-CM

## 2017-01-27 DIAGNOSIS — O99824 Streptococcus B carrier state complicating childbirth: Secondary | ICD-10-CM

## 2017-01-27 DIAGNOSIS — O1410 Severe pre-eclampsia, unspecified trimester: Secondary | ICD-10-CM

## 2017-01-27 DIAGNOSIS — O134 Gestational [pregnancy-induced] hypertension without significant proteinuria, complicating childbirth: Secondary | ICD-10-CM

## 2017-01-27 LAB — CBC
HCT: 34.2 % — ABNORMAL LOW (ref 36.0–46.0)
HEMATOCRIT: 30.6 % — AB (ref 36.0–46.0)
HEMOGLOBIN: 10 g/dL — AB (ref 12.0–15.0)
Hemoglobin: 11.2 g/dL — ABNORMAL LOW (ref 12.0–15.0)
MCH: 26.2 pg (ref 26.0–34.0)
MCH: 26.4 pg (ref 26.0–34.0)
MCHC: 32.7 g/dL (ref 30.0–36.0)
MCHC: 32.7 g/dL (ref 30.0–36.0)
MCV: 80.1 fL (ref 78.0–100.0)
MCV: 80.7 fL (ref 78.0–100.0)
PLATELETS: 174 10*3/uL (ref 150–400)
Platelets: 159 10*3/uL (ref 150–400)
RBC: 3.79 MIL/uL — AB (ref 3.87–5.11)
RBC: 4.27 MIL/uL (ref 3.87–5.11)
RDW: 13.2 % (ref 11.5–15.5)
RDW: 13.3 % (ref 11.5–15.5)
WBC: 10.7 10*3/uL — AB (ref 4.0–10.5)
WBC: 8.8 10*3/uL (ref 4.0–10.5)

## 2017-01-27 LAB — MAGNESIUM: Magnesium: 6.3 mg/dL (ref 1.7–2.4)

## 2017-01-27 LAB — RPR: RPR Ser Ql: NONREACTIVE

## 2017-01-27 MED ORDER — LIDOCAINE HCL (PF) 1 % IJ SOLN
INTRAMUSCULAR | Status: DC | PRN
  Administered 2017-01-27 (×2): 5 mL

## 2017-01-27 MED ORDER — COCONUT OIL OIL
1.0000 "application " | TOPICAL_OIL | Status: DC | PRN
  Administered 2017-01-29: 1 via TOPICAL
  Filled 2017-01-27: qty 120

## 2017-01-27 MED ORDER — ZOLPIDEM TARTRATE 5 MG PO TABS: 5.0000 mg | ORAL_TABLET | Freq: Every evening | ORAL | Status: DC | PRN

## 2017-01-27 MED ORDER — PRENATAL MULTIVITAMIN CH
1.0000 | ORAL_TABLET | Freq: Every day | ORAL | Status: DC
  Administered 2017-01-27 – 2017-01-28 (×2): 1 via ORAL
  Filled 2017-01-27 (×2): qty 1

## 2017-01-27 MED ORDER — ONDANSETRON HCL 4 MG PO TABS: 4.0000 mg | ORAL_TABLET | ORAL | Status: DC | PRN

## 2017-01-27 MED ORDER — ACETAMINOPHEN 325 MG PO TABS
650.0000 mg | ORAL_TABLET | ORAL | Status: DC | PRN
  Administered 2017-01-29: 650 mg via ORAL
  Filled 2017-01-27: qty 2

## 2017-01-27 MED ORDER — WITCH HAZEL-GLYCERIN EX PADS: 1.0000 "application " | MEDICATED_PAD | CUTANEOUS | Status: DC | PRN

## 2017-01-27 MED ORDER — PRENATAL COMPLETE 14-0.4 MG PO TABS: 1.0000 | ORAL_TABLET | Freq: Every day | ORAL | Status: DC

## 2017-01-27 MED ORDER — DIPHENHYDRAMINE HCL 50 MG/ML IJ SOLN
12.5000 mg | INTRAMUSCULAR | Status: DC | PRN
  Administered 2017-01-27: 12.5 mg via INTRAVENOUS
  Filled 2017-01-27: qty 1

## 2017-01-27 MED ORDER — PHENYLEPHRINE 40 MCG/ML (10ML) SYRINGE FOR IV PUSH (FOR BLOOD PRESSURE SUPPORT)
80.0000 ug | PREFILLED_SYRINGE | INTRAVENOUS | Status: DC | PRN
  Filled 2017-01-27: qty 10
  Filled 2017-01-27: qty 5

## 2017-01-27 MED ORDER — LACTATED RINGERS IV SOLN
INTRAVENOUS | Status: DC
  Administered 2017-01-27 – 2017-01-28 (×2): via INTRAVENOUS

## 2017-01-27 MED ORDER — SIMETHICONE 80 MG PO CHEW: 80.0000 mg | CHEWABLE_TABLET | ORAL | Status: DC | PRN

## 2017-01-27 MED ORDER — BENZOCAINE-MENTHOL 20-0.5 % EX AERO: 1.0000 "application " | INHALATION_SPRAY | CUTANEOUS | Status: DC | PRN

## 2017-01-27 MED ORDER — FENTANYL 2.5 MCG/ML BUPIVACAINE 1/10 % EPIDURAL INFUSION (WH - ANES)
14.0000 mL/h | INTRAMUSCULAR | Status: DC | PRN
  Administered 2017-01-27: 14 mL/h via EPIDURAL
  Filled 2017-01-27: qty 100

## 2017-01-27 MED ORDER — DIPHENHYDRAMINE HCL 25 MG PO CAPS: 25.0000 mg | ORAL_CAPSULE | Freq: Four times a day (QID) | ORAL | Status: DC | PRN

## 2017-01-27 MED ORDER — ONDANSETRON HCL 4 MG/2ML IJ SOLN
4.0000 mg | INTRAMUSCULAR | Status: DC | PRN
  Administered 2017-01-27: 4 mg via INTRAVENOUS
  Filled 2017-01-27: qty 2

## 2017-01-27 MED ORDER — PHENYLEPHRINE 40 MCG/ML (10ML) SYRINGE FOR IV PUSH (FOR BLOOD PRESSURE SUPPORT)
80.0000 ug | PREFILLED_SYRINGE | INTRAVENOUS | Status: DC | PRN
  Filled 2017-01-27: qty 5

## 2017-01-27 MED ORDER — MAGNESIUM SULFATE 40 G IN LACTATED RINGERS - SIMPLE
2.0000 g/h | INTRAVENOUS | Status: DC
  Administered 2017-01-27: 2 g/h via INTRAVENOUS
  Filled 2017-01-27: qty 500
  Filled 2017-01-27: qty 40
  Filled 2017-01-27: qty 500

## 2017-01-27 MED ORDER — EPHEDRINE 5 MG/ML INJ
10.0000 mg | INTRAVENOUS | Status: DC | PRN
Start: 2017-01-27 — End: 2017-01-27
  Filled 2017-01-27: qty 2

## 2017-01-27 MED ORDER — LACTATED RINGERS IV SOLN
500.0000 mL | Freq: Once | INTRAVENOUS | Status: AC
  Administered 2017-01-27: 500 mL via INTRAVENOUS

## 2017-01-27 MED ORDER — OXYCODONE-ACETAMINOPHEN 5-325 MG PO TABS: 1.0000 | ORAL_TABLET | ORAL | Status: DC | PRN

## 2017-01-27 MED ORDER — DIBUCAINE 1 % RE OINT: 1.0000 "application " | TOPICAL_OINTMENT | RECTAL | Status: DC | PRN

## 2017-01-27 MED ORDER — EPHEDRINE 5 MG/ML INJ
10.0000 mg | INTRAVENOUS | Status: DC | PRN
  Filled 2017-01-27: qty 2

## 2017-01-27 MED ORDER — SENNOSIDES-DOCUSATE SODIUM 8.6-50 MG PO TABS
2.0000 | ORAL_TABLET | ORAL | Status: DC
  Administered 2017-01-28 (×2): 2 via ORAL
  Filled 2017-01-27 (×2): qty 2

## 2017-01-27 MED ORDER — OXYCODONE-ACETAMINOPHEN 5-325 MG PO TABS
2.0000 | ORAL_TABLET | ORAL | Status: DC | PRN
  Administered 2017-01-27 – 2017-01-29 (×6): 2 via ORAL
  Filled 2017-01-27 (×6): qty 2

## 2017-01-27 MED ORDER — TETANUS-DIPHTH-ACELL PERTUSSIS 5-2.5-18.5 LF-MCG/0.5 IM SUSP: 0.5000 mL | Freq: Once | INTRAMUSCULAR | Status: DC

## 2017-01-27 MED ORDER — INFLUENZA VAC SPLIT QUAD 0.5 ML IM SUSY
0.5000 mL | PREFILLED_SYRINGE | INTRAMUSCULAR | Status: AC
  Administered 2017-01-28: 0.5 mL via INTRAMUSCULAR
  Filled 2017-01-27: qty 0.5

## 2017-01-27 MED ORDER — IBUPROFEN 800 MG PO TABS
800.0000 mg | ORAL_TABLET | Freq: Three times a day (TID) | ORAL | Status: DC
  Administered 2017-01-27 – 2017-01-28 (×6): 800 mg via ORAL
  Filled 2017-01-27 (×6): qty 1

## 2017-01-27 NOTE — Progress Notes (Signed)
Drawing a cbc before pulling epidural line

## 2017-01-27 NOTE — Progress Notes (Signed)
Patient concerned regarding baby having lower birth weight. Multiple efforts of lactation supports. Patient personal choice. LEAD conversation discussed.

## 2017-01-27 NOTE — Anesthesia Preprocedure Evaluation (Signed)

## 2017-01-27 NOTE — Anesthesia Postprocedure Evaluation (Signed)
Anesthesia Post Note  Patient: Joy Rivera  Procedure(s) Performed: AN AD HOC LABOR EPIDURAL     Patient location during evaluation: Women's Unit Anesthesia Type: Epidural Level of consciousness: awake and alert and oriented Pain management: pain level controlled Vital Signs Assessment: post-procedure vital signs reviewed and stable Respiratory status: spontaneous breathing and nonlabored ventilation Cardiovascular status: stable Postop Assessment: no headache, patient able to bend at knees, no backache, no apparent nausea or vomiting, epidural receding and adequate PO intake Anesthetic complications: no    Last Vitals:  Vitals:   01/27/17 1044 01/27/17 1239  BP: (!) 144/84 (!) 144/90  Pulse: 74 84  Resp: 16 18  Temp: 36.7 C 36.6 C  SpO2:      Last Pain:  Vitals:   01/27/17 1239  TempSrc: Axillary  PainSc:    Pain Goal: Patients Stated Pain Goal: 8 (01/27/17 0001)               Wei Newbrough Hristova

## 2017-01-27 NOTE — Anesthesia Procedure Notes (Signed)
Epidural Patient location during procedure: OB  Staffing Anesthesiologist: Tabby Beaston, MD Performed: anesthesiologist   Preanesthetic Checklist Completed: patient identified, site marked, surgical consent, pre-op evaluation, timeout performed, IV checked, risks and benefits discussed and monitors and equipment checked  Epidural Patient position: sitting Prep: DuraPrep Patient monitoring: heart rate, continuous pulse ox and blood pressure Approach: right paramedian Location: L3-L4 Injection technique: LOR saline  Needle:  Needle type: Tuohy  Needle gauge: 17 G Needle length: 9 cm and 9 Needle insertion depth: 6 cm Catheter type: closed end flexible Catheter size: 20 Guage Catheter at skin depth: 10 cm Test dose: negative  Assessment Events: blood not aspirated, injection not painful, no injection resistance, negative IV test and no paresthesia  Additional Notes Patient identified. Risks/Benefits/Options discussed with patient including but not limited to bleeding, infection, nerve damage, paralysis, failed block, incomplete pain control, headache, blood pressure changes, nausea, vomiting, reactions to medication both or allergic, itching and postpartum back pain. Confirmed with bedside nurse the patient's most recent platelet count. Confirmed with patient that they are not currently taking any anticoagulation, have any bleeding history or any family history of bleeding disorders. Patient expressed understanding and wished to proceed. All questions were answered. Sterile technique was used throughout the entire procedure. Please see nursing notes for vital signs. Test dose was given through epidural needle and negative prior to continuing to dose epidural or start infusion. Warning signs of high block given to the patient including shortness of breath, tingling/numbness in hands, complete motor block, or any concerning symptoms with instructions to call for help. Patient was given  instructions on fall risk and not to get out of bed. All questions and concerns addressed with instructions to call with any issues.     

## 2017-01-28 LAB — CBC
HEMATOCRIT: 24.7 % — AB (ref 36.0–46.0)
Hemoglobin: 8.2 g/dL — ABNORMAL LOW (ref 12.0–15.0)
MCH: 26.5 pg (ref 26.0–34.0)
MCHC: 33.2 g/dL (ref 30.0–36.0)
MCV: 79.9 fL (ref 78.0–100.0)
PLATELETS: 148 10*3/uL — AB (ref 150–400)
RBC: 3.09 MIL/uL — AB (ref 3.87–5.11)
RDW: 13.4 % (ref 11.5–15.5)
WBC: 10.1 10*3/uL (ref 4.0–10.5)

## 2017-01-28 NOTE — Progress Notes (Signed)
MOB was referred for history of depression/anxiety. * Referral screened out by Clinical Social Worker because none of the following criteria appear to apply: ~ History of anxiety/depression during this pregnancy, or of post-partum depression. ~ Diagnosis of anxiety and/or depression within last 3 year; Per CSW note on 03/05/2016 MOB is an established patient at [facility] and MOB's MH dx was situational.  OR * MOB's symptoms currently being treated with medication and/or therapy.  Please contact the Clinical Social Worker if needs arise, by MOB request, or if MOB scores greater than 9/yes to question 10 on Edinburgh Postpartum Depression Screen.    Faylinn Schwenn Boyd-Gilyard, MSW, LCSW Clinical Social Work [phone] 

## 2017-01-28 NOTE — Progress Notes (Signed)
Post Partum Day 1 Subjective: no complaints and tolerating PO  Objective: Blood pressure (!) 123/93, pulse 64, temperature 98 F (36.7 C), temperature source Oral, resp. rate 18, height 5\' 4"  (1.626 m), weight 211 lb 4 oz (95.8 kg), last menstrual period 05/15/2016, SpO2 99 %, unknown if currently breastfeeding.  Physical Exam:  General: alert, cooperative and no distress Lochia: appropriate Uterine Fundus: firm Incision:  DVT Evaluation: No evidence of DVT seen on physical exam.  Recent Labs    01/27/17 0608 01/28/17 0544  HGB 10.0* 8.2*  HCT 30.6* 24.7*    Assessment/Plan: Reassess later today for discharge   LOS: 2 days   Lazaro ArmsLuther H Revella Shelton 01/28/2017, 9:05 AM

## 2017-01-29 MED ORDER — TRAMADOL HCL 50 MG PO TABS: 50.0000 mg | ORAL_TABLET | Freq: Four times a day (QID) | ORAL | 0 refills | Status: DC | PRN

## 2017-01-29 MED ORDER — AMLODIPINE BESYLATE 10 MG PO TABS: 10.0000 mg | ORAL_TABLET | Freq: Every day | ORAL | 0 refills | Status: DC

## 2017-01-29 NOTE — Lactation Note (Signed)
This note was copied from a baby'Rivera chart. Lactation Consultation Note  Patient Name: Joy Verdis Primeiffany Clevenger UUVOZ'DToday'Rivera Date: 01/29/2017 Reason for consult: Initial assessment;Late-preterm 34-36.6wks;Nipple pain/trauma Breastfeeding consultation services and support information given to patient.  Mom states she is not latching baby because of pain with feeding.  Baby has been noted to have a short lingual frenulum.  Encouraged to discuss with pediatrician.  MD resource sheet given to mom.  She has been pumping and last obtained 45 mls.  Discussed WIC loaner but mom states she will use a hand pump [facility]  Reviewed outpatient services and encouraged to call prn.  Maternal Data Has patient been taught Hand Expression?: Yes Does the patient have breastfeeding experience prior to this delivery?: No  Feeding    LATCH Score                   Interventions    Lactation Tools Discussed/Used WIC Program: Yes Pump Review: Setup, frequency, and cleaning;Milk Storage Initiated by:: RN Date initiated:: 01/28/17   Consult Status Consult Status: Complete    Huston FoleyMOULDEN, Joy Rivera 01/29/2017, 9:13 AM

## 2017-01-29 NOTE — Discharge Summary (Signed)
OB Discharge Summary     Patient Name: Joy Rivera DOB: [birth year] MRN: [id number]  Date of admission: 01/26/2017 Delivering MD: Hermina StaggersERVIN, MICHAEL L   Date of discharge: 01/29/2017  Admitting diagnosis: 36 weeks, cxt 6 mins apart   1. Abdominal pain in pregnancy, third trimester   2. Preterm uterine contractions in third trimester, antepartum   3. Supervision of high risk pregnancy, antepartum   4. History of preterm delivery, currently pregnant   5. History of placenta abruption   6. Hypertension during pregnancy, antepartum, unspecified hypertension in pregnancy type   7. Edema in pregnancy in third trimester   8. Headache in pregnancy, third trimester       MDM  BP's elevated. 4+ pitting edema, DTR"s 2+ no clonus. FHR pattern reactive with 15x15 accels no decels. SVE 3/th/post/high. Dr. Alysia PennaErvin in to evaluate pt and he also spoke with NICU which ok'ed admit. Will admit, BMX, and induce.     Intrauterine pregnancy: 4246w0d    Preeclampsia with severe features.  Secondary diagnosis:  Active Problems:   Indication for care in labor or delivery  Additional problems: none     Discharge diagnosis: Term Pregnancy Delivered and Preeclampsia (severe)                                                                                                Post partum procedures: Mag sulfate x 24 hr  Augmentation: Pitocin and Cytotec  Complications: None  Hospital course:  Induction of Labor With Vaginal Delivery   26 y.o. yo [phone] at 7046w0d was admitted to the hospital 01/26/2017 for induction of labor.  Indication for induction: Preeclampsia.  Patient had an  labor course as follows: Membrane Rupture Time/Date: 4:02 AM ,01/27/2017   Intrapartum Procedures: Episiotomy: None [1]                                         Lacerations:  None [1]  Patient had delivery of a Viable infant.  Information for the patient's newborn:  Satira AnisSimmons, Girl Elyna [[id number]][[id number]]  Delivery Method:  Vag-Spont   01/27/2017  Details of delivery can be found in separate delivery note.  Signed           [] Hide copied text  [] Hover for details   Delivery Note At 5:38 AM a viable female was delivered via Vaginal, Spontaneous (Presentation: Vertex ).  APGAR: 7, 9; weight  .   Placenta status: , .  Cord:  with the following complications:  Cord pH: NA  Anesthesia: Epidural  Episiotomy: None Lacerations: None Suture Repair: NA Est. Blood Loss (mL): 350  Mom to postpartum.  Baby to Couplet care / Skin to Skin.  Hermina StaggersMichael L Ervin 01/27/2017, 7:47 AM           Patient had a routine postpartum course, mag sulfate x 24 hr. Patient is discharged home 01/29/17. On Norvasc 10 x 30 d, and Tramadol for cramps, as ibuprofen considered inadequate. Pt to continue ibuprofen Physical exam  Vitals:   01/28/17 2018 01/28/17 2358 01/29/17 0417 01/29/17 0845  BP: 138/69 (!) 145/75 (!) 147/86 (!) 146/94  Pulse: 71 64 64 67  Resp: 18 18 18 18   Temp: 98.3 F (36.8 C) 98.2 F (36.8 C) 98.4 F (36.9 C) 98.9 F (37.2 C)  TempSrc: Oral Oral Oral Oral  SpO2: 100% 100% 100% 100%  Weight:   209 lb (94.8 kg)   Height:       General: alert, cooperative and no distress Lochia: appropriate Uterine Fundus: firm Incision:  DVT Evaluation: No evidence of DVT seen on physical exam. Labs: Lab Results  Component Value Date   WBC 10.1 01/28/2017   HGB 8.2 (L) 01/28/2017   HCT 24.7 (L) 01/28/2017   MCV 79.9 01/28/2017   PLT 148 (L) 01/28/2017   CMP Latest Ref Rng & Units 01/26/2017  Glucose 65 - 99 mg/dL 82  BUN 6 - 20 mg/dL 13  Creatinine 4.090.44 - 8.111.00 mg/dL 9.140.77  Sodium 782135 - 956145 mmol/L 136  Potassium 3.5 - 5.1 mmol/L 4.2  Chloride 101 - 111 mmol/L 107  CO2 22 - 32 mmol/L 21(L)  Calcium 8.9 - 10.3 mg/dL 2.1(H8.5(L)  Total Protein 6.5 - 8.1 g/dL 6.0(L)  Total Bilirubin 0.3 - 1.2 mg/dL 0.5  Alkaline Phos 38 - 126 U/L 120  AST 15 - 41 U/L 16  ALT 14 - 54 U/L 11(L)    Discharge  instruction: per After Visit Summary and "Baby and Me Booklet".  After visit meds:  Allergies as of 01/29/2017   No Known Allergies     Medication List    TAKE these medications   amLODipine 10 MG tablet Commonly known as:  NORVASC Take 1 tablet (10 mg total) by mouth daily.   PRENATAL COMPLETE 14-0.4 MG Tabs Take 1 tablet by mouth daily.   ranitidine 150 MG tablet Commonly known as:  ZANTAC Take 1 tablet (150 mg total) by mouth 2 (two) times daily.   traMADol 50 MG tablet Commonly known as:  ULTRAM Take 1 tablet (50 mg total) by mouth every 6 (six) hours as needed for moderate pain or severe pain.       Diet: routine diet  Activity: Advance as tolerated. Pelvic rest for 6 weeks.   Outpatient follow YQ:MVHQup:[facility] at 1 wk, [facility] Follow up Appt:No future appointments. Follow up Visit:No Follow-up on file.  Postpartum contraception: Nexplanon and IUD to be considered. wants a 3-5 yr plan. Please schedule this patient for Postpartum visit in: 6 weeks with the following provider: Any provider For C/S patients schedule nurse incision check in weeks 2 weeks: no High risk pregnancy complicated by: HTN Delivery mode:  SVD Anticipated Birth Control:  Nexplanon PP Procedures needed: BP check  By baby love Schedule Integrated BH visit: no   Newborn Data: Live born female  Birth Weight: 5 lb 15.2 oz (2700 g) APGAR: 7, 9  Newborn Delivery   Birth date/time:  01/27/2017 05:38:00 Delivery type:  Vaginal, Spontaneous     Baby Feeding: Breast Disposition:home with mother   01/29/2017 Tilda BurrowJohn V Cornisha Zetino, MD

## 2017-01-29 NOTE — Progress Notes (Signed)
Patient given discharge instruction and reviewed BABY and ME book for postpartum and infant care. SIDS reviewed and pre-eclampsia signs reviewed with patient and her FOB. Both patient and FOB verbalizes understanding of postpartum follow appt and infant follow-up appt.

## 2017-01-29 NOTE — Discharge Instructions (Signed)
Please monitor your blood pressure, and notify our clinic at [phone] for an appointment    Preeclampsia and Eclampsia Preeclampsia is a serious condition that develops only during pregnancy. It is also called toxemia of pregnancy. This condition causes high blood pressure along with other symptoms, such as swelling and headaches. These symptoms may develop as the condition gets worse. Preeclampsia may occur at 20 weeks of pregnancy or later. Diagnosing and treating preeclampsia early is very important. If not treated early, it can cause serious problems for you and your baby. One problem it can lead to is eclampsia, which is a condition that causes muscle jerking or shaking (convulsions or seizures) in the mother. Delivering your baby is the best treatment for preeclampsia or eclampsia. Preeclampsia and eclampsia symptoms usually go away after your baby is born. What are the causes? The cause of preeclampsia is not known. What increases the risk? The following risk factors make you more likely to develop preeclampsia:  Being pregnant for the first time.  Having had preeclampsia during a past pregnancy.  Having a family history of preeclampsia.  Having high blood pressure.  Being pregnant with twins or triplets.  Being 2235 or older.  Being African-American.  Having kidney disease or diabetes.  Having medical conditions such as lupus or blood diseases.  Being very overweight (obese).  What are the signs or symptoms? The earliest signs of preeclampsia are:  High blood pressure.  Increased protein in your urine. Your health care provider will check for this at every visit before you give birth (prenatal visit).  Other symptoms that may develop as the condition gets worse include:  Severe headaches.  Sudden weight gain.  Swelling of the hands, face, legs, and feet.  Nausea and vomiting.  Vision problems, such as blurred or double vision.  Numbness in the face, arms,  legs, and feet.  Urinating less than usual.  Dizziness.  Slurred speech.  Abdominal pain, especially upper abdominal pain.  Convulsions or seizures.  Symptoms generally go away after giving birth. How is this diagnosed? There are no screening tests for preeclampsia. Your health care provider will ask you about symptoms and check for signs of preeclampsia during your prenatal visits. You may also have tests that include:  Urine tests.  Blood tests.  Checking your blood pressure.  Monitoring your babys heart rate.  Ultrasound.  How is this treated? You and your health care provider will determine the treatment approach that is best for you. Treatment may include:  Having more frequent prenatal exams to check for signs of preeclampsia, if you have an increased risk for preeclampsia.  Bed rest.  Reducing how much salt (sodium) you eat.  Medicine to lower your blood pressure.  Staying in the hospital, if your condition is severe. There, treatment will focus on controlling your blood pressure and the amount of fluids in your body (fluid retention).  You may need to take medicine (magnesium sulfate) to prevent seizures. This medicine may be given as an injection or through an IV tube.  Delivering your baby early, if your condition gets worse. You may have your labor started with medicine (induced), or you may have a cesarean delivery.  Follow these instructions at home: Eating and drinking   Drink enough fluid to keep your urine clear or pale yellow.  Eat a healthy diet that is low in sodium. Do not add salt to your food. Check nutrition labels to see how much sodium a food or beverage contains.  Avoid caffeine. Lifestyle  Do not use any products that contain nicotine or tobacco, such as cigarettes and e-cigarettes. If you need help quitting, ask your health care provider.  Do not use alcohol or drugs.  Avoid stress as much as possible. Rest and get plenty of  sleep. General instructions  Take over-the-counter and prescription medicines only as told by your health care provider.  When lying down, lie on your side. This keeps pressure off of your baby.  When sitting or lying down, raise (elevate) your feet. Try putting some pillows underneath your lower legs.  Exercise regularly. Ask your health care provider what kinds of exercise are best for you.  Keep all follow-up and prenatal visits as told by your health care provider. This is important. How is this prevented? To prevent preeclampsia or eclampsia from developing during another pregnancy:  Get proper medical care during pregnancy. Your health care provider may be able to prevent preeclampsia or diagnose and treat it early.  Your health care provider may have you take a low-dose aspirin or a calcium supplement during your next pregnancy.  You may have tests of your blood pressure and kidney function after giving birth.  Maintain a healthy weight. Ask your health care provider for help managing weight gain during pregnancy.  Work with your health care provider to manage any long-term (chronic) health conditions you have, such as diabetes or kidney problems.  Contact a health care provider if:  You gain more weight than expected.  You have headaches.  You have nausea or vomiting.  You have abdominal pain.  You feel dizzy or light-headed. Get help right away if:  You develop sudden or severe swelling anywhere in your body. This usually happens in the legs.  You gain 5 lbs (2.3 kg) or more during one week.  You have severe: ? Abdominal pain. ? Headaches. ? Dizziness. ? Vision problems. ? Confusion. ? Nausea or vomiting.  You have a seizure.  You have trouble moving any part of your body.  You develop numbness in any part of your body.  You have trouble speaking.  You have any abnormal bleeding.  You pass out. This information is not intended to replace advice  given to you by your health care provider. Make sure you discuss any questions you have with your health care provider. Document Released: 01/20/2000 Document Revised: 09/20/2015 Document Reviewed: 08/29/2015 Elsevier Interactive Patient Education  [employer]2018 [employer]

## 2017-01-29 NOTE — Progress Notes (Signed)
Patient discharged with infant and FOB with no further questions.

## 2017-01-29 NOTE — Progress Notes (Signed)
Discussed with patient discharge plans, patient with questions about infant feeding and DEBP rental. Pt informed by [employer] of the rental cost, patient choosing not to pay the 30.00 returnable fee at this time, choosing to utilize the manual pump [facility] Patient also discussed with her pediatrician infant formula supplement. Pt informed that the hospital is not able to send her home with multiple bottles of formula. Local [facility] open if patient needed to pick up a small supply of formula, pt strongly encouraged to use breastmilk first and then supplement prn.

## 2017-02-04 ENCOUNTER — Telehealth: Payer: Self-pay | Admitting: *Deleted

## 2017-02-04 NOTE — Telephone Encounter (Signed)
Pharmacy notified PA done and approved for Tramadol.

## 2017-02-05 NOTE — L&D Delivery Note (Signed)
Delivery Note At 10:30 AM a viable female was delivered via Vaginal, Spontaneous (Presentation: cephalic ; LOA).  APGAR: 9, 9; weight: pending (appears AGA).  Patient delivered precipitously by nursing staff. Placenta delivered by me.    Placenta status: intact, 3-vessel cord.  Cord with the following complications: loose nuchal x 1.    Anesthesia: epidural  Episiotomy: None Lacerations: None Suture Repair: none Est. Blood Loss (mL): 50  Mom to postpartum.  Baby to Couplet care / Skin to Skin.  Joy AbbotNimeka Leyanna Rivera 12/21/2017, 11:50 AM

## 2017-03-11 ENCOUNTER — Ambulatory Visit: Payer: Medicaid Other | Admitting: Advanced Practice Midwife

## 2017-03-11 ENCOUNTER — Encounter: Payer: Self-pay | Admitting: General Practice

## 2017-04-02 ENCOUNTER — Other Ambulatory Visit: Payer: Self-pay | Admitting: Obstetrics and Gynecology

## 2017-04-03 ENCOUNTER — Telehealth: Payer: Self-pay | Admitting: Obstetrics and Gynecology

## 2017-04-03 NOTE — Telephone Encounter (Signed)
Automated Rx Request for amlodipine 10- mg /d x 30 tab refil requested from pharmacy Pt was on this for PP pressure control after admission for preeclampsia. Pt was given a 30 day supply, and it's been over 60 days.  I do not know if pt needs meds or not.  Message left for pt to contact her ob prenatal care provider to have her BP checked and a decision to be made on whether BP meds are still needed. Rx not refilled.

## 2017-05-14 ENCOUNTER — Encounter (HOSPITAL_COMMUNITY): Payer: Self-pay

## 2017-07-05 ENCOUNTER — Inpatient Hospital Stay (HOSPITAL_COMMUNITY): Payer: Medicaid Other

## 2017-07-05 ENCOUNTER — Inpatient Hospital Stay (HOSPITAL_COMMUNITY)
Admission: AD | Admit: 2017-07-05 | Discharge: 2017-07-05 | Disposition: A | Discharge status: Home / Self Care | Payer: Medicaid Other | Source: Ambulatory Visit | Attending: Obstetrics and Gynecology | Admitting: Obstetrics and Gynecology

## 2017-07-05 ENCOUNTER — Encounter: Payer: Self-pay | Admitting: Medical

## 2017-07-05 DIAGNOSIS — O2651 Maternal hypotension syndrome, first trimester: Secondary | ICD-10-CM | POA: Insufficient documentation

## 2017-07-05 DIAGNOSIS — F419 Anxiety disorder, unspecified: Secondary | ICD-10-CM | POA: Insufficient documentation

## 2017-07-05 DIAGNOSIS — O26899 Other specified pregnancy related conditions, unspecified trimester: Secondary | ICD-10-CM

## 2017-07-05 DIAGNOSIS — R51 Headache: Secondary | ICD-10-CM | POA: Insufficient documentation

## 2017-07-05 DIAGNOSIS — F329 Major depressive disorder, single episode, unspecified: Secondary | ICD-10-CM | POA: Insufficient documentation

## 2017-07-05 DIAGNOSIS — R109 Unspecified abdominal pain: Secondary | ICD-10-CM

## 2017-07-05 DIAGNOSIS — Z3A13 13 weeks gestation of pregnancy: Secondary | ICD-10-CM | POA: Insufficient documentation

## 2017-07-05 DIAGNOSIS — O26891 Other specified pregnancy related conditions, first trimester: Secondary | ICD-10-CM | POA: Diagnosis not present

## 2017-07-05 DIAGNOSIS — O26851 Spotting complicating pregnancy, first trimester: Secondary | ICD-10-CM

## 2017-07-05 DIAGNOSIS — R102 Pelvic and perineal pain: Secondary | ICD-10-CM | POA: Insufficient documentation

## 2017-07-05 DIAGNOSIS — M549 Dorsalgia, unspecified: Secondary | ICD-10-CM | POA: Insufficient documentation

## 2017-07-05 DIAGNOSIS — O99341 Other mental disorders complicating pregnancy, first trimester: Secondary | ICD-10-CM | POA: Insufficient documentation

## 2017-07-05 LAB — CBC WITH DIFFERENTIAL/PLATELET
BASOS ABS: 0 10*3/uL (ref 0.0–0.1)
Basophils Relative: 1 %
Eosinophils Absolute: 0.2 10*3/uL (ref 0.0–0.7)
Eosinophils Relative: 5 %
HEMATOCRIT: 39.6 % (ref 36.0–46.0)
HEMOGLOBIN: 13.1 g/dL (ref 12.0–15.0)
Lymphocytes Relative: 38 %
Lymphs Abs: 1.5 10*3/uL (ref 0.7–4.0)
MCH: 26.1 pg (ref 26.0–34.0)
MCHC: 33.1 g/dL (ref 30.0–36.0)
MCV: 79 fL (ref 78.0–100.0)
Monocytes Absolute: 0.2 10*3/uL (ref 0.1–1.0)
Monocytes Relative: 4 %
NEUTROS ABS: 2.1 10*3/uL (ref 1.7–7.7)
Neutrophils Relative %: 52 %
Platelets: 223 10*3/uL (ref 150–400)
RBC: 5.01 MIL/uL (ref 3.87–5.11)
RDW: 18 % — ABNORMAL HIGH (ref 11.5–15.5)
WBC: 4 10*3/uL (ref 4.0–10.5)

## 2017-07-05 LAB — URINALYSIS, ROUTINE W REFLEX MICROSCOPIC
Bilirubin Urine: NEGATIVE
Glucose, UA: NEGATIVE mg/dL
Hgb urine dipstick: NEGATIVE
Ketones, ur: NEGATIVE mg/dL
Leukocytes, UA: NEGATIVE
NITRITE: NEGATIVE
Protein, ur: NEGATIVE mg/dL
SPECIFIC GRAVITY, URINE: 1.024 (ref 1.005–1.030)
pH: 6 (ref 5.0–8.0)

## 2017-07-05 LAB — POCT PREGNANCY, URINE: PREG TEST UR: POSITIVE — AB

## 2017-07-05 LAB — HCG, QUANTITATIVE, PREGNANCY: hCG, Beta Chain, Quant, S: 79068 m[IU]/mL — ABNORMAL HIGH (ref <5)

## 2017-07-05 NOTE — MAU Note (Addendum)
Want to know how far along I am. Know LMP end of Feb. Having some headaches, cramps, back pain. Had preeclampsia with last pregnancy. Some pink spotting last night but none today. Occ sees spots in visual field and then gets really hot. Knows too early for Pree but wonders if may have chronic HTN.

## 2017-07-05 NOTE — Progress Notes (Signed)
Vonzella NippleJulie Wenzel PA in to discuss lab results with pt. Pt refused exam - just wants results and go home. Written and verbal d/c instructions given and understanding voiced.

## 2017-07-05 NOTE — Discharge Instructions (Signed)

## 2017-07-05 NOTE — MAU Provider Note (Signed)
History     CSN: [id number]  Arrival date and time: 07/05/17 2956   First Provider Initiated Contact with Patient 07/05/17 2333      Chief Complaint  Patient presents with  . Abdominal Pain  . Back Pain  . Headache  . Vaginal Bleeding   HPI  [name] is a 27 y.o. [id number] at [redacted]w[redacted]d by unsure LMP who presents to MAU today with complaint of back and abdominal pain. She also noted very light pink spotting last night that has resolved. She denies recent intercourse or abnormal discharge. She has had nausea without vomiting and intermittent loose stools. She denies fever.   OB History    Gravida  7   Para  2   Term  1   Preterm  1   AB  3   Living  2     SAB  0   TAB  2   Ectopic  1   Multiple  0   Live Births  2           Past Medical History:  Diagnosis Date  . Abnormal Pap smear   . Anxiety   . Depression     Past Surgical History:  Procedure Laterality Date  . THERAPEUTIC ABORTION      Family History  Problem Relation Age of Onset  . Hypertension Father   . Heart disease Father   . Cancer Maternal Aunt   . Cancer Maternal Uncle   . Hypertension Paternal Grandmother   . Cancer Maternal Grandmother   . Cancer Maternal Grandfather     Social History   Tobacco Use  . Smoking status: Never Smoker  . Smokeless tobacco: Never Used  Substance Use Topics  . Alcohol use: No  . Drug use: No    Allergies: No Known Allergies  No medications prior to admission.    Review of Systems  Constitutional: Negative for fever.  Gastrointestinal: Positive for abdominal pain and nausea. Negative for constipation, diarrhea and vomiting.  Genitourinary: Negative for dysuria, frequency, urgency, vaginal bleeding and vaginal discharge.  Musculoskeletal: Positive for back pain.   Physical Exam   Blood pressure 133/73, pulse 89, temperature 98.5 F (36.9 C), resp. rate 18, height  (1.6 m), weight 168 lb (76.2 kg), last menstrual period  04/01/2017, unknown if currently breastfeeding.  Physical Exam  Nursing note and vitals reviewed. Constitutional: She is oriented to person, place, and time. She appears well-developed and well-nourished. No distress.  HENT:  Head: Normocephalic and atraumatic.  Cardiovascular: Normal rate.  Respiratory: Effort normal.  GI: Soft. She exhibits no distension.  Genitourinary:  Genitourinary Comments: Patient refused pelvic exam  Neurological: She is alert and oriented to person, place, and time.  Skin: Skin is warm and dry. No erythema.  Psychiatric: She has a normal mood and affect.    Results for orders placed or performed during the hospital encounter of 07/05/17 (from the past 24 hour(s))  Urinalysis, Routine w reflex microscopic     Status: None   Collection Time: 07/05/17  7:31 PM  Result Value Ref Range   Color, Urine YELLOW YELLOW   APPearance CLEAR CLEAR   Specific Gravity, Urine 1.024 1.005 - 1.030   pH 6.0 5.0 - 8.0   Glucose, UA NEGATIVE NEGATIVE mg/dL   Hgb urine dipstick NEGATIVE NEGATIVE   Bilirubin Urine NEGATIVE NEGATIVE   Ketones, ur NEGATIVE NEGATIVE mg/dL   Protein, ur NEGATIVE NEGATIVE mg/dL   Nitrite NEGATIVE NEGATIVE  Leukocytes, UA NEGATIVE NEGATIVE  Pregnancy, urine POC     Status: Abnormal   Collection Time: 07/05/17  7:41 PM  Result Value Ref Range   Preg Test, Ur POSITIVE (A) NEGATIVE  CBC with Differential/Platelet     Status: Abnormal   Collection Time: 07/05/17  8:29 PM  Result Value Ref Range   WBC 4.0 4.0 - 10.5 K/uL   RBC 5.01 3.87 - 5.11 MIL/uL   Hemoglobin 13.1 12.0 - 15.0 g/dL   HCT 40.9 81.1 - 91.4 %   MCV 79.0 78.0 - 100.0 fL   MCH 26.1 26.0 - 34.0 pg   MCHC 33.1 30.0 - 36.0 g/dL   RDW 78.2 (H) 95.6 - 21.3 %   Platelets 223 150 - 400 K/uL   Neutrophils Relative % 52 %   Neutro Abs 2.1 1.7 - 7.7 K/uL   Lymphocytes Relative 38 %   Lymphs Abs 1.5 0.7 - 4.0 K/uL   Monocytes Relative 4 %   Monocytes Absolute 0.2 0.1 - 1.0 K/uL    Eosinophils Relative 5 %   Eosinophils Absolute 0.2 0.0 - 0.7 K/uL   Basophils Relative 1 %   Basophils Absolute 0.0 0.0 - 0.1 K/uL  hCG, quantitative, pregnancy     Status: Abnormal   Collection Time: 07/05/17  8:29 PM  Result Value Ref Range   hCG, Beta Chain, Quant, S 79,068 (H) <5 mIU/mL    US Ob Comp Less 14 Wks  Result Date: 07/05/2017 CLINICAL DATA:  Initial evaluation for pelvic cramps, back pain, recent vaginal spotting. Early pregnancy. EXAM: OBSTETRIC <14 WK [place] AND TRANSVAGINAL OB US TECHNIQUE: Both transabdominal and transvaginal ultrasound examinations were performed for complete evaluation of the gestation as well as the maternal uterus, adnexal regions, and pelvic cul-de-sac. Transvaginal technique was performed to assess early pregnancy. COMPARISON:  None available. FINDINGS: Intrauterine gestational sac: Single Yolk sac:  Absent. Embryo:  Present Cardiac Activity: Present Heart Rate: 155 bpm CRL: 73.6 mm   13 w   3 d                  [place] EDC: 01/07/2018 Subchorionic hemorrhage:  None visualized. Maternal uterus/adnexae: Ovaries are normal in appearance bilaterally. No adnexal mass. No free fluid. IMPRESSION: 1. Single live intrauterine pregnancy without complication, estimated gestational age [redacted] weeks and 3 days by crown-rump length. 2. No other acute maternal uterine or adnexal abnormality identified. Electronically Signed   By: Rise Mu M.D.   On: 07/05/2017 21:34    MAU Course  Procedures None  MDM +UPT UA, wet prep, GC/chlamydia, CBC, quant hCG, HIV, RPR and [place] today to rule out ectopic pregnancy A+ blood type in Epic from previous visit   Assessment and Plan  A: SIUP at [redacted]w[redacted]d Headache Round ligament pain  Hypotension, maternal   P:  Discharge home Tylenol PRN for pain  First trimester precautions discussed Discussed increased PO hydration for headache and "seeing spots"  Pregnancy confirmation letter given  Patient advised to follow-up with [facility]  or [facility] to start prenatal care Patient may return to MAU as needed or if her condition were to change or worsen  Vonzella Nipple, PA-C 07/06/2017, 12:43 AM

## 2017-07-06 LAB — RPR: RPR Ser Ql: NONREACTIVE

## 2017-07-06 LAB — HIV ANTIBODY (ROUTINE TESTING W REFLEX): HIV Screen 4th Generation wRfx: NONREACTIVE

## 2017-07-16 ENCOUNTER — Ambulatory Visit: Payer: Medicaid Other

## 2017-10-06 ENCOUNTER — Inpatient Hospital Stay (HOSPITAL_COMMUNITY)
Admission: AD | Admit: 2017-10-06 | Discharge: 2017-10-07 | Disposition: A | Discharge status: Home / Self Care | Payer: Medicaid Other | Source: Ambulatory Visit | Attending: Obstetrics & Gynecology | Admitting: Obstetrics & Gynecology

## 2017-10-06 ENCOUNTER — Encounter (HOSPITAL_COMMUNITY): Payer: Self-pay | Admitting: *Deleted

## 2017-10-06 ENCOUNTER — Other Ambulatory Visit: Payer: Self-pay

## 2017-10-06 ENCOUNTER — Inpatient Hospital Stay (HOSPITAL_BASED_OUTPATIENT_CLINIC_OR_DEPARTMENT_OTHER): Payer: Medicaid Other

## 2017-10-06 DIAGNOSIS — N939 Abnormal uterine and vaginal bleeding, unspecified: Secondary | ICD-10-CM | POA: Diagnosis present

## 2017-10-06 DIAGNOSIS — Z3A26 26 weeks gestation of pregnancy: Secondary | ICD-10-CM

## 2017-10-06 DIAGNOSIS — O26892 Other specified pregnancy related conditions, second trimester: Secondary | ICD-10-CM | POA: Insufficient documentation

## 2017-10-06 DIAGNOSIS — J029 Acute pharyngitis, unspecified: Secondary | ICD-10-CM | POA: Diagnosis not present

## 2017-10-06 DIAGNOSIS — O4692 Antepartum hemorrhage, unspecified, second trimester: Secondary | ICD-10-CM

## 2017-10-06 DIAGNOSIS — R109 Unspecified abdominal pain: Secondary | ICD-10-CM

## 2017-10-06 DIAGNOSIS — Z3686 Encounter for antenatal screening for cervical length: Secondary | ICD-10-CM | POA: Diagnosis not present

## 2017-10-06 DIAGNOSIS — Z8249 Family history of ischemic heart disease and other diseases of the circulatory system: Secondary | ICD-10-CM | POA: Insufficient documentation

## 2017-10-06 DIAGNOSIS — O4690 Antepartum hemorrhage, unspecified, unspecified trimester: Secondary | ICD-10-CM

## 2017-10-06 DIAGNOSIS — R1030 Lower abdominal pain, unspecified: Secondary | ICD-10-CM | POA: Diagnosis not present

## 2017-10-06 DIAGNOSIS — O99512 Diseases of the respiratory system complicating pregnancy, second trimester: Secondary | ICD-10-CM | POA: Insufficient documentation

## 2017-10-06 LAB — URINALYSIS, ROUTINE W REFLEX MICROSCOPIC
Bilirubin Urine: NEGATIVE
Glucose, UA: NEGATIVE mg/dL
Hgb urine dipstick: NEGATIVE
Ketones, ur: NEGATIVE mg/dL
Leukocytes, UA: NEGATIVE
Nitrite: NEGATIVE
Protein, ur: NEGATIVE mg/dL
Specific Gravity, Urine: 1.015 (ref 1.005–1.030)
pH: 6 (ref 5.0–8.0)

## 2017-10-06 LAB — WET PREP, GENITAL
Clue Cells Wet Prep HPF POC: NONE SEEN
Sperm: NONE SEEN
Trich, Wet Prep: NONE SEEN
Yeast Wet Prep HPF POC: NONE SEEN

## 2017-10-06 MED ORDER — MENTHOL 3 MG MT LOZG
1.0000 | LOZENGE | OROMUCOSAL | Status: DC | PRN
Start: 2017-10-06 — End: 2017-10-07
  Filled 2017-10-06: qty 9

## 2017-10-06 NOTE — MAU Provider Note (Signed)
History     CSN: [id number]  Arrival date and time: 10/06/17 2157   First Provider Initiated Contact with Patient 10/06/17 2253      Chief Complaint  Patient presents with  . Contractions  . Vaginal Bleeding  . Back Pain    Joy Rivera is a 27 y.o. G7P2 at [redacted]w[redacted]d who presents to MAU with complaints of vaginal bleeding and abdominal pain. She reports vaginal bleeding started this morning and reports bleeding was light pink spotting when she wiped. She reports vaginal bleeding increased to bright red bleeding with "pencil eraser size clots" prior to going to work, then while at work Joy clots increased to "grape size clots" where she started having to wear a pad. She reports abdominal pain is associated with vaginal bleeding and started after bleeding, describes pain as lower abdominal aching and cramping- rates pain 7/10 has not taken any medication for abdominal pain. She denies recent intercourse. Has a hx of PTD at 33 weeks. She has not started [facility]. Has NOB scheduled on 9/9 in clinic. She reports +FM. She denies vaginal discharge or LOF.    OB History    Gravida  7   Para  2   Term  1   Preterm  1   AB  3   Living  2     SAB  0   TAB  2   Ectopic  1   Multiple  0   Live Births  2           Past Medical History:  Diagnosis Date  . Abnormal Pap smear   . Anxiety   . Depression     Past Surgical History:  Procedure Laterality Date  . THERAPEUTIC ABORTION      Family History  Problem Relation Age of Onset  . Hypertension Father   . Heart disease Father   . Cancer Maternal Aunt   . Cancer Maternal Uncle   . Hypertension Paternal Grandmother   . Cancer Maternal Grandmother   . Cancer Maternal Grandfather     Social History   Tobacco Use  . Smoking status: Never Smoker  . Smokeless tobacco: Never Used  Substance Use Topics  . Alcohol use: No  . Drug use: No    Allergies: No Known Allergies  Medications Prior to Admission  Medication  Sig Dispense Refill Last Dose  . Prenatal Vit-Fe Fumarate-FA (PRENATAL COMPLETE) 14-0.4 MG TABS Take 1 tablet by mouth daily. (Patient not taking: Reported on 01/26/2017) 60 each 0 Not Taking at Unknown time  . ranitidine (ZANTAC) 150 MG tablet Take 1 tablet (150 mg total) by mouth 2 (two) times daily. 30 tablet 3 01/26/2017 at Unknown time  . traMADol (ULTRAM) 50 MG tablet Take 1 tablet (50 mg total) by mouth every 6 (six) hours as needed for moderate pain or severe pain. 30 tablet 0     Review of Systems  Constitutional: Negative.   Respiratory: Negative.   Cardiovascular: Negative.   Gastrointestinal: Positive for abdominal pain. Negative for constipation, diarrhea, nausea and vomiting.  Genitourinary: Positive for vaginal bleeding. Negative for difficulty urinating, dysuria, frequency, pelvic pain, urgency and vaginal pain.  Musculoskeletal: Negative.   Neurological: Negative.    Physical Exam   Blood pressure (!) 117/59, pulse 94, temperature 98.1 F (36.7 C), resp. rate 16, weight 80.7 kg, last menstrual period 04/01/2017, SpO2 100 %, unknown if currently breastfeeding.  Physical Exam  Nursing note and vitals reviewed. Constitutional: She is oriented to  person, place, and time. She appears well-developed and well-nourished. No distress.  HENT:  Head: Normocephalic.  Cardiovascular: Normal rate, regular rhythm and normal heart sounds.  Respiratory: Effort normal and breath sounds normal. No respiratory distress. She has no wheezes.  GI: Soft. There is no tenderness.  Gravid measuring at umbilicus   Genitourinary: Cervix exhibits no motion tenderness and no discharge. No bleeding in Joy vagina. No vaginal discharge found.  Genitourinary Comments: Pelvic examination noted no vaginal bleeding, moderate white vaginal discharge with no odor noted.   Neurological: She is alert and oriented to person, place, and time.  Skin: Skin is warm and dry.  Psychiatric: She has a normal mood and  affect. Her behavior is normal. Thought content normal.   Dilation: Fingertip(1.5cm externally) Effacement (%): 40 Cervical Position: Posterior Exam by:: Lanice Shirts CNM  MAU Course  Procedures  MDM Wet prep GC/C  [place] MFM OB transvaginal   Labs reviewed. Wet prep- negative. UA - negative. CL 3.7cm- no evidence of previa.  [place] Mfm Ob Transvaginal  Result Date: 10/07/2017 ----------------------------------------------------------------------  OBSTETRICS REPORT                        (Signed Final 10/07/2017 11:50 am) ---------------------------------------------------------------------- Patient Info  ID #:       [id number]                          D.O.B.:  [birth year] (26 yrs)  Name:       Joy Rivera               Visit Date: 10/06/2017 11:28 pm ---------------------------------------------------------------------- Performed By  Performed By:     Earley Brooke     Ref. Address:      Faculty                    BS, RDMS  Attending:        Noralee Space MD        Secondary Phy.:    MAU Nursing-                                                              MAU/Triage  Referred By:      Rudean Curt             Location:          [facility]                    Eldean Nanna CNM ---------------------------------------------------------------------- Orders   #  Description                          Code         Ordered By   1  [place] MFM OB TRANSVAGINAL               46962.9      Steward Drone  ----------------------------------------------------------------------   #  Order #                    Accession #                 Episode #   1  [id number]                  [id number]                  [id number]  ---------------------------------------------------------------------- Indications   [redacted] weeks gestation of pregnancy                Z3A.26   Vaginal bleeding in pregnancy, second          O46.92   trimester   Encounter for cervical length                  Z36.86   ---------------------------------------------------------------------- Fetal Evaluation  Num Of Fetuses:          1  Fetal Heart Rate(bpm):   147  Cardiac Activity:        Observed  Presentation:            Cephalic  Amniotic Fluid  AFI FV:      Within normal limits ---------------------------------------------------------------------- OB History  Gravidity:    7         Term:   1        Prem:   1        SAB:   1  TOP:          2       Ectopic:  1        Living: 2 ---------------------------------------------------------------------- Gestational Age  Best:          26w 5d     Det. By:  Previous Ultrasound      EDD:   01/07/18                                      (07/05/17) ---------------------------------------------------------------------- Cervix Uterus Adnexa  Cervix  Length:            3.7  cm.  Normal appearance by transvaginal scan ---------------------------------------------------------------------- Impression  Patient was evaluated for c/o vaginal bleeding and abdominal  pain.  A limited ultrasound study was performed. Amniotic fluid is  normal and good fetal activity is seen. On transvaginal scan,  Joy cervix measures 3.7 cm, which is normal. No evidence of  previa. ----------------------------------------------------------------------                  Noralee Space, MD Electronically Signed Final Report   10/07/2017 11:50 am ----------------------------------------------------------------------  Patient reports abdominal cramping has resolved since being in MAU. Rates pain 0/10. Perineal pad reassessed- no vaginal bleeding.   Results reviewed with patient. Cervical examination unchanged. No contractions on monitor or by palpation. Educated on pelvic rest and discussed reasons to return back to MAU for evaluation. Patient verbalizes understanding. Follow up as scheduled for prenatal appointment. Pt stable at time of discharge.   Assessment and Plan   1. Vaginal bleeding in pregnancy, second  trimester   2. Vaginal bleeding during pregnancy, antepartum   3. Encounter for antenatal screening for cervical length   4. Acute sore throat   5. [redacted] weeks gestation of pregnancy   6. Abdominal pain during pregnancy in second trimester    Discharge patient  Follow up as scheduled for prenatal appointment on 9/9 Return to MAU as needed  GC/C pending  Pelvic rest, hydration   Sharyon Cable 10/08/2017, 3:46 PM

## 2017-10-06 NOTE — MAU Note (Addendum)
Pt reports light bleeding since this morning. Then she had some small clots the size of a pencil eraser, then the clots grew bigger throughout the day to maybe the size of a grape. Pt had 4-5 clots today. Pt reports decrease in FM since this morning. + FM while in MAU Denies contractions. Denies recent intercourse. Pt also has a cough and sore throat

## 2017-10-07 ENCOUNTER — Encounter (HOSPITAL_COMMUNITY): Payer: Self-pay | Admitting: *Deleted

## 2017-10-08 LAB — GC/CHLAMYDIA PROBE AMP (~~LOC~~) NOT AT ARMC
Chlamydia: NEGATIVE
Neisseria Gonorrhea: NEGATIVE

## 2017-10-11 ENCOUNTER — Encounter (HOSPITAL_COMMUNITY): Payer: Self-pay | Admitting: Emergency Medicine

## 2017-10-11 ENCOUNTER — Emergency Department (HOSPITAL_COMMUNITY)
Admission: EM | Admit: 2017-10-11 | Discharge: 2017-10-12 | Disposition: A | Discharge status: Home / Self Care | Payer: Medicaid Other | Attending: Emergency Medicine | Admitting: Emergency Medicine

## 2017-10-11 DIAGNOSIS — J019 Acute sinusitis, unspecified: Secondary | ICD-10-CM | POA: Insufficient documentation

## 2017-10-11 DIAGNOSIS — Z79899 Other long term (current) drug therapy: Secondary | ICD-10-CM | POA: Insufficient documentation

## 2017-10-11 MED ORDER — AMOXICILLIN-POT CLAVULANATE 875-125 MG PO TABS
1.0000 | ORAL_TABLET | Freq: Once | ORAL | Status: AC
  Administered 2017-10-11: 1 via ORAL
  Filled 2017-10-11: qty 1

## 2017-10-11 MED ORDER — AMOXICILLIN-POT CLAVULANATE 875-125 MG PO TABS: 1.0000 | ORAL_TABLET | Freq: Two times a day (BID) | ORAL | 0 refills | Status: AC

## 2017-10-11 NOTE — ED Triage Notes (Signed)
Pt presents to ED for assessment of URI-like symptoms x 1 week with 3 days of severe frontal headache.  Pt is [redacted] weeks pregnant, denies any abdominal pain or issues r/t pregnancy.  Denies fevers.

## 2017-10-11 NOTE — ED Notes (Signed)
See EDP assessment 

## 2017-10-11 NOTE — Discharge Instructions (Addendum)
Your symptoms are consistent with sinus infection. I have prescribed you an antibiotic, Augmentin, to take for the next 5 days. You have received one dose today already. You may continue taking Tylenol for the pain/discomfort. Warm compresses on the face, humidifers and saline rinses (Neti pot) may provide you with additional relief.  Please follow-up with your PCP in 1 week after you complete the antibiotics for follow-up.  I hope you begin to feel better soon!

## 2017-10-11 NOTE — ED Provider Notes (Signed)
Patient placed in Quick Look pathway, seen and evaluated   Chief Complaint: Nasal congestion and frontal headache  HPI:   Patient has been having nasal congestion and frontal headache.  Her symptoms have been going on for under 10 days.  She denies any nausea or vomiting. She took 1,000 mg of tylenol at 4pm.   ROS: No fevers  Physical Exam:   Gen: No distress  Neuro: Awake and Alert  Skin: Warm    Focused Exam: Patient is awake and alert, normal speech, normal gait..     Initiation of care has begun. The patient has been counseled on the process, plan, and necessity for staying for the completion/evaluation, and the remainder of the medical screening examination    Norman Clay 10/11/17 2120    Margarita Grizzle, MD 10/17/17 [phone]

## 2017-10-12 NOTE — ED Notes (Signed)
Pt verbalizes understanding of d/c instructions. Prescriptions reviewed with patient. Pt ambulatory at d/c with all belongings and with family.   

## 2017-10-12 NOTE — ED Provider Notes (Signed)
[facility] EMERGENCY DEPARTMENT Provider Note  CSN: [id number] Arrival date & time: 10/11/17  2034  History   Chief Complaint Chief Complaint  Patient presents with  . URI    HPI Joy Rivera is a 27 y.o. female [redacted] weeks pregnant who presented to the ED for numerous upper respiratory complaints x1 week. She endorses subjective fevers, nasal congestion, rhinorrhea, sinus pain and pressure. She also describes a frontal, throbbing headache that has been unchanged since the other symptoms began. Endorses photophobia with headache. Denies vision changes, dizziness, lightheadedness, facial droop or slurred speech. No specific relieving or worsening factors. Denies recent travel or known sick contacts. Denies chest pain, SOB, palpitations, extremity swelling, abdominal pain, N/V, change in bowel habits, urinary symptoms or vaginal bleeding. Patient has tried Tylenol, Ibuprofen and OTC decongestants prior to coming to the ED daily for 5+ days without much relief.  Past Medical History:  Diagnosis Date  . Abnormal Pap smear   . Anxiety   . Depression     Patient Active Problem List   Diagnosis Date Noted  . Indication for care in labor or delivery 01/26/2017  . Preterm uterine contractions in third trimester, antepartum 12/14/2016  . 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 Supervision of high risk pregnancy, antepartum 10/23/2016  . History of preterm delivery, currently pregnant 10/23/2016  . History of placenta abruption 10/23/2016  . Genital herpes 01/02/2016  . Genital warts complicating pregnancy 01/02/2016    Past Surgical History:  Procedure Laterality Date  . THERAPEUTIC ABORTION       OB History    Gravida  7   Para  3   Term  2   Preterm  1   AB  3   Living  2     SAB  0   TAB  2   Ectopic  1   Multiple  0   Live Births  3            Home Medications    Prior  to Admission medications   Medication Sig Start Date End Date Taking? Authorizing Provider  amoxicillin-clavulanate (AUGMENTIN) 875-125 MG tablet Take 1 tablet by mouth 2 (two) times daily for 5 days. 10/11/17 10/16/17  Rashawna Scoles, Jerrel Ivory I, PA-C  Prenatal Vit-Fe Fumarate-FA (PRENATAL COMPLETE) 14-0.4 MG TABS Take 1 tablet by mouth daily. Patient not taking: Reported on 01/26/2017 10/11/16   Renne Crigler, PA-C  ranitidine (ZANTAC) 150 MG tablet Take 1 tablet (150 mg total) by mouth 2 (two) times daily. 01/12/17   Aviva Signs, CNM    Family History Family History  Problem Relation Age of Onset  . Hypertension Father   . Heart disease Father   . Cancer Maternal Aunt   . Cancer Maternal Uncle   . Hypertension Paternal Grandmother   . Cancer Maternal Grandmother   . Cancer Maternal Grandfather     Social History Social History   Tobacco Use  . Smoking status: Never Smoker  . Smokeless tobacco: Never Used  Substance Use Topics  . Alcohol use: No  . Drug use: No     Allergies   Patient has no known allergies.   Review of Systems Review of Systems  Constitutional: Positive for appetite change and fever. Negative for chills.  HENT: Positive for congestion, rhinorrhea, sinus pressure and sinus pain. Negative for ear pain, facial swelling, postnasal drip, sore throat and trouble swallowing.   Eyes: Positive for  photophobia. Negative for pain and visual disturbance.  Respiratory: Negative for choking, chest tightness and shortness of breath.   Cardiovascular: Negative.   Gastrointestinal: Negative.   Genitourinary: Negative.   Skin: Negative for rash.  Neurological: Positive for numbness and headaches. Negative for dizziness, weakness and light-headedness.  Psychiatric/Behavioral: Negative.    Physical Exam Updated Vital Signs BP 123/80   Pulse 85   Temp 98 F (36.7 C) (Oral)   Resp 16   LMP 04/01/2017   SpO2 100%   Physical Exam  Constitutional: She appears  well-developed and well-nourished.  HENT:  Head: Normocephalic and atraumatic.  Right Ear: Tympanic membrane, external ear and ear canal normal. No drainage or swelling. Tympanic membrane is not bulging.  Left Ear: Tympanic membrane, external ear and ear canal normal. No drainage or swelling. Tympanic membrane is not bulging.  Nose: Right sinus exhibits frontal sinus tenderness. Right sinus exhibits no maxillary sinus tenderness. Left sinus exhibits frontal sinus tenderness. Left sinus exhibits no maxillary sinus tenderness.  Mouth/Throat: Uvula is midline, oropharynx is clear and moist and mucous membranes are normal. No posterior oropharyngeal edema or posterior oropharyngeal erythema. No tonsillar exudate.  Eyes: Pupils are equal, round, and reactive to light. Conjunctivae, EOM and lids are normal. Right conjunctiva is not injected. Left conjunctiva is not injected.  No pain with ROMs bilaterally. Light sensitivity when pupils evaluated.  Neck: Normal range of motion and full passive range of motion without pain. Neck supple. No spinous process tenderness and no muscular tenderness present. No neck rigidity. Normal range of motion present.  Cardiovascular: Normal rate and regular rhythm.  Pulmonary/Chest: Effort normal and breath sounds normal. No tachypnea. No respiratory distress. She has no wheezes.  Neurological: No cranial nerve deficit.  Skin: Skin is warm and intact. Capillary refill takes less than 2 seconds. She is not diaphoretic. No pallor.  Nursing note and vitals reviewed.  ED Treatments / Results  Labs (all labs ordered are listed, but only abnormal results are displayed) Labs Reviewed - No data to display  EKG None  Radiology No results found.  Procedures Procedures (including critical care time)  Medications Ordered in ED Medications  amoxicillin-clavulanate (AUGMENTIN) 875-125 MG per tablet 1 tablet (1 tablet Oral Given 10/11/17 2357)     Initial Impression /  Assessment and Plan / ED Course  Triage vital signs and the nursing notes have been reviewed.  Pertinent labs & imaging results that were available during care of the patient were reviewed and considered in medical decision making (see chart for details).  Patient presents afebrile and [redacted] weeks pregnant with a 1 week history of upper respiratory complaints that have gone unresolved with consistent OTC and conservative therapy. Physical exam is consistent with sinusitis. There are no findings that suggest periorbital cellulitis, mastoiditis, retropharyngeal process or any other acute HEENT process. Given patient's length of symptoms without improvement with conservative therapy, likely bacterial etiology to symptoms. Patient closely followed by ob/gyn and PCP.   Final Clinical Impressions(s) / ED Diagnoses  1. Acute Sinusitis. Rx for Augmentin 875-125mg  BID x7 days prescribed. Education provided on OTC and supportive treatment for relief. Advised to follow-up with PCP or ob/gyn after completion of antibiotic treatment.  Dispo: Home. After thorough clinical evaluation, this patient is determined to be medically stable and can be safely discharged with the previously mentioned treatment and/or outpatient follow-up/referral(s). At this time, there are no other apparent medical conditions that require further screening, evaluation or treatment.   Final diagnoses:  Acute non-recurrent sinusitis, unspecified location    ED Discharge Orders         Ordered    amoxicillin-clavulanate (AUGMENTIN) 875-125 MG tablet  2 times daily     10/11/17 2356            Illiana Losurdo, [place] I, PA-C 10/12/17 1649    Dione Booze, MD 10/12/17 2251

## 2017-10-14 ENCOUNTER — Ambulatory Visit (INDEPENDENT_AMBULATORY_CARE_PROVIDER_SITE_OTHER): Payer: Medicaid Other | Admitting: Advanced Practice Midwife

## 2017-10-14 ENCOUNTER — Encounter: Payer: Self-pay | Admitting: Advanced Practice Midwife

## 2017-10-14 ENCOUNTER — Telehealth: Payer: Self-pay | Admitting: Family Medicine

## 2017-10-14 ENCOUNTER — Ambulatory Visit (INDEPENDENT_AMBULATORY_CARE_PROVIDER_SITE_OTHER): Payer: Self-pay | Admitting: Clinical

## 2017-10-14 VITALS — BP 124/73 | HR 90 | Wt 159.0 lb

## 2017-10-14 DIAGNOSIS — Z634 Disappearance and death of family member: Secondary | ICD-10-CM

## 2017-10-14 DIAGNOSIS — Z8759 Personal history of other complications of pregnancy, childbirth and the puerperium: Secondary | ICD-10-CM | POA: Diagnosis not present

## 2017-10-14 DIAGNOSIS — Z23 Encounter for immunization: Secondary | ICD-10-CM | POA: Diagnosis not present

## 2017-10-14 DIAGNOSIS — O0932 Supervision of pregnancy with insufficient antenatal care, second trimester: Secondary | ICD-10-CM | POA: Diagnosis not present

## 2017-10-14 DIAGNOSIS — O0992 Supervision of high risk pregnancy, unspecified, second trimester: Secondary | ICD-10-CM | POA: Diagnosis not present

## 2017-10-14 DIAGNOSIS — F4321 Adjustment disorder with depressed mood: Secondary | ICD-10-CM

## 2017-10-14 DIAGNOSIS — O0990 Supervision of high risk pregnancy, unspecified, unspecified trimester: Secondary | ICD-10-CM

## 2017-10-14 DIAGNOSIS — O0993 Supervision of high risk pregnancy, unspecified, third trimester: Secondary | ICD-10-CM

## 2017-10-14 MED ORDER — ASPIRIN EC 81 MG PO TBEC: 81.0000 mg | DELAYED_RELEASE_TABLET | Freq: Every day | ORAL | 11 refills | Status: DC

## 2017-10-14 NOTE — Telephone Encounter (Signed)
At today's visit at check out Patient said she will call us to schedule her next appt due to her work schedule.

## 2017-10-14 NOTE — BH Specialist Note (Signed)
Integrated Behavioral Health Initial Visit  MRN: [id number] Name: Joy Rivera  Number of Integrated Behavioral Health Clinician visits:: 1/6 Session Start time: 11:00 Session End time: 11:49 Total time: 50 minutes  Type of Service: Integrated Behavioral Health- Individual/Family Interpretor:No. Interpretor Name and Language: n/a   Warm Hand Off Completed.       SUBJECTIVE: Joy Rivera is a 27 y.o. female accompanied by n/a Patient was referred by Thressa Sheller, CNM for symptoms of depression and anxiety Patient reports the following symptoms/concerns: Pt states her primary concern today is that she is grieving the loss of her daughter at 85mo, due to the flu. Pt says she needs to talk about how she is feeling today, as this pregnancy is triggering her feelings of grief.  Duration of problem: Current pregnancy; Severity of problem: moderate  OBJECTIVE: Mood: Anxious and Depressed and Affect: Tearful Risk of harm to self or others: No plan to harm self or others  LIFE CONTEXT: Family and Social: Pt lives in her parents home with her two sons School/Work: Pt works full time Self-Care: - Life Changes: Current pregnancy; loss of daughter at 46mo  6 months ago  GOALS ADDRESSED: Patient will: 1. Reduce symptoms of: anxiety and depression 2. Increase knowledge and/or ability of: healthy habits  3. Demonstrate ability to: Increase healthy adjustment to current life circumstances, Increase adequate support systems for patient/family and Begin healthy grieving over loss  INTERVENTIONS: Interventions utilized: Supportive Counseling and Psychoeducation and/or Health Education  Standardized Assessments completed: GAD-7 and PHQ 9  ASSESSMENT: Patient currently experiencing Grief at loss of child   Patient may benefit from Initial OB introduction to integrated behavioral health services .  PLAN: 1. Follow up with behavioral health clinician on : As needed 2. Behavioral  recommendations:  -Contact [facility] for additional grief support (peer support) 3. Referral(s): Integrated Behavioral Health Services (In Clinic) 4. "From scale of 1-10, how likely are you to follow plan?": 9  Rae Lips, LCSW  Depression screen [facility] 2/9 01/10/2017 10/23/2016  Decreased Interest 2 1  Down, Depressed, Hopeless 1 1  PHQ - 2 Score 3 2  Altered sleeping 3 1  Tired, decreased energy 3 1  Change in appetite 3 1  Feeling bad or failure about yourself  2 1  Trouble concentrating 0 1  Moving slowly or fidgety/restless 0 0  Suicidal thoughts 0 0  PHQ-9 Score 14 7   GAD 7 : Generalized Anxiety Score 01/10/2017 10/23/2016  Nervous, Anxious, on Edge 2 1  Control/stop worrying 3 1  Worry too much - different things 3 1  Trouble relaxing 3 1  Restless 0 0  Easily annoyed or irritable 3 3  Afraid - awful might happen 0 0  Total GAD 7 Score 14 7    Depression screen [facility] 2/9 10/14/2017 01/10/2017 10/23/2016  Decreased Interest 3 2 1   Down, Depressed, Hopeless 3 1 1   PHQ - 2 Score 6 3 2   Altered sleeping 3 3 1   Tired, decreased energy 3 3 1   Change in appetite 3 3 1   Feeling bad or failure about yourself  3 2 1   Trouble concentrating 1 0 1  Moving slowly or fidgety/restless 1 0 0  Suicidal thoughts 0 0 0  PHQ-9 Score 20 14 7    GAD 7 : Generalized Anxiety Score 10/14/2017 01/10/2017 10/23/2016  Nervous, Anxious, on Edge 2 2 1   Control/stop worrying 3 3 1   Worry too much - different things 3 3 1  Trouble relaxing 3 3 1   Restless 1 0 0  Easily annoyed or irritable 3 3 3   Afraid - awful might happen 3 0 0  Total GAD 7 Score 18 14 7

## 2017-10-14 NOTE — Progress Notes (Signed)
Subjective:   Joy Rivera is a 27 y.o. [phone] at 69w6dby midtrimester ultrasound being seen today for her first obstetrical visit.  Her obstetrical history is significant for obesity, pregnancy induced hypertension and late prenatal care, hx of preterm delivery x 2 . Patient does intend to breast feed. Pregnancy history fully reviewed.  Patient reports no complaints.  HISTORY: OB History  Gravida Para Term Preterm AB Living  _0 SAB TAB Ectopic Multiple Live Births  0 2 1 0 3    # Outcome Date GA Lbr Len/2nd Weight Sex Delivery Anes PTL Lv  7 Current           6 Preterm 01/27/17 343w5d F Vag-Spont   DEC     Birth Comments: Infant died at 3 69onths of age d/t complicatiosn from the flu     Complications: Pre-eclampsia, severe  5 Preterm 03/04/16 3313w1d:35 / 00:06 4 lb 8.3 oz (2.05 kg) M Vag-Spont EPI Y LIV     Birth Comments: see NICU note     Complications: Abruptio Placenta     Name: Forbush,BOY Fernanda     Apgar1: 5  Apgar5: 8  4 Term 11/12/12 [id number] / 01:08 7 lb 8.1 oz (3.405 kg) M Vag-Spont EPI  LIV     Name: Hy,BOY Edmund     Apgar1: 7  Apgar5: 9  3 Ectopic           2 TAB           1 TAB             Last pap smear was done 2018 and was normal  Past Medical History:  Diagnosis Date  . Abnormal Pap smear   . Anxiety   . Depression    Past Surgical History:  Procedure Laterality Date  . THERAPEUTIC ABORTION     Family History  Problem Relation Age of Onset  . Hypertension Father   . Heart disease Father   . Cancer Maternal Aunt   . Cancer Maternal Uncle   . Hypertension Paternal Grandmother   . Cancer Maternal Grandmother   . Cancer Maternal Grandfather    Social History   Tobacco Use  . Smoking status: Never Smoker  . Smokeless tobacco: Never Used  Substance Use Topics  . Alcohol use: No  . Drug use: No   No Known Allergies Current Outpatient Medications on File Prior to Visit  Medication Sig Dispense Refill  .  amoxicillin-clavulanate (AUGMENTIN) 875-125 MG tablet Take 1 tablet by mouth 2 (two) times daily for 5 days. 10 tablet 0  . Prenatal Vit-Fe Fumarate-FA (PRENATAL COMPLETE) 14-0.4 MG TABS Take 1 tablet by mouth daily. 60 each 0  . ranitidine (ZANTAC) 150 MG tablet Take 1 tablet (150 mg total) by mouth 2 (two) times daily. 30 tablet 3   No current facility-administered medications on file prior to visit.     Review of Systems Pertinent items noted in HPI and remainder of comprehensive ROS otherwise negative.  Exam   Vitals:   10/14/17 1009  BP: 124/73  Pulse: 90  Weight: 159 lb (72.1 kg)   Fetal Heart Rate (bpm): 148  Uterus:     Pelvic Exam: Perineum: no hemorrhoids, normal perineum   Vulva: normal external genitalia, no lesions   Vagina:  normal mucosa, normal discharge   Cervix: no lesions and normal, pap smear done.    Adnexa: normal adnexa and no mass, fullness,  tenderness   Bony Pelvis: average  System: General: well-developed, well-nourished female in no acute distress   Breast:  normal appearance, no masses or tenderness   Skin: normal coloration and turgor, no rashes   Neurologic: oriented, normal, negative, normal mood   Extremities: normal strength, tone, and muscle mass, ROM of all joints is normal   HEENT PERRLA, extraocular movement intact and sclera clear, anicteric   Mouth/Teeth mucous membranes moist, pharynx normal without lesions and dental hygiene good   Neck supple and no masses   Cardiovascular: regular rate and rhythm   Respiratory:  no respiratory distress, normal breath sounds   Abdomen: soft, non-tender; bowel sounds normal; no masses,  no organomegaly    Assessment:   Pregnancy: [id number] Patient Active Problem List   Diagnosis Date Noted  . Supervision of high risk pregnancy, antepartum 10/14/2017  . History of pre-eclampsia with severe features  10/14/2017  . 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 History of preterm delivery, currently pregnant 10/23/2016  . History of placenta abruption 10/23/2016  . Genital herpes 01/02/2016  . Genital warts complicating pregnancy 01/02/2016     Plan:  1. Supervision of high risk pregnancy, antepartum, third trimester - Routine care - Culture, OB Urine - US MFM OB COMP + 14 WK; Future - Obstetric Panel, Including HIV - Tdap vaccine greater than or equal to 7yo IM - Flu Vaccine QUAD 36+ mos IM - Hemoglobinopathy Evaluation - Cystic fibrosis gene test - SMN1 COPY NUMBER ANALYSIS (SMA Carrier Screen) - Cervicovaginal ancillary only - Comp Met (CMET) - Protein / creatinine ratio, urine - 2 hour GTT at next visit. Patient unable to stay today   2. Supervision of high risk pregnancy, antepartum   3. History of pre-eclampsia with severe features  - Baseline labs at NOB - Start baby ASA    Initial labs drawn. Continue prenatal vitamins. Genetic Screening discussed, First trimester screen, Quad screen and NIPS: too late. Ultrasound discussed; fetal anatomic survey: ordered. Problem list reviewed and updated. The nature of [facility] with multiple MDs and other Advanced Practice Providers was explained to patient; also emphasized that residents, students are part of our team. Routine obstetric precautions reviewed. 50% of 45 min visit spent in counseling and coordination of care. Return in about 2 weeks (around 10/28/2017).   

## 2017-10-16 LAB — URINE CULTURE, OB REFLEX

## 2017-10-16 LAB — CULTURE, OB URINE

## 2017-10-21 LAB — HEMOGLOBINOPATHY EVALUATION
FERRITIN: 14 ng/mL — AB (ref 15–150)
HGB F QUANT: 0 % (ref 0.0–2.0)
Hgb A2 Quant: 2 % (ref 1.8–3.2)
Hgb A: 98 % (ref 96.4–98.8)
Hgb C: 0 %
Hgb S: 0 %
Hgb Solubility: NEGATIVE
Hgb Variant: 0 %

## 2017-10-21 LAB — OBSTETRIC PANEL, INCLUDING HIV
Antibody Screen: NEGATIVE
Basophils Absolute: 0 10*3/uL (ref 0.0–0.2)
Basos: 0 %
EOS (ABSOLUTE): 0.1 10*3/uL (ref 0.0–0.4)
Eos: 1 %
HEP B S AG: NEGATIVE
HIV Screen 4th Generation wRfx: NONREACTIVE
Hematocrit: 32.4 % — ABNORMAL LOW (ref 34.0–46.6)
Hemoglobin: 10.7 g/dL — ABNORMAL LOW (ref 11.1–15.9)
IMMATURE GRANULOCYTES: 0 %
Immature Grans (Abs): 0 10*3/uL (ref 0.0–0.1)
LYMPHS ABS: 1.6 10*3/uL (ref 0.7–3.1)
Lymphs: 30 %
MCH: 26.8 pg (ref 26.6–33.0)
MCHC: 33 g/dL (ref 31.5–35.7)
MCV: 81 fL (ref 79–97)
MONOS ABS: 0.3 10*3/uL (ref 0.1–0.9)
Monocytes: 5 %
NEUTROS PCT: 64 %
Neutrophils Absolute: 3.5 10*3/uL (ref 1.4–7.0)
PLATELETS: 255 10*3/uL (ref 150–450)
RBC: 3.99 x10E6/uL (ref 3.77–5.28)
RDW: 13.3 % (ref 12.3–15.4)
RPR Ser Ql: NONREACTIVE
Rh Factor: POSITIVE
Rubella Antibodies, IGG: 5.72 index (ref >0.99)
WBC: 5.6 10*3/uL (ref 3.4–10.8)

## 2017-10-21 LAB — COMPREHENSIVE METABOLIC PANEL
A/G RATIO: 1.1 — AB (ref 1.2–2.2)
ALBUMIN: 3.3 g/dL — AB (ref 3.5–5.5)
ALK PHOS: 96 IU/L (ref 39–117)
ALT: 7 IU/L (ref 0–32)
AST: 12 IU/L (ref 0–40)
BUN / CREAT RATIO: 16 (ref 9–23)
BUN: 10 mg/dL (ref 6–20)
Bilirubin Total: <0.2 mg/dL (ref 0.0–1.2)
CO2: 22 mmol/L (ref 20–29)
Calcium: 8.2 mg/dL — ABNORMAL LOW (ref 8.7–10.2)
Chloride: 100 mmol/L (ref 96–106)
Creatinine, Ser: 0.62 mg/dL (ref 0.57–1.00)
GFR calc Af Amer: 145 mL/min/{1.73_m2} (ref >59)
GFR calc non Af Amer: 126 mL/min/{1.73_m2} (ref >59)
GLUCOSE: 79 mg/dL (ref 65–99)
Globulin, Total: 2.9 g/dL (ref 1.5–4.5)
POTASSIUM: 3.7 mmol/L (ref 3.5–5.2)
Sodium: 136 mmol/L (ref 134–144)
Total Protein: 6.2 g/dL (ref 6.0–8.5)

## 2017-10-21 LAB — SMN1 COPY NUMBER ANALYSIS (SMA CARRIER SCREENING)

## 2017-10-21 LAB — PROTEIN / CREATININE RATIO, URINE
CREATININE, UR: 191.1 mg/dL
Protein, Ur: 19.6 mg/dL
Protein/Creat Ratio: 103 mg/g creat (ref 0–200)

## 2017-10-21 LAB — CYSTIC FIBROSIS GENE TEST

## 2017-10-24 ENCOUNTER — Encounter (HOSPITAL_COMMUNITY): Payer: Self-pay

## 2017-10-25 IMAGING — US US OB TRANSVAGINAL
1 series · 15 of 28 positions shown · non-contrast
Comparison: None.

CLINICAL DATA: Two day history of lower abdominal pain and bleeding

EXAM:
OBSTETRIC <14 WK US AND TRANSVAGINAL OB US
TECHNIQUE: Both transabdominal and transvaginal ultrasound examinations were
performed for complete evaluation of the gestation as well as the
maternal uterus, adnexal regions, and pelvic cul-de-sac.
Transvaginal technique was performed to assess early pregnancy.

[Series 1: us ob transvaginal · 15 of 79 slices shown]
[im 1/79]
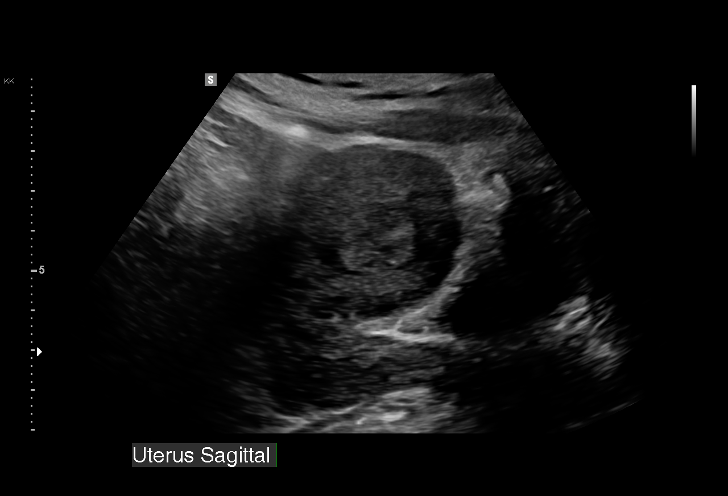
[im 6/79]
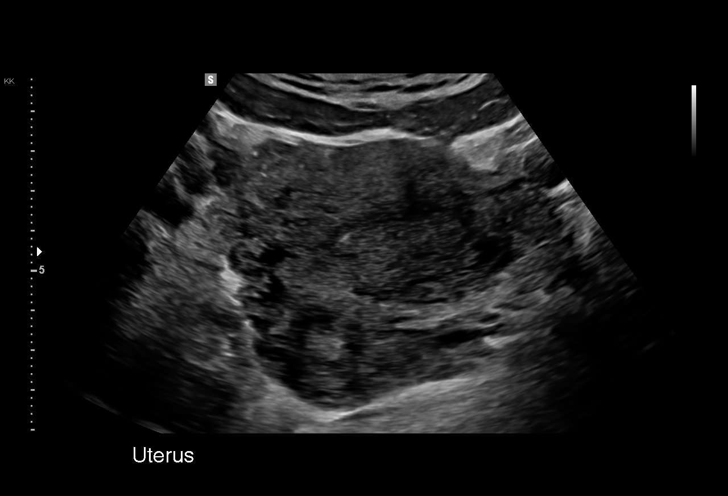
[im 12/79]
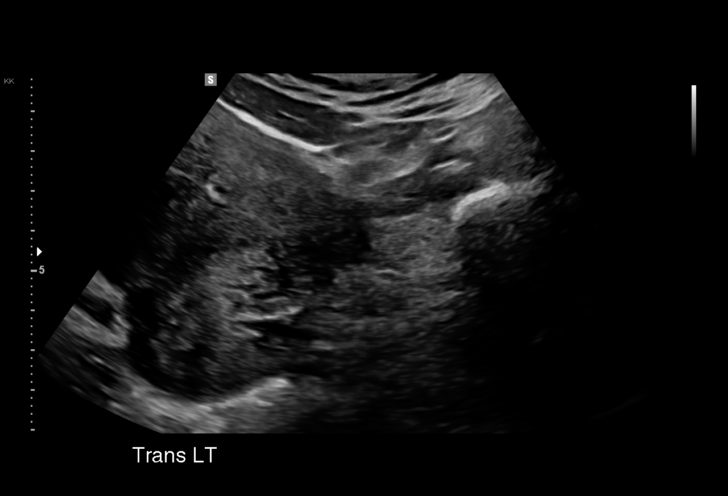
[im 18/79]
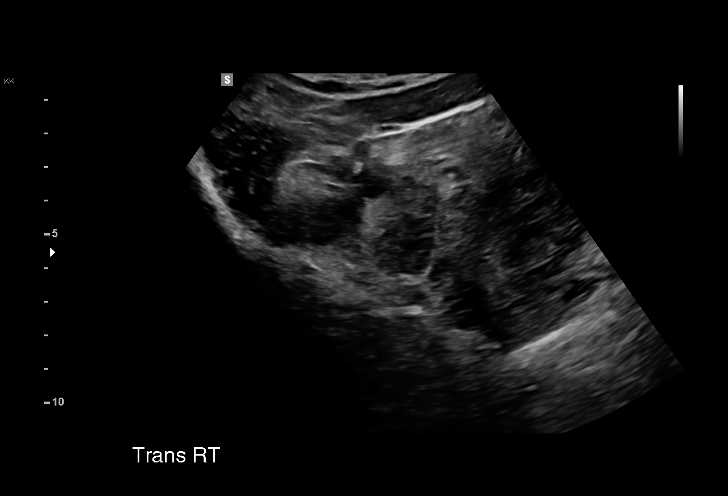
[im 24/79]
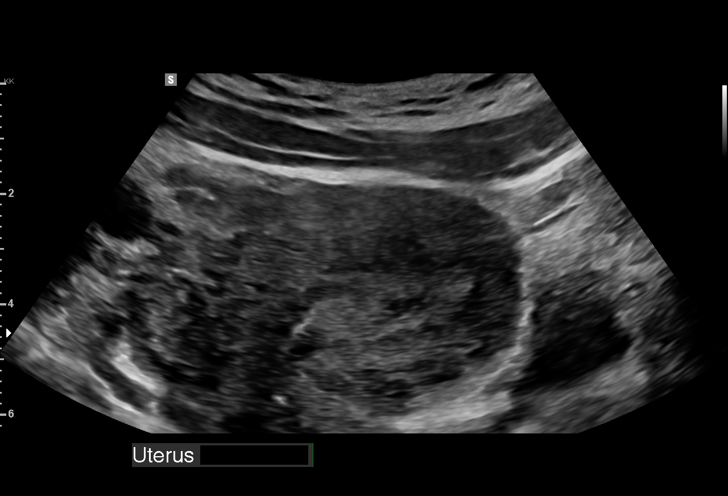
[im 29/79]
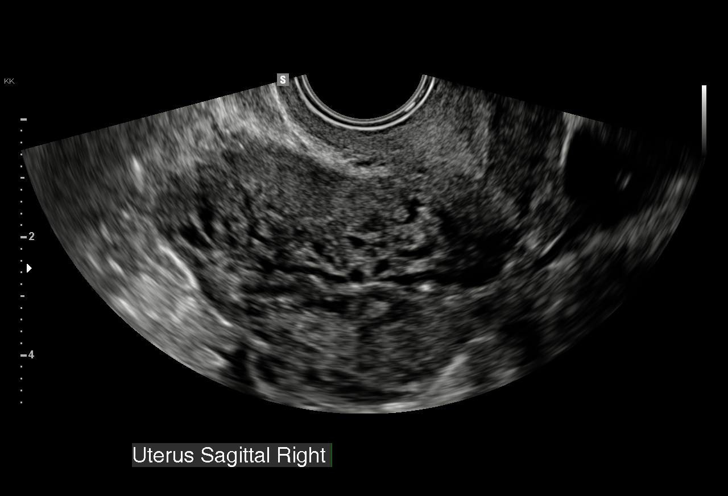
[im 35/79]
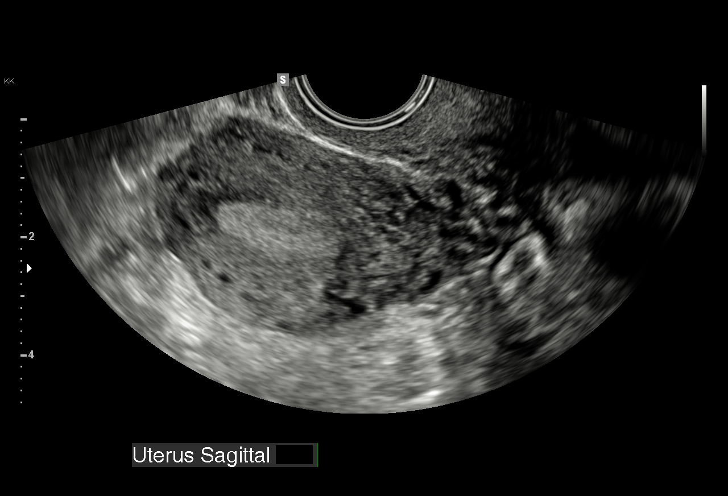
[im 41/79]
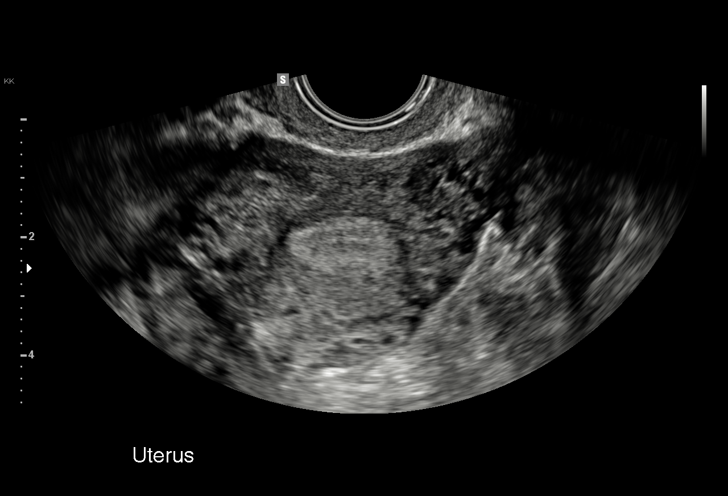
[im 44/79]
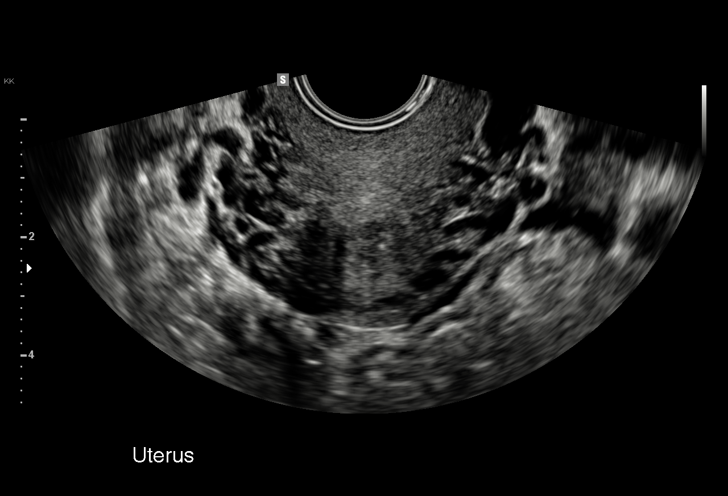
[im 50/79]
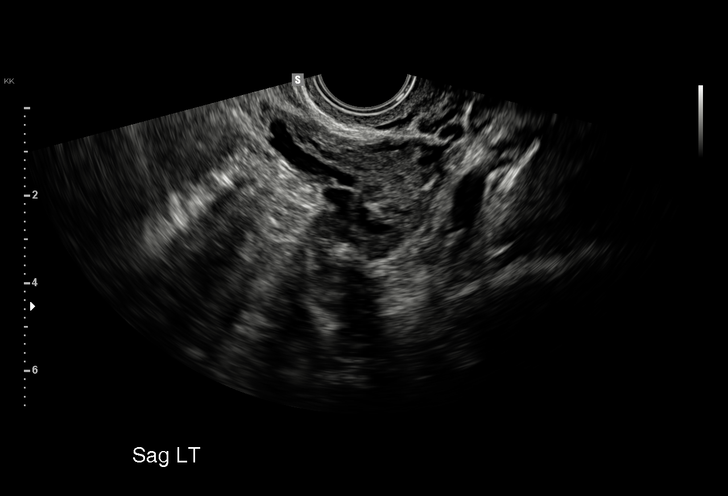
[im 55/79]
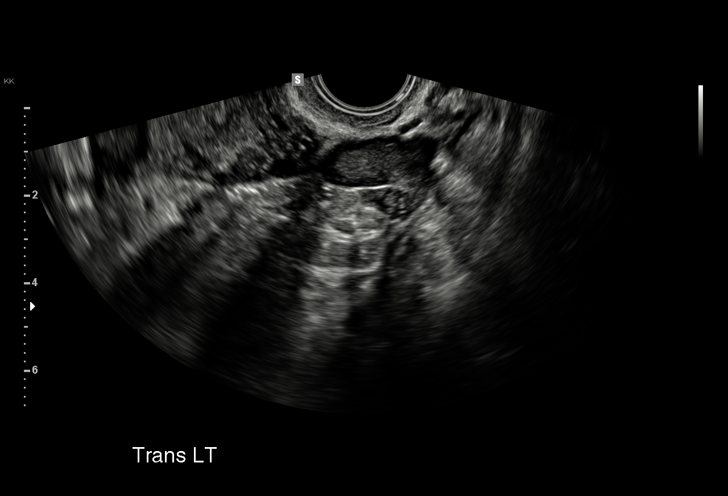
[im 61/79]
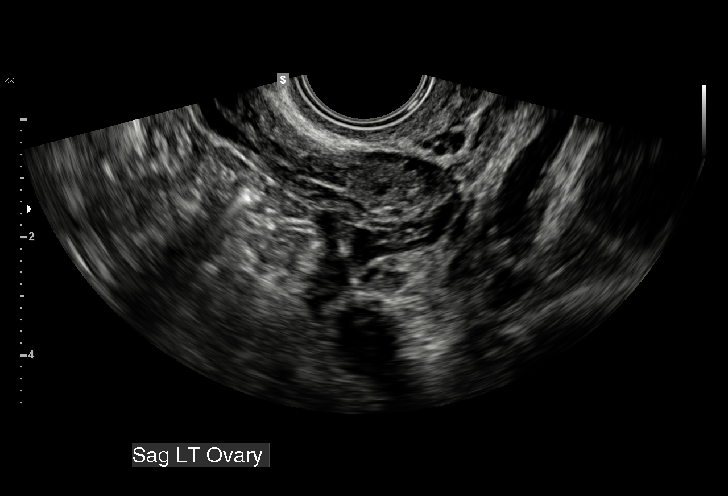
[im 67/79]
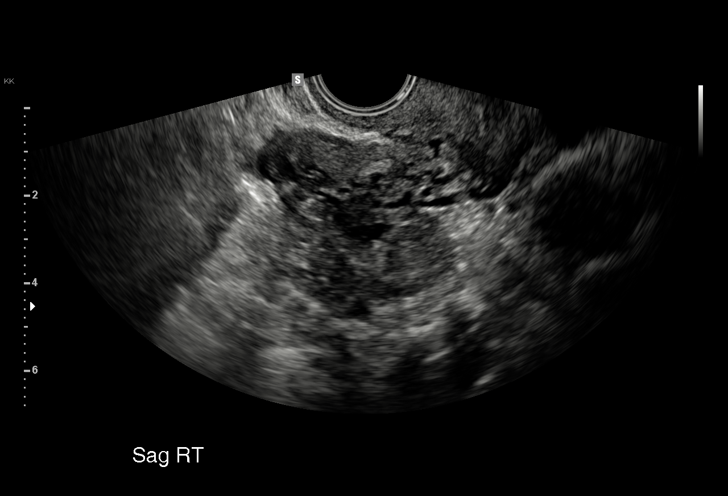
[im 73/79]
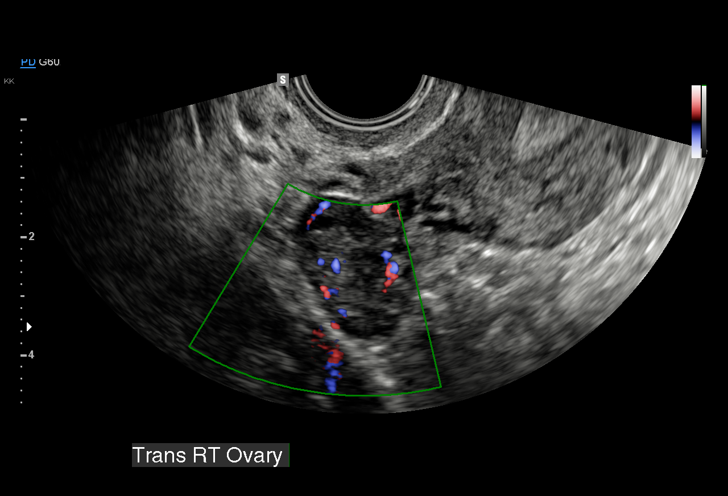
[im 79/79]
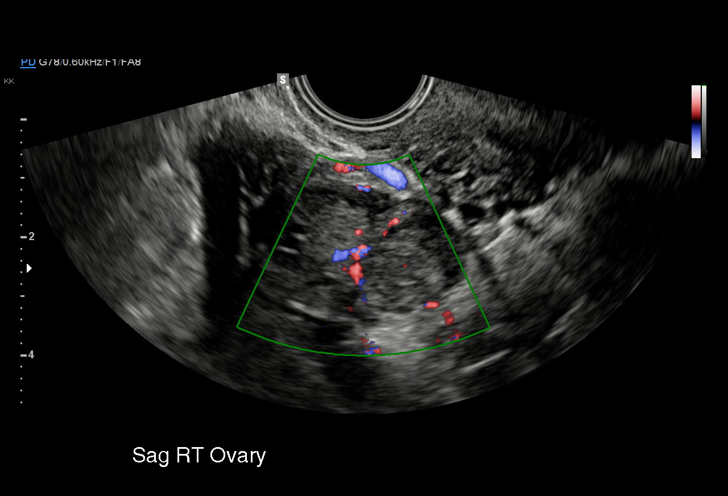

[15 of 28 positions shown; findings below may reference images not displayed]

FINDINGS: Intrauterine gestational sac: Visualized

Yolk sac:  Not visualized

Embryo:  Not visualized

Cardiac Activity: Not visualized

MSD: 4 mm  mm   5 w   1  d

Subchorionic hemorrhage:  None visualized.

Maternal uterus/adnexae: Maternal ovaries are normal in size and
contour bilaterally. There is no extrauterine pelvic or adnexal
mass. A small amount of free pelvic fluid is noted.
IMPRESSION: Probable early intrauterine gestational sac, but no yolk sac, fetal
pole, or cardiac activity yet visualized. Recommend follow-up
quantitative B-HCG levels and follow-up US in 14 days to confirm and
assess viability. This recommendation follows SRU consensus
guidelines: Diagnostic Criteria for Nonviable Pregnancy Early in the
First Trimester. N Engl J Med 9788; [DATE]. Based on
gestational sac size, estimated gestational age is approximately 5
weeks. Small amount of free maternal pelvic fluid is noted. No
extrauterine pelvic or adnexal masses are identified by ultrasound.

## 2017-10-27 IMAGING — US US OB TRANSVAGINAL
1 series · 15 of 28 positions shown · non-contrast
Comparison: 08/16/2015

CLINICAL DATA: 24-year-old pregnant female with right pelvic pain
and spotting for 4 days.

EXAM:
TRANSVAGINAL OB ULTRASOUND
TECHNIQUE: Transvaginal ultrasound was performed for complete evaluation of the
gestation as well as the maternal uterus, adnexal regions, and
pelvic cul-de-sac.

[Series 1: us ob transvaginal · 15 of 33 slices shown]
[im 1/33]
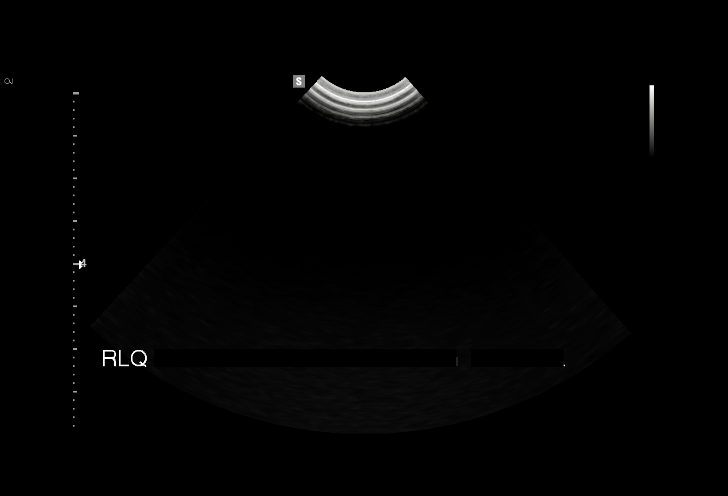
[im 3/33]
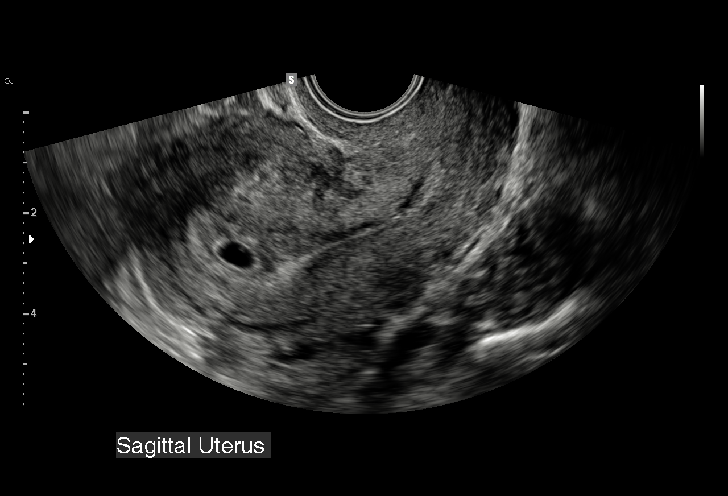
[im 5/33]
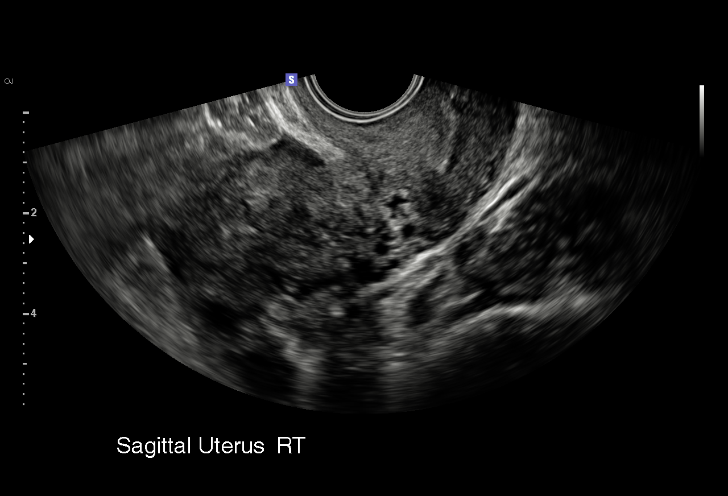
[im 8/33]
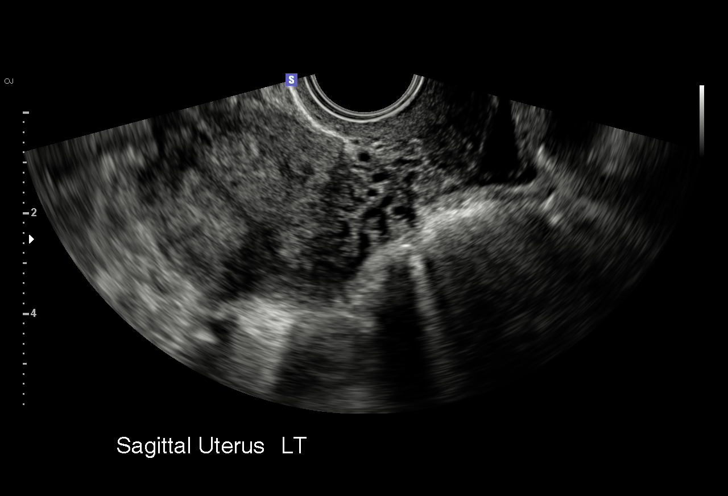
[im 10/33]
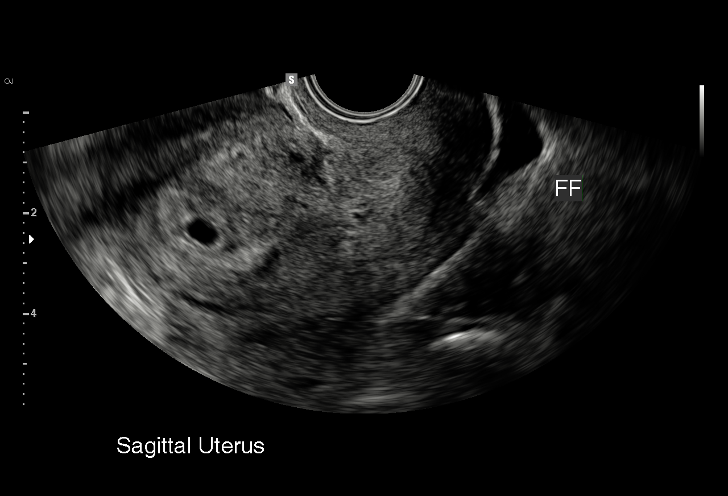
[im 12/33]
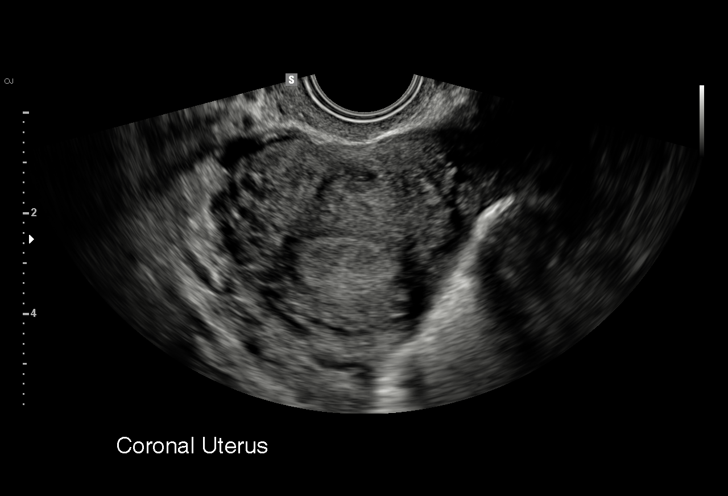
[im 15/33]
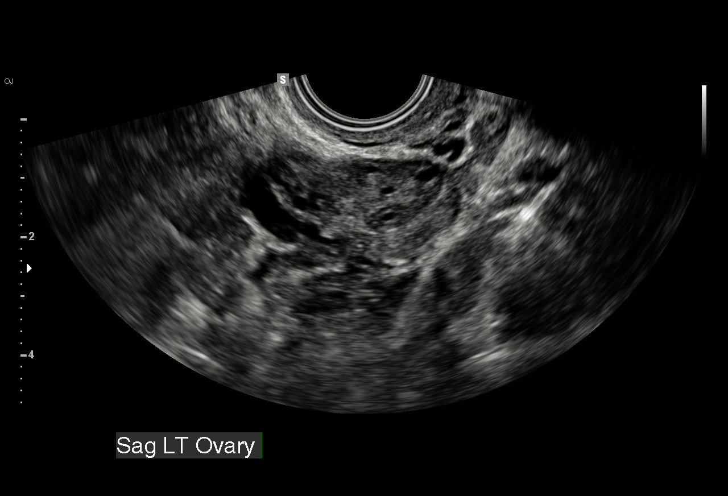
[im 17/33]
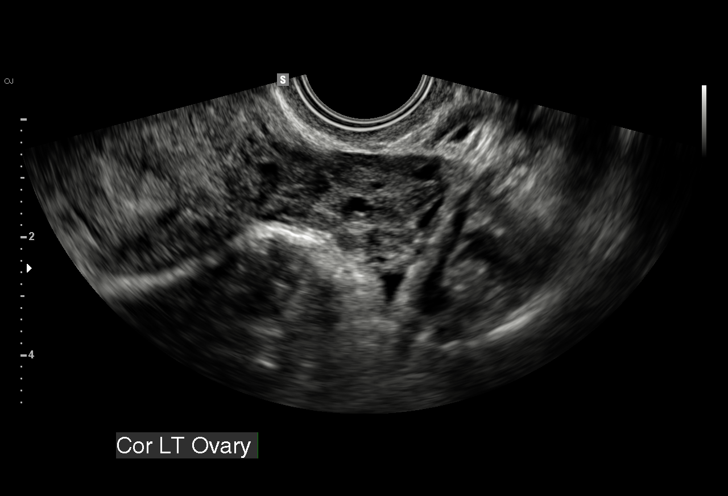
[im 18/33]
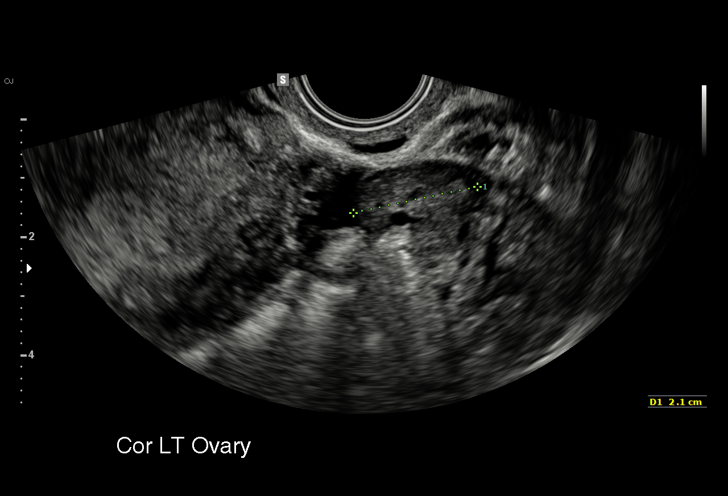
[im 21/33]
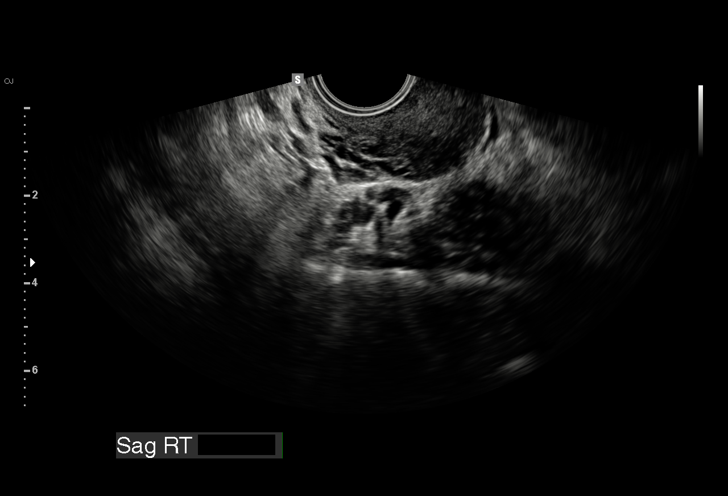
[im 23/33]
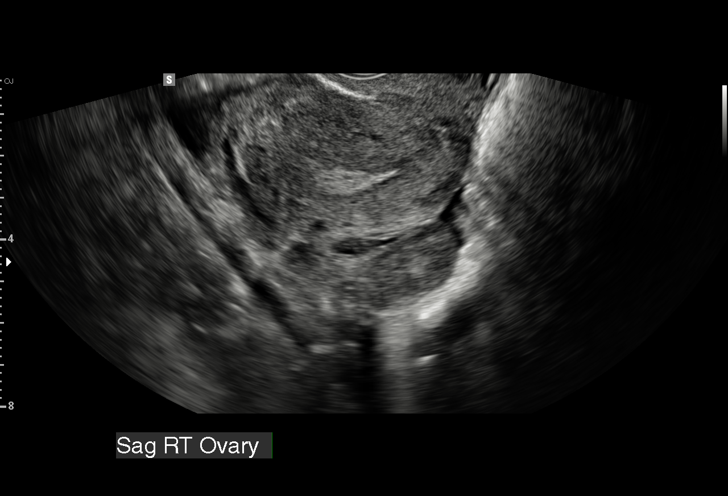
[im 25/33]
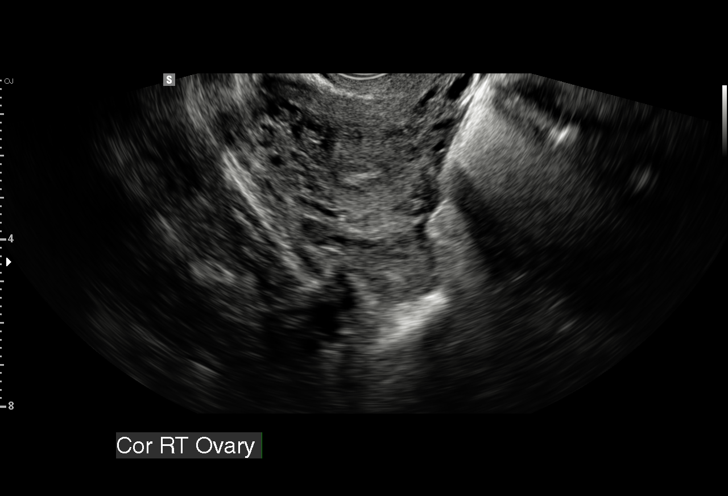
[im 28/33]
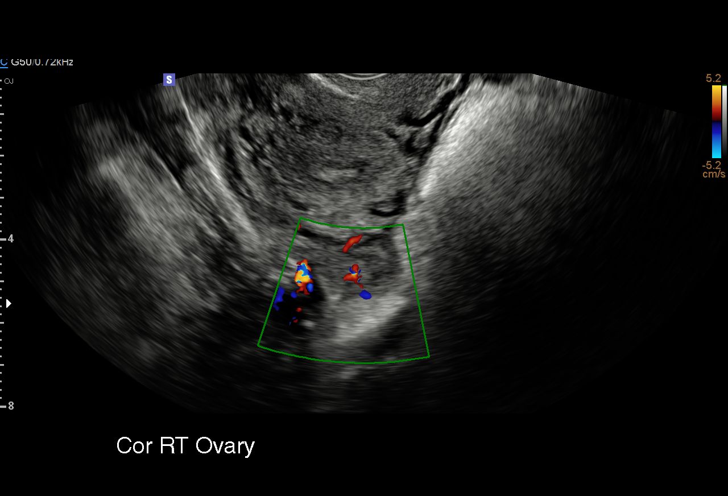
[im 30/33]
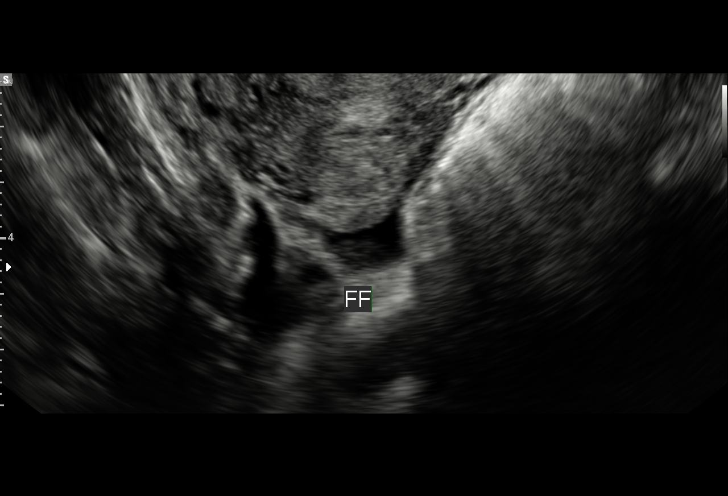
[im 33/33]
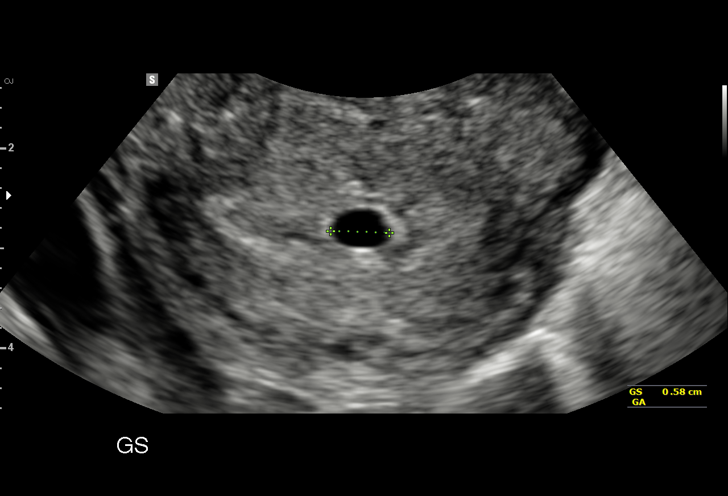

[15 of 28 positions shown; findings below may reference images not displayed]

FINDINGS: Intrauterine gestational sac: Visualized

Yolk sac:  Probable

Embryo:  Not visualized

Cardiac Activity: Not visualized

MSD: 5.3  mm   5 w   2  d             US EDC: 04/17/2016.

Subchorionic hemorrhage:  None visualized.

Maternal uterus/adnexae: The ovaries are unremarkable.

There is no evidence of adnexal mass.

A trace amount of free fluid is identified.
IMPRESSION: Probable intrauterine gestational sac containing likely yolk sac.
Fetal pole not identified at this time. This presumed intrauterine
gestational sac has appropriately enlarged 08/16/2015. Estimated
gestational age by this ultrasound is 5 weeks 2 days.

Recommend follow-up quantitative B-HCG levels and follow-up US in 14
days to confirm and assess viability. This recommendation follows
SRU consensus guidelines: Diagnostic Criteria for Nonviable
Pregnancy Early in the First Trimester. N Engl J Med 0249;

## 2017-10-31 ENCOUNTER — Ambulatory Visit (HOSPITAL_COMMUNITY)
Admission: RE | Admit: 2017-10-31 | Discharge: 2017-10-31 | Disposition: A | Discharge status: Home / Self Care | Payer: Medicaid Other | Source: Ambulatory Visit | Attending: Advanced Practice Midwife | Admitting: Advanced Practice Midwife

## 2017-10-31 ENCOUNTER — Other Ambulatory Visit: Payer: Self-pay | Admitting: Advanced Practice Midwife

## 2017-10-31 ENCOUNTER — Encounter (HOSPITAL_COMMUNITY): Payer: Self-pay

## 2017-10-31 ENCOUNTER — Other Ambulatory Visit (HOSPITAL_COMMUNITY): Payer: Self-pay | Admitting: *Deleted

## 2017-10-31 DIAGNOSIS — Z363 Encounter for antenatal screening for malformations: Secondary | ICD-10-CM | POA: Insufficient documentation

## 2017-10-31 DIAGNOSIS — O09213 Supervision of pregnancy with history of pre-term labor, third trimester: Secondary | ICD-10-CM | POA: Diagnosis not present

## 2017-10-31 DIAGNOSIS — Z362 Encounter for other antenatal screening follow-up: Secondary | ICD-10-CM

## 2017-10-31 DIAGNOSIS — Z3A30 30 weeks gestation of pregnancy: Secondary | ICD-10-CM | POA: Insufficient documentation

## 2017-10-31 DIAGNOSIS — O0993 Supervision of high risk pregnancy, unspecified, third trimester: Secondary | ICD-10-CM

## 2017-10-31 DIAGNOSIS — O09293 Supervision of pregnancy with other poor reproductive or obstetric history, third trimester: Secondary | ICD-10-CM | POA: Diagnosis not present

## 2017-11-13 ENCOUNTER — Other Ambulatory Visit: Payer: Self-pay

## 2017-11-15 ENCOUNTER — Ambulatory Visit (INDEPENDENT_AMBULATORY_CARE_PROVIDER_SITE_OTHER): Payer: Medicaid Other | Admitting: Obstetrics & Gynecology

## 2017-11-15 ENCOUNTER — Other Ambulatory Visit: Payer: Medicaid Other

## 2017-11-15 VITALS — BP 129/81 | HR 66 | Wt 171.0 lb

## 2017-11-15 DIAGNOSIS — O99210 Obesity complicating pregnancy, unspecified trimester: Secondary | ICD-10-CM

## 2017-11-15 DIAGNOSIS — O0993 Supervision of high risk pregnancy, unspecified, third trimester: Secondary | ICD-10-CM | POA: Diagnosis present

## 2017-11-15 DIAGNOSIS — A601 Herpesviral infection of perianal skin and rectum: Secondary | ICD-10-CM

## 2017-11-15 DIAGNOSIS — O09213 Supervision of pregnancy with history of pre-term labor, third trimester: Secondary | ICD-10-CM

## 2017-11-15 DIAGNOSIS — O09219 Supervision of pregnancy with history of pre-term labor, unspecified trimester: Secondary | ICD-10-CM

## 2017-11-15 DIAGNOSIS — O09899 Supervision of other high risk pregnancies, unspecified trimester: Secondary | ICD-10-CM

## 2017-11-15 DIAGNOSIS — O0990 Supervision of high risk pregnancy, unspecified, unspecified trimester: Secondary | ICD-10-CM

## 2017-11-15 DIAGNOSIS — O99213 Obesity complicating pregnancy, third trimester: Secondary | ICD-10-CM

## 2017-11-15 DIAGNOSIS — Z8759 Personal history of other complications of pregnancy, childbirth and the puerperium: Secondary | ICD-10-CM

## 2017-11-15 MED ORDER — VALACYCLOVIR HCL 500 MG PO TABS: 500.0000 mg | ORAL_TABLET | Freq: Two times a day (BID) | ORAL | 6 refills | Status: DC

## 2017-11-15 NOTE — Progress Notes (Signed)
   PRENATAL VISIT NOTE  Subjective:  Joy Rivera is a 27 y.o. [phone] at [redacted]w[redacted]d being seen today for ongoing prenatal care.  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  Patient reports no complaints.  Contractions: Not present. Vag. Bleeding: None.  Movement: Present. Denies leaking of fluid.   The following portions of the patient's history were reviewed and updated as appropriate: allergies, current medications, past family history, past medical history, past social history, past surgical history and problem list. Problem list updated.  Objective:   Vitals:   11/15/17 1104  BP: 129/81  Pulse: 66  Weight: 171 lb (77.6 kg)    Fetal Status: Fetal Heart Rate (bpm): 154   Movement: Present     General:  Alert, oriented and cooperative. Patient is in no acute distress.  Skin: Skin is warm and dry. No rash noted.   Cardiovascular: Normal heart rate noted  Respiratory: Normal respiratory effort, no problems with respiration noted  Abdomen: Soft, gravid, appropriate for gestational age.  Pain/Pressure: Present     Pelvic: Cervical exam performed        Extremities: Normal range of motion.  Edema: None  Mental Status: Normal mood and affect. Normal behavior. Normal judgment and thought content.   Assessment and Plan:  Pregnancy: [id number] at [redacted]w[redacted]d  1. Supervision of high risk pregnancy, antepartum, third trimester   2. History of pre-eclampsia with severe features  - on baby asa  3. History of preterm delivery, currently pregnant - PTL precautions  4. Herpes simplex infection of perianal  skin -start valtrex now  5. Obesity in pregnancy -2 hour GTT today - Hemoglobin A1c    Preterm labor symptoms and general obstetric precautions including but not limited to vaginal bleeding, contractions, leaking of fluid and fetal movement were reviewed in detail with the patient. Please refer to After Visit Summary for other counseling recommendations.  Return in about 2 weeks (around 11/29/2017).  Future Appointments  Date Time Provider Department Center  11/28/2017 10:15 AM [facility] [place] 4 [facility]    Allie Bossier, MD

## 2017-11-16 LAB — GLUCOSE TOLERANCE, 2 HOURS W/ 1HR
GLUCOSE, FASTING: 74 mg/dL (ref 65–91)
Glucose, 1 hour: 109 mg/dL (ref 65–179)
Glucose, 2 hour: 92 mg/dL (ref 65–152)

## 2017-11-16 LAB — HEMOGLOBIN A1C
ESTIMATED AVERAGE GLUCOSE: 111 mg/dL
HEMOGLOBIN A1C: 5.5 % (ref 4.8–5.6)

## 2017-11-28 ENCOUNTER — Encounter (HOSPITAL_COMMUNITY): Payer: Self-pay

## 2017-11-28 ENCOUNTER — Other Ambulatory Visit (HOSPITAL_COMMUNITY): Payer: Self-pay | Admitting: *Deleted

## 2017-11-28 ENCOUNTER — Ambulatory Visit (HOSPITAL_COMMUNITY)
Admission: RE | Admit: 2017-11-28 | Discharge: 2017-11-28 | Disposition: A | Discharge status: Home / Self Care | Payer: Medicaid Other | Source: Ambulatory Visit | Attending: Advanced Practice Midwife | Admitting: Advanced Practice Midwife

## 2017-11-28 DIAGNOSIS — Z362 Encounter for other antenatal screening follow-up: Secondary | ICD-10-CM | POA: Insufficient documentation

## 2017-11-28 DIAGNOSIS — Z3A34 34 weeks gestation of pregnancy: Secondary | ICD-10-CM | POA: Insufficient documentation

## 2017-11-28 DIAGNOSIS — O09213 Supervision of pregnancy with history of pre-term labor, third trimester: Secondary | ICD-10-CM | POA: Diagnosis not present

## 2017-11-28 DIAGNOSIS — O09293 Supervision of pregnancy with other poor reproductive or obstetric history, third trimester: Secondary | ICD-10-CM | POA: Insufficient documentation

## 2017-12-04 ENCOUNTER — Ambulatory Visit (INDEPENDENT_AMBULATORY_CARE_PROVIDER_SITE_OTHER): Payer: Medicaid Other | Admitting: Family Medicine

## 2017-12-04 VITALS — BP 124/61 | HR 89 | Wt 184.0 lb

## 2017-12-04 DIAGNOSIS — O09219 Supervision of pregnancy with history of pre-term labor, unspecified trimester: Secondary | ICD-10-CM

## 2017-12-04 DIAGNOSIS — O0993 Supervision of high risk pregnancy, unspecified, third trimester: Secondary | ICD-10-CM

## 2017-12-04 DIAGNOSIS — O0990 Supervision of high risk pregnancy, unspecified, unspecified trimester: Secondary | ICD-10-CM

## 2017-12-04 DIAGNOSIS — O09213 Supervision of pregnancy with history of pre-term labor, third trimester: Secondary | ICD-10-CM | POA: Diagnosis not present

## 2017-12-04 DIAGNOSIS — K219 Gastro-esophageal reflux disease without esophagitis: Secondary | ICD-10-CM | POA: Diagnosis not present

## 2017-12-04 DIAGNOSIS — O99613 Diseases of the digestive system complicating pregnancy, third trimester: Secondary | ICD-10-CM | POA: Diagnosis not present

## 2017-12-04 DIAGNOSIS — O99619 Diseases of the digestive system complicating pregnancy, unspecified trimester: Secondary | ICD-10-CM

## 2017-12-04 DIAGNOSIS — Z8759 Personal history of other complications of pregnancy, childbirth and the puerperium: Secondary | ICD-10-CM

## 2017-12-04 DIAGNOSIS — O09899 Supervision of other high risk pregnancies, unspecified trimester: Secondary | ICD-10-CM

## 2017-12-04 DIAGNOSIS — A601 Herpesviral infection of perianal skin and rectum: Secondary | ICD-10-CM

## 2017-12-04 MED ORDER — FAMOTIDINE 20 MG PO TABS: 20.0000 mg | ORAL_TABLET | Freq: Two times a day (BID) | ORAL | 2 refills | Status: DC

## 2017-12-04 NOTE — Patient Instructions (Signed)
Heartburn During Pregnancy Heartburn is pain or discomfort in the throat or chest. It may cause a burning feeling. It happens when stomach acid moves up into the tube that carries food from your mouth to your stomach (esophagus). Heartburn is common during pregnancy. It usually goes away or gets better after giving birth. Follow these instructions at home: Eating and drinking  Do not drink alcohol while you are pregnant.  Figure out which foods and beverages make you feel worse, and avoid them.  Beverages that you may want to avoid include: ? Coffee and tea (with or without caffeine). ? Energy drinks and sports drinks. ? Bubbly (carbonated) drinks or sodas. ? Citrus fruit juices.  Foods that you may want to avoid include: ? Chocolate and cocoa. ? Peppermint and mint flavorings. ? Garlic, onions, and horseradish. ? Spicy and acidic foods. These include peppers, chili powder, curry powder, vinegar, hot sauces, and barbecue sauce. ? Citrus fruits, such as oranges, lemons, and limes. ? Tomato-based foods, such as red sauce, chili, and salsa. ? Fried and fatty foods, such as donuts, french fries, potato chips, and high-fat dressings. ? High-fat meats, such as hot dogs, cold cuts, sausage, ham, and bacon. ? High-fat dairy items, such as whole milk, butter, and cheese.  Eat small meals often, instead of large meals.  Avoid drinking a lot of liquid with your meals.  Avoid eating meals during the 2-3 hours before you go to bed.  Avoid lying down right after you eat.  Do not exercise right after you eat. Medicines  Take over-the-counter and prescription medicines only as told by your doctor.  Do not take aspirin, ibuprofen, or other NSAIDs unless your doctor tells you to do that.  Your doctor may tell you to avoid medicines that have sodium bicarbonate in them. General instructions  If told, raise the head of your bed about 6 inches (15 cm). You can do this by putting blocks under  the legs. Sleeping with more pillows does not help with heartburn.  Do not use any products that contain nicotine or tobacco, such as cigarettes and e-cigarettes. If you need help quitting, ask your doctor.  Wear loose-fitting clothing.  Try to lower your stress, such as with yoga or meditation. If you need help, ask your doctor.  Stay at a healthy weight. If you are overweight, work with your doctor to safely lose weight.  Keep all follow-up visits as told by your doctor. This is important. Contact a doctor if:  You get new symptoms.  Your symptoms do not get better with treatment.  You have weight loss and you do not know why.  You have trouble swallowing.  You make loud sounds when you breathe (wheeze).  You have a cough that does not go away.  You have heartburn often for more than 2 weeks.  You feel sick to your stomach (nauseous), and this does not get better with treatment.  You are throwing up (vomiting), and this does not get better with treatment.  You have pain in your belly (abdomen). Get help right away if:  You have very bad chest pain that spreads to your arm, neck, or jaw.  You feel sweaty, dizzy, or light-headed.  You have trouble breathing.  You have pain when swallowing.  You throw up and your throw-up looks like blood or coffee grounds.  Your poop (stool) is bloody or black. This information is not intended to replace advice given to you by your health care provider.   Make sure you discuss any questions you have with your health care provider. Document Released: 02/24/2010 Document Revised: 10/10/2015 Document Reviewed: 10/10/2015 Elsevier Interactive Patient Education  2017 [employer] Inc.  

## 2017-12-04 NOTE — Progress Notes (Signed)
Subjective:  Joy Rivera is a 27 y.o. [phone] at [redacted]w[redacted]d being seen today for ongoing prenatal care.  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  Patient reports headache, heartburn, no bleeding, no leaking and occasional contractions. No relief of reflux symptoms with Nexium or Zantac. Has tried one medication in the past that relieved her symptoms entirely but pt unsure what medication it was. Over past week has noticed clear, yellow odorless mucous discharge. No leaking fluid, vaginal itching, or pain. Intermittent headache that is mildly relieved with Tylenol and rest. No visual changes, abdominal pain, or severe LE edema. Feels baby moving. +Braxton Hicks contractions. No vaginal bleeding.  Denies leaking of fluid. Taking valtrex and ASA daily.  The following portions of the patient's history were reviewed and updated as appropriate: allergies, current medications, past family history, past medical history, past social history, past surgical history and problem list. Problem list updated.  Objective:   Vitals:   12/04/17 1150  BP: 124/61  Pulse: 89  Weight: 184 lb (83.5 kg)    Fetal Status: Fetal Heart Rate (bpm): 136 Fundal Height: 36 cm Movement: Present     General:  Alert, oriented and cooperative. Patient is in no acute distress.  Skin: Skin is warm and dry. No rash noted.   Cardiovascular: Normal heart rate noted  Respiratory: Normal respiratory effort, no problems with respiration noted  Abdomen: Soft, gravid, appropriate for gestational age. Pain/Pressure: Present     Pelvic: Vag. Bleeding:  None     Cervical exam deferred        Extremities: Normal range of motion.  Edema: None  Mental Status: Normal mood and affect. Normal behavior. Normal judgment and thought content.   Urinalysis:  Collected today. Results pending.  Assessment and Plan:  Pregnancy: [id number] at [redacted]w[redacted]d  1. Supervision of high risk pregnancy, antepartum - One-week history of intermittent clear, yellow odorless vaginal discharge with no associated vaginal itching, bleeding, pain, or urinary symptoms likely mucus plug being expelled. CTM and reevaluate if symptoms change or worsen. - Check GBS status at next visit  2. History of pre-eclampsia with severe features  BP has been consistently 120s systolics/60s-80s diastolic throughout pregnancy and last urine protein/Cr ratio WNL. Although pt has had occasional headaches recently, given lack of visual changes, worsening LE edema, or abdominal pain, less concerned for pre eclampsia at this time. Given pt's history of pre-eclampsia and mildly elevated BP today, will recheck urine and collect labs at her next visit in 1-week.  - Protein / creatinine ratio, urine today: Pending - CMP and CBC at next visit - Continue ASA 81 mcg daily and CTM for signs/symptoms of pre-eclampsia  3. Herpes simplex infection of perianal skin - Continue valtrex  4. Gastroesophageal reflux in pregnancy - Start pepcid 20 mg table twice daily - Will reassess for resolution of symptoms at next visit   Preterm labor symptoms and general obstetric precautions including but not limited to vaginal bleeding, contractions, leaking of fluid and fetal movement were reviewed in detail with the patient. Please refer to After Visit Summary for other counseling recommendations.  Return in about 1 week (around 12/11/2017) for HROB.   Hal Neer,  Medical Student  Attestation: I have seen this patient and agree with the medical student's documentation. I have examined them separately, and we have  discussed the plan of care.  Cristal Deer. Earlene Plater, DO OB/GYN Fellow

## 2017-12-16 ENCOUNTER — Telehealth: Payer: Self-pay | Admitting: *Deleted

## 2017-12-16 ENCOUNTER — Encounter (HOSPITAL_COMMUNITY): Payer: Self-pay

## 2017-12-16 ENCOUNTER — Inpatient Hospital Stay (HOSPITAL_COMMUNITY)
Admission: AD | Admit: 2017-12-16 | Discharge: 2017-12-17 | Disposition: A | Discharge status: Home / Self Care | Payer: Medicaid Other | Source: Ambulatory Visit | Attending: Obstetrics and Gynecology | Admitting: Obstetrics and Gynecology

## 2017-12-16 DIAGNOSIS — O09293 Supervision of pregnancy with other poor reproductive or obstetric history, third trimester: Secondary | ICD-10-CM | POA: Insufficient documentation

## 2017-12-16 DIAGNOSIS — O368330 Maternal care for abnormalities of the fetal heart rate or rhythm, third trimester, not applicable or unspecified: Secondary | ICD-10-CM | POA: Insufficient documentation

## 2017-12-16 DIAGNOSIS — O479 False labor, unspecified: Secondary | ICD-10-CM

## 2017-12-16 DIAGNOSIS — O09213 Supervision of pregnancy with history of pre-term labor, third trimester: Secondary | ICD-10-CM | POA: Insufficient documentation

## 2017-12-16 DIAGNOSIS — Z3A37 37 weeks gestation of pregnancy: Secondary | ICD-10-CM | POA: Insufficient documentation

## 2017-12-16 DIAGNOSIS — O368390 Maternal care for abnormalities of the fetal heart rate or rhythm, unspecified trimester, not applicable or unspecified: Secondary | ICD-10-CM

## 2017-12-16 NOTE — Telephone Encounter (Signed)
Received a voice message from 12/13/17 12:46pm stating she is [redacted] weeks pregnant and she guessed baby is sitting on a nerve because it feels like she can hardly walk. States not sure what to do and would like a call.

## 2017-12-16 NOTE — MAU Note (Addendum)
Pt reports a lot of pelvic pressure and irregular contractions that started tonight. Pt denies vaginal bleeding or LOF. Reports good fetal movement. 1.5cm on last SVE

## 2017-12-17 ENCOUNTER — Other Ambulatory Visit: Payer: Self-pay

## 2017-12-17 ENCOUNTER — Inpatient Hospital Stay (HOSPITAL_COMMUNITY)
Admission: AD | Admit: 2017-12-17 | Discharge: 2017-12-17 | Disposition: A | Discharge status: Home / Self Care | Payer: Medicaid Other | Source: Ambulatory Visit | Attending: Family Medicine | Admitting: Family Medicine

## 2017-12-17 ENCOUNTER — Encounter (HOSPITAL_COMMUNITY): Payer: Self-pay | Admitting: *Deleted

## 2017-12-17 ENCOUNTER — Inpatient Hospital Stay (HOSPITAL_BASED_OUTPATIENT_CLINIC_OR_DEPARTMENT_OTHER): Payer: Medicaid Other

## 2017-12-17 DIAGNOSIS — Z3A37 37 weeks gestation of pregnancy: Secondary | ICD-10-CM

## 2017-12-17 DIAGNOSIS — O09293 Supervision of pregnancy with other poor reproductive or obstetric history, third trimester: Secondary | ICD-10-CM | POA: Diagnosis not present

## 2017-12-17 DIAGNOSIS — O368390 Maternal care for abnormalities of the fetal heart rate or rhythm, unspecified trimester, not applicable or unspecified: Secondary | ICD-10-CM | POA: Diagnosis not present

## 2017-12-17 DIAGNOSIS — O0990 Supervision of high risk pregnancy, unspecified, unspecified trimester: Secondary | ICD-10-CM

## 2017-12-17 DIAGNOSIS — O0993 Supervision of high risk pregnancy, unspecified, third trimester: Secondary | ICD-10-CM

## 2017-12-17 DIAGNOSIS — O09213 Supervision of pregnancy with history of pre-term labor, third trimester: Secondary | ICD-10-CM

## 2017-12-17 DIAGNOSIS — O368330 Maternal care for abnormalities of the fetal heart rate or rhythm, third trimester, not applicable or unspecified: Secondary | ICD-10-CM | POA: Diagnosis not present

## 2017-12-17 NOTE — MAU Note (Signed)
ccame in yesterday for contractions and pressure.  Now 2 min apart, getting worse.  Having a sharp pain in her back. No bleeding or leaking, still passing mucous plug.was 1+ cm

## 2017-12-17 NOTE — MAU Note (Signed)
I have communicated with Dr. Jolene Schimke. Joy Rivera and reviewed vital signs:  Vitals:   12/16/17 2340  BP: 124/76  Pulse: 83  Resp: 18  Temp: 97.9 F (36.6 C)  SpO2: 100%    Vaginal exam:  Dilation: 1.5 Effacement (%): 50 Cervical Position: Middle Station: -2 Presentation: Vertex Exam by:: Camelia Eng RN,   Also reviewed contraction pattern and that non-stress test is reactive.  It has been documented that patient is contracting every 5-6 minutes with no cervical change over 2 hours not indicating active labor.  Patient denies any other complaints.  Based on this report provider has given order for discharge.  A discharge order and diagnosis entered by a provider. Fetal tracing and BPP results reviewed by provider. Labor discharge instructions reviewed with patient.

## 2017-12-17 NOTE — Discharge Instructions (Signed)
Braxton Hicks Contractions °Contractions of the uterus can occur throughout pregnancy, but they are not always a sign that you are in labor. You may have practice contractions called Braxton Hicks contractions. These false labor contractions are sometimes confused with true labor. °What are Braxton Hicks contractions? °Braxton Hicks contractions are tightening movements that occur in the muscles of the uterus before labor. Unlike true labor contractions, these contractions do not result in opening (dilation) and thinning of the cervix. Toward the end of pregnancy (32-34 weeks), Braxton Hicks contractions can happen more often and may become stronger. These contractions are sometimes difficult to tell apart from true labor because they can be very uncomfortable. You should not feel embarrassed if you go to the hospital with false labor. °Sometimes, the only way to tell if you are in true labor is for your health care provider to look for changes in the cervix. The health care provider will do a physical exam and may monitor your contractions. If you are not in true labor, the exam should show that your cervix is not dilating and your water has not broken. °If there are other health problems associated with your pregnancy, it is completely safe for you to be sent home with false labor. You may continue to have Braxton Hicks contractions until you go into true labor. °How to tell the difference between true labor and false labor °True labor °· Contractions last 30-70 seconds. °· Contractions become very regular. °· Discomfort is usually felt in the top of the uterus, and it spreads to the lower abdomen and low back. °· Contractions do not go away with walking. °· Contractions usually become more intense and increase in frequency. °· The cervix dilates and gets thinner. °False labor °· Contractions are usually shorter and not as strong as true labor contractions. °· Contractions are usually irregular. °· Contractions  are often felt in the front of the lower abdomen and in the groin. °· Contractions may go away when you walk around or change positions while lying down. °· Contractions get weaker and are shorter-lasting as time goes on. °· The cervix usually does not dilate or become thin. °Follow these instructions at home: °· Take over-the-counter and prescription medicines only as told by your health care provider. °· Keep up with your usual exercises and follow other instructions from your health care provider. °· Eat and drink lightly if you think you are going into labor. °· If Braxton Hicks contractions are making you uncomfortable: °? Change your position from lying down or resting to walking, or change from walking to resting. °? Sit and rest in a tub of warm water. °? Drink enough fluid to keep your urine pale yellow. Dehydration may cause these contractions. °? Do slow and deep breathing several times an hour. °· Keep all follow-up prenatal visits as told by your health care provider. This is important. °Contact a health care provider if: °· You have a fever. °· You have continuous pain in your abdomen. °Get help right away if: °· Your contractions become stronger, more regular, and closer together. °· You have fluid leaking or gushing from your vagina. °· You pass blood-tinged mucus (bloody show). °· You have bleeding from your vagina. °· You have low back pain that you never had before. °· You feel your baby’s head pushing down and causing pelvic pressure. °· Your baby is not moving inside you as much as it used to. °Summary °· Contractions that occur before labor are called Braxton   Hicks contractions, false labor, or practice contractions. °· Braxton Hicks contractions are usually shorter, weaker, farther apart, and less regular than true labor contractions. True labor contractions usually become progressively stronger and regular and they become more frequent. °· Manage discomfort from Braxton Hicks contractions by  changing position, resting in a warm bath, drinking plenty of water, or practicing deep breathing. °This information is not intended to replace advice given to you by your health care provider. Make sure you discuss any questions you have with your health care provider. °Document Released: 06/07/2016 Document Revised: 06/07/2016 Document Reviewed: 06/07/2016 °Elsevier Interactive Patient Education © 2018 [employer] Inc[employer] ° °Fetal Movement Counts °Patient Name: ________________________________________________ Patient Due Date: ____________________ °What is a fetal movement count? °A fetal movement count is the number of times that you feel your baby move during a certain amount of time. This may also be called a fetal kick count. A fetal movement count is recommended for every pregnant woman. You may be asked to start counting fetal movements as early as week 28 of your pregnancy. °Pay attention to when your baby is most active. You may notice your baby's sleep and wake cycles. You may also notice things that make your baby move more. You should do a fetal movement count: °· When your baby is normally most active. °· At the same time each day. ° °A good time to count movements is while you are resting, after having something to eat and drink. °How do I count fetal movements? °1. Find a quiet, comfortable area. Sit, or lie down on your side. °2. Write down the date, the start time and stop time, and the number of movements that you felt between those two times. Take this information with you to your health care visits. °3. For 2 hours, count kicks, flutters, swishes, rolls, and jabs. You should feel at least 10 movements during 2 hours. °4. You may stop counting after you have felt 10 movements. °5. If you do not feel 10 movements in 2 hours, have something to eat and drink. Then, keep resting and counting for 1 hour. If you feel at least 4 movements during that hour, you may stop counting. °Contact a health care  provider if: °· You feel fewer than 4 movements in 2 hours. °· Your baby is not moving like he or she usually does. °Date: ____________ Start time: ____________ Stop time: ____________ Movements: ____________ °Date: ____________ Start time: ____________ Stop time: ____________ Movements: ____________ °Date: ____________ Start time: ____________ Stop time: ____________ Movements: ____________ °Date: ____________ Start time: ____________ Stop time: ____________ Movements: ____________ °Date: ____________ Start time: ____________ Stop time: ____________ Movements: ____________ °Date: ____________ Start time: ____________ Stop time: ____________ Movements: ____________ °Date: ____________ Start time: ____________ Stop time: ____________ Movements: ____________ °Date: ____________ Start time: ____________ Stop time: ____________ Movements: ____________ °Date: ____________ Start time: ____________ Stop time: ____________ Movements: ____________ °This information is not intended to replace advice given to you by your health care provider. Make sure you discuss any questions you have with your health care provider. °Document Released: 02/21/2006 Document Revised: 09/21/2015 Document Reviewed: 03/03/2015 °Elsevier Interactive Patient Education © 2018 [employer] Inc[employer] ° °

## 2017-12-18 NOTE — Telephone Encounter (Signed)
Called pt to see how she was feeling, LVM to call us if she needed anything.

## 2017-12-19 ENCOUNTER — Other Ambulatory Visit: Payer: Self-pay

## 2017-12-19 ENCOUNTER — Inpatient Hospital Stay (EMERGENCY_DEPARTMENT_HOSPITAL)
Admission: AD | Admit: 2017-12-19 | Discharge: 2017-12-19 | Disposition: A | Discharge status: Home / Self Care | Payer: Medicaid Other | Source: Ambulatory Visit | Attending: Obstetrics & Gynecology | Admitting: Obstetrics & Gynecology

## 2017-12-19 ENCOUNTER — Encounter (HOSPITAL_COMMUNITY): Payer: Self-pay | Admitting: Emergency Medicine

## 2017-12-19 ENCOUNTER — Telehealth: Payer: Self-pay | Admitting: *Deleted

## 2017-12-19 DIAGNOSIS — R030 Elevated blood-pressure reading, without diagnosis of hypertension: Secondary | ICD-10-CM

## 2017-12-19 DIAGNOSIS — O26893 Other specified pregnancy related conditions, third trimester: Secondary | ICD-10-CM

## 2017-12-19 DIAGNOSIS — Z3A37 37 weeks gestation of pregnancy: Secondary | ICD-10-CM | POA: Insufficient documentation

## 2017-12-19 DIAGNOSIS — R51 Headache: Secondary | ICD-10-CM

## 2017-12-19 DIAGNOSIS — O479 False labor, unspecified: Secondary | ICD-10-CM

## 2017-12-19 DIAGNOSIS — O471 False labor at or after 37 completed weeks of gestation: Secondary | ICD-10-CM

## 2017-12-19 LAB — CBC
HCT: 33.7 % — ABNORMAL LOW (ref 36.0–46.0)
HEMOGLOBIN: 10.9 g/dL — AB (ref 12.0–15.0)
MCH: 26.1 pg (ref 26.0–34.0)
MCHC: 32.3 g/dL (ref 30.0–36.0)
MCV: 80.6 fL (ref 80.0–100.0)
PLATELETS: 225 10*3/uL (ref 150–400)
RBC: 4.18 MIL/uL (ref 3.87–5.11)
RDW: 13.7 % (ref 11.5–15.5)
WBC: 5.7 10*3/uL (ref 4.0–10.5)
nRBC: 0 % (ref 0.0–0.2)

## 2017-12-19 LAB — URINALYSIS, ROUTINE W REFLEX MICROSCOPIC
Bilirubin Urine: NEGATIVE
GLUCOSE, UA: NEGATIVE mg/dL
HGB URINE DIPSTICK: NEGATIVE
KETONES UR: NEGATIVE mg/dL
LEUKOCYTES UA: NEGATIVE
Nitrite: NEGATIVE
PH: 7 (ref 5.0–8.0)
PROTEIN: NEGATIVE mg/dL
Specific Gravity, Urine: 1.009 (ref 1.005–1.030)

## 2017-12-19 LAB — COMPREHENSIVE METABOLIC PANEL
ALK PHOS: 191 U/L — AB (ref 38–126)
ALT: 10 U/L (ref 0–44)
ANION GAP: 7 (ref 5–15)
AST: 14 U/L — ABNORMAL LOW (ref 15–41)
Albumin: 2.7 g/dL — ABNORMAL LOW (ref 3.5–5.0)
BUN: 10 mg/dL (ref 6–20)
CALCIUM: 8.3 mg/dL — AB (ref 8.9–10.3)
CO2: 25 mmol/L (ref 22–32)
CREATININE: 0.76 mg/dL (ref 0.44–1.00)
Chloride: 104 mmol/L (ref 98–111)
Glucose, Bld: 75 mg/dL (ref 70–99)
Potassium: 3.9 mmol/L (ref 3.5–5.1)
Sodium: 136 mmol/L (ref 135–145)
TOTAL PROTEIN: 6.6 g/dL (ref 6.5–8.1)
Total Bilirubin: 0.5 mg/dL (ref 0.3–1.2)

## 2017-12-19 LAB — PROTEIN / CREATININE RATIO, URINE
CREATININE, URINE: 74 mg/dL
PROTEIN CREATININE RATIO: 0.09 mg/mg{creat} (ref 0.00–0.15)
Total Protein, Urine: 7 mg/dL

## 2017-12-19 MED ORDER — ACETAMINOPHEN 500 MG PO TABS
1000.0000 mg | ORAL_TABLET | Freq: Once | ORAL | Status: AC
  Administered 2017-12-19: 1000 mg via ORAL
  Filled 2017-12-19: qty 2

## 2017-12-19 NOTE — MAU Provider Note (Signed)
Chief Complaint  Patient presents with  . Labor Eval     First Provider Initiated Contact with Patient 12/19/17 1128      S: Rutherford Nailiffany A Carrier  is a 27 y.o. y.o. year old [phone] female at 5874w2d weeks gestation who presents to MAU as a labor evaluation. I was asked to see patient d/t elevated BPs. Patient has hx of preeclampsia in previous pregnancy but denies hypertension with this pregnancy.  Has had headache today. Hasn't treated symptoms. Denies visual disturbance or epigastric pain.  No vaginal bleeding or LOF. Positive fetal movement.    O:  Patient Vitals for the past 24 hrs:  BP Temp Temp src Pulse Resp SpO2 Height Weight  12/19/17 1318 125/85 98.5 F (36.9 C) Oral 77 17 100 % - -  12/19/17 1315 125/85 - - 77 - - - -  12/19/17 1300 127/83 - - 74 - - - -  12/19/17 1245 129/88 - - 64 - - - -  12/19/17 1230 134/83 - - (!) 55 - - - -  12/19/17 1216 139/87 - - 71 - - - -  12/19/17 1201 126/89 - - 79 - - - -  12/19/17 1146 (!) 131/91 - - 65 - - - -  12/19/17 1136 133/88 - - 80 - - - -  12/19/17 1121 (!) 126/91 - - 77 - - - -  12/19/17 1110 135/87 - - 83 - - - -  12/19/17 1044 (!) 143/85 98.3 F (36.8 C) - - 18 - 5\' 3"  (1.6 m) 82.6 kg   General: NAD Heart: Regular rate Lungs: Normal rate and effort Abd: Soft, NT, Gravid, S=D Extremities: no Pedal edema Neuro: 2+ deep tendon reflexes, No clonus  Dilation: 3 Effacement (%): 50 Cervical Position: Anterior Station: Web designerBallotable, -2 Presentation: Vertex Exam by:: Everlene FarrierHilary Hardy, RN  NST:  Baseline: 120 bpm, Variability: Good {> 6 bpm), Accelerations: Reactive and Decelerations: Absent  Results for orders placed or performed during the hospital encounter of 12/19/17 (from the past 24 hour(s))  Urinalysis, Routine w reflex microscopic     Status: Abnormal   Collection Time: 12/19/17 10:53 AM  Result Value Ref Range   Color, Urine STRAW (A) YELLOW   APPearance CLEAR CLEAR   Specific Gravity, Urine 1.009 1.005 - 1.030   pH  7.0 5.0 - 8.0   Glucose, UA NEGATIVE NEGATIVE mg/dL   Hgb urine dipstick NEGATIVE NEGATIVE   Bilirubin Urine NEGATIVE NEGATIVE   Ketones, ur NEGATIVE NEGATIVE mg/dL   Protein, ur NEGATIVE NEGATIVE mg/dL   Nitrite NEGATIVE NEGATIVE   Leukocytes, UA NEGATIVE NEGATIVE  Protein / creatinine ratio, urine     Status: None   Collection Time: 12/19/17 10:53 AM  Result Value Ref Range   Creatinine, Urine 74.00 mg/dL   Total Protein, Urine 7 mg/dL   Protein Creatinine Ratio 0.09 0.00 - 0.15 mg/mg[Cre]  CBC     Status: Abnormal   Collection Time: 12/19/17 11:29 AM  Result Value Ref Range   WBC 5.7 4.0 - 10.5 K/uL   RBC 4.18 3.87 - 5.11 MIL/uL   Hemoglobin 10.9 (L) 12.0 - 15.0 g/dL   HCT 45.433.7 (L) 09.836.0 - 11.946.0 %   MCV 80.6 80.0 - 100.0 fL   MCH 26.1 26.0 - 34.0 pg   MCHC 32.3 30.0 - 36.0 g/dL   RDW 14.713.7 82.911.5 - 56.215.5 %   Platelets 225 150 - 400 K/uL   nRBC 0.0 0.0 - 0.2 %  Comprehensive metabolic panel  Status: Abnormal   Collection Time: 12/19/17 11:29 AM  Result Value Ref Range   Sodium 136 135 - 145 mmol/L   Potassium 3.9 3.5 - 5.1 mmol/L   Chloride 104 98 - 111 mmol/L   CO2 25 22 - 32 mmol/L   Glucose, Bld 75 70 - 99 mg/dL   BUN 10 6 - 20 mg/dL   Creatinine, Ser 1.61 0.44 - 1.00 mg/dL   Calcium 8.3 (L) 8.9 - 10.3 mg/dL   Total Protein 6.6 6.5 - 8.1 g/dL   Albumin 2.7 (L) 3.5 - 5.0 g/dL   AST 14 (L) 15 - 41 U/L   ALT 10 0 - 44 U/L   Alkaline Phosphatase 191 (H) 38 - 126 U/L   Total Bilirubin 0.5 0.3 - 1.2 mg/dL   GFR calc non Af Amer >60 >60 mL/min   GFR calc Af Amer >60 >60 mL/min   Anion gap 7 5 - 15    A:  1. False labor  -SVE unchanged after 1+ hr of monitoring & irregular ctx  2. [redacted] weeks gestation of pregnancy   3. Elevated BP without diagnosis of hypertension  -some elevated BPs. None severe range. Headache completely resolved with tylenol.  -PEC labs normal  4. Pregnancy headache in third trimester       P:  Discharge home in stable condition BP check in  the office tomorrow to determine if GHTN Stressed return precautions esp r/t hypertension  Judeth Horn, NP 12/19/2017 8:06 PM

## 2017-12-19 NOTE — Telephone Encounter (Signed)
Joy Rivera called and left a voicemail early this am stating she thinks she is loosing her mucous plug for some time now but c/o today when she wipes is tinged with blood. Wants to know if she can be seen.  [facility] chart review [facility] leaving this message.

## 2017-12-19 NOTE — Discharge Instructions (Signed)
Hypertension During Pregnancy °Hypertension, commonly called high blood pressure, is when the force of blood pumping through your arteries is too strong. Arteries are blood vessels that carry blood from the heart throughout the body. Hypertension during pregnancy can cause problems for you and your baby. Your baby may be born early (prematurely) or may not weigh as much as he or she should at birth. Very bad cases of hypertension during pregnancy can be life-threatening. °Different types of hypertension can occur during pregnancy. These include: °· Chronic hypertension. This happens when: °? You have hypertension before pregnancy and it continues during pregnancy. °? You develop hypertension before you are [redacted] weeks pregnant, and it continues during pregnancy. °· Gestational hypertension. This is hypertension that develops after the 20th week of pregnancy. °· Preeclampsia, also called toxemia of pregnancy. This is a very serious type of hypertension that develops only during pregnancy. It affects the whole body, and it can be very dangerous for you and your baby. ° °Gestational hypertension and preeclampsia usually go away within 6 weeks after your baby is born. Women who have hypertension during pregnancy have a greater chance of developing hypertension later in life or during future pregnancies. °What are the causes? °The exact cause of hypertension is not known. °What increases the risk? °There are certain factors that make it more likely for you to develop hypertension during pregnancy. These include: °· Having hypertension during a previous pregnancy or prior to pregnancy. °· Being overweight. °· Being older than age 40. °· Being pregnant for the first time or being pregnant with more than one baby. °· Becoming pregnant using fertilization methods such as IVF (in vitro fertilization). °· Having diabetes, kidney problems, or systemic lupus erythematosus. °· Having a family history of hypertension. ° °What are the  signs or symptoms? °Chronic hypertension and gestational hypertension rarely cause symptoms. Preeclampsia causes symptoms, which may include: °· Increased protein in your urine. Your health care provider will check for this at every visit before you give birth (prenatal visit). °· Severe headaches. °· Sudden weight gain. °· Swelling of the hands, face, legs, and feet. °· Nausea and vomiting. °· Vision problems, such as blurred or double vision. °· Numbness in the face, arms, legs, and feet. °· Dizziness. °· Slurred speech. °· Sensitivity to bright lights. °· Abdominal pain. °· Convulsions. ° °How is this diagnosed? °You may be diagnosed with hypertension during a routine prenatal exam. At each prenatal visit, you may: °· Have a urine test to check for high amounts of protein in your urine. °· Have your blood pressure checked. A blood pressure reading is recorded as two numbers, such as "120 over 80" (or 120/80). The first ("top") number is called the systolic pressure. It is a measure of the pressure in your arteries when your heart beats. The second ("bottom") number is called the diastolic pressure. It is a measure of the pressure in your arteries as your heart relaxes between beats. Blood pressure is measured in a unit called mm Hg. A normal blood pressure reading is: °? Systolic: below 120. °? Diastolic: below 80. ° °The type of hypertension that you are diagnosed with depends on your test results and when your symptoms developed. °· Chronic hypertension is usually diagnosed before 20 weeks of pregnancy. °· Gestational hypertension is usually diagnosed after 20 weeks of pregnancy. °· Hypertension with high amounts of protein in the urine is diagnosed as preeclampsia. °· Blood pressure measurements that stay above 160 systolic, or above 110 diastolic, are   signs of severe preeclampsia. ° °How is this treated? °Treatment for hypertension during pregnancy varies depending on the type of hypertension you have and how  serious it is. °· If you take medicines called ACE inhibitors to treat chronic hypertension, you may need to switch medicines. ACE inhibitors should not be taken during pregnancy. °· If you have gestational hypertension, you may need to take blood pressure medicine. °· If you are at risk for preeclampsia, your health care provider may recommend that you take a low-dose aspirin every day to prevent high blood pressure during your pregnancy. °· If you have severe preeclampsia, you may need to be hospitalized so you and your baby can be monitored closely. You may also need to take medicine (magnesium sulfate) to prevent seizures and to lower blood pressure. This medicine may be given as an injection or through an IV tube. °· In some cases, if your condition gets worse, you may need to deliver your baby early. ° °Follow these instructions at home: °Eating and drinking °· Drink enough fluid to keep your urine clear or pale yellow. °· Eat a healthy diet that is low in salt (sodium). Do not add salt to your food. Check food labels to see how much sodium a food or beverage contains. °Lifestyle °· Do not use any products that contain nicotine or tobacco, such as cigarettes and e-cigarettes. If you need help quitting, ask your health care provider. °· Do not use alcohol. °· Avoid caffeine. °· Avoid stress as much as possible. Rest and get plenty of sleep. °General instructions °· Take over-the-counter and prescription medicines only as told by your health care provider. °· While lying down, lie on your left side. This keeps pressure off your baby. °· While sitting or lying down, raise (elevate) your feet. Try putting some pillows under your lower legs. °· Exercise regularly. Ask your health care provider what kinds of exercise are best for you. °· Keep all prenatal and follow-up visits as told by your health care provider. This is important. °Contact a health care provider if: °· You have symptoms that your health care  provider told you may require more treatment or monitoring, such as: °? Fever. °? Vomiting. °? Headache. °Get help right away if: °· You have severe abdominal pain or vomiting that does not get better with treatment. °· You suddenly develop swelling in your hands, ankles, or face. °· You gain 4 lbs (1.8 kg) or more in 1 week. °· You develop vaginal bleeding, or you have blood in your urine. °· You do not feel your baby moving as much as usual. °· You have blurred or double vision. °· You have muscle twitching or sudden tightening (spasms). °· You have shortness of breath. °· Your lips or fingernails turn blue. °This information is not intended to replace advice given to you by your health care provider. Make sure you discuss any questions you have with your health care provider. °Document Released: 10/10/2010 Document Revised: 08/12/2015 Document Reviewed: 07/08/2015 °Elsevier Interactive Patient Education © 2018 [employer] ° °Fetal Movement Counts °Patient Name: ________________________________________________ Patient Due Date: ____________________ °What is a fetal movement count? °A fetal movement count is the number of times that you feel your baby move during a certain amount of time. This may also be called a fetal kick count. A fetal movement count is recommended for every pregnant woman. You may be asked to start counting fetal movements as early as week 28 of your pregnancy. °Pay attention to   when your baby is most active. You may notice your baby's sleep and wake cycles. You may also notice things that make your baby move more. You should do a fetal movement count: °· When your baby is normally most active. °· At the same time each day. ° °A good time to count movements is while you are resting, after having something to eat and drink. °How do I count fetal movements? °1. Find a quiet, comfortable area. Sit, or lie down on your side. °2. Write down the date, the start time and stop time, and the number  of movements that you felt between those two times. Take this information with you to your health care visits. °3. For 2 hours, count kicks, flutters, swishes, rolls, and jabs. You should feel at least 10 movements during 2 hours. °4. You may stop counting after you have felt 10 movements. °5. If you do not feel 10 movements in 2 hours, have something to eat and drink. Then, keep resting and counting for 1 hour. If you feel at least 4 movements during that hour, you may stop counting. °Contact a health care provider if: °· You feel fewer than 4 movements in 2 hours. °· Your baby is not moving like he or she usually does. °Date: ____________ Start time: ____________ Stop time: ____________ Movements: ____________ °Date: ____________ Start time: ____________ Stop time: ____________ Movements: ____________ °Date: ____________ Start time: ____________ Stop time: ____________ Movements: ____________ °Date: ____________ Start time: ____________ Stop time: ____________ Movements: ____________ °Date: ____________ Start time: ____________ Stop time: ____________ Movements: ____________ °Date: ____________ Start time: ____________ Stop time: ____________ Movements: ____________ °Date: ____________ Start time: ____________ Stop time: ____________ Movements: ____________ °Date: ____________ Start time: ____________ Stop time: ____________ Movements: ____________ °Date: ____________ Start time: ____________ Stop time: ____________ Movements: ____________ °This information is not intended to replace advice given to you by your health care provider. Make sure you discuss any questions you have with your health care provider. °Document Released: 02/21/2006 Document Revised: 09/21/2015 Document Reviewed: 03/03/2015 °Elsevier Interactive Patient Education © 2018 [employer] ° °

## 2017-12-19 NOTE — MAU Note (Signed)
Pt reports ctx since early this morning. About 3-4 min apart. Reports some bloody show and good fetal movement. 2cm dilated last check Tues.

## 2017-12-20 ENCOUNTER — Other Ambulatory Visit: Payer: Self-pay

## 2017-12-20 ENCOUNTER — Ambulatory Visit (INDEPENDENT_AMBULATORY_CARE_PROVIDER_SITE_OTHER): Payer: Medicaid Other

## 2017-12-20 ENCOUNTER — Encounter (HOSPITAL_COMMUNITY): Payer: Self-pay | Admitting: *Deleted

## 2017-12-20 ENCOUNTER — Inpatient Hospital Stay (HOSPITAL_COMMUNITY)
Admission: AD | Admit: 2017-12-20 | Discharge: 2017-12-23 | DRG: 806 | Disposition: A | Discharge status: Home / Self Care | Payer: Medicaid Other | Attending: Obstetrics & Gynecology | Admitting: Obstetrics & Gynecology

## 2017-12-20 VITALS — BP 134/75 | HR 67 | Wt 183.0 lb

## 2017-12-20 DIAGNOSIS — O0990 Supervision of high risk pregnancy, unspecified, unspecified trimester: Secondary | ICD-10-CM

## 2017-12-20 DIAGNOSIS — O09219 Supervision of pregnancy with history of pre-term labor, unspecified trimester: Secondary | ICD-10-CM

## 2017-12-20 DIAGNOSIS — A6000 Herpesviral infection of urogenital system, unspecified: Secondary | ICD-10-CM | POA: Diagnosis present

## 2017-12-20 DIAGNOSIS — O99824 Streptococcus B carrier state complicating childbirth: Secondary | ICD-10-CM | POA: Diagnosis present

## 2017-12-20 DIAGNOSIS — O479 False labor, unspecified: Secondary | ICD-10-CM | POA: Diagnosis not present

## 2017-12-20 DIAGNOSIS — Z7982 Long term (current) use of aspirin: Secondary | ICD-10-CM | POA: Diagnosis not present

## 2017-12-20 DIAGNOSIS — O134 Gestational [pregnancy-induced] hypertension without significant proteinuria, complicating childbirth: Principal | ICD-10-CM | POA: Diagnosis present

## 2017-12-20 DIAGNOSIS — O9832 Other infections with a predominantly sexual mode of transmission complicating childbirth: Secondary | ICD-10-CM | POA: Diagnosis present

## 2017-12-20 DIAGNOSIS — O0993 Supervision of high risk pregnancy, unspecified, third trimester: Secondary | ICD-10-CM

## 2017-12-20 DIAGNOSIS — Z3A37 37 weeks gestation of pregnancy: Secondary | ICD-10-CM | POA: Diagnosis not present

## 2017-12-20 DIAGNOSIS — O133 Gestational [pregnancy-induced] hypertension without significant proteinuria, third trimester: Secondary | ICD-10-CM

## 2017-12-20 DIAGNOSIS — O0930 Supervision of pregnancy with insufficient antenatal care, unspecified trimester: Secondary | ICD-10-CM

## 2017-12-20 DIAGNOSIS — O139 Gestational [pregnancy-induced] hypertension without significant proteinuria, unspecified trimester: Secondary | ICD-10-CM | POA: Diagnosis present

## 2017-12-20 DIAGNOSIS — O09899 Supervision of other high risk pregnancies, unspecified trimester: Secondary | ICD-10-CM

## 2017-12-20 DIAGNOSIS — Z8759 Personal history of other complications of pregnancy, childbirth and the puerperium: Secondary | ICD-10-CM

## 2017-12-20 HISTORY — DX: Essential (primary) hypertension: I10

## 2017-12-20 LAB — CBC
HEMATOCRIT: 34.2 % — AB (ref 36.0–46.0)
Hemoglobin: 10.8 g/dL — ABNORMAL LOW (ref 12.0–15.0)
MCH: 25.6 pg — AB (ref 26.0–34.0)
MCHC: 31.6 g/dL (ref 30.0–36.0)
MCV: 81 fL (ref 80.0–100.0)
NRBC: 0 % (ref 0.0–0.2)
PLATELETS: 227 10*3/uL (ref 150–400)
RBC: 4.22 MIL/uL (ref 3.87–5.11)
RDW: 13.9 % (ref 11.5–15.5)
WBC: 5.6 10*3/uL (ref 4.0–10.5)

## 2017-12-20 LAB — TYPE AND SCREEN
ABO/RH(D): A POS
Antibody Screen: NEGATIVE

## 2017-12-20 MED ORDER — OXYTOCIN 40 UNITS IN LACTATED RINGERS INFUSION - SIMPLE MED
2.5000 [IU]/h | INTRAVENOUS | Status: DC
  Administered 2017-12-21: 2.5 [IU]/h via INTRAVENOUS
  Filled 2017-12-20: qty 1000

## 2017-12-20 MED ORDER — FLEET ENEMA 7-19 GM/118ML RE ENEM: 1.0000 | ENEMA | RECTAL | Status: DC | PRN

## 2017-12-20 MED ORDER — OXYTOCIN 40 UNITS IN LACTATED RINGERS INFUSION - SIMPLE MED
1.0000 m[IU]/min | INTRAVENOUS | Status: DC
  Administered 2017-12-20: 6 m[IU]/min via INTRAVENOUS
  Administered 2017-12-20: 2 m[IU]/min via INTRAVENOUS
  Administered 2017-12-20: 4 m[IU]/min via INTRAVENOUS
  Administered 2017-12-21: 8 m[IU]/min via INTRAVENOUS

## 2017-12-20 MED ORDER — OXYTOCIN BOLUS FROM INFUSION
500.0000 mL | Freq: Once | INTRAVENOUS | Status: AC
  Administered 2017-12-21: 500 mL via INTRAVENOUS

## 2017-12-20 MED ORDER — OXYCODONE-ACETAMINOPHEN 5-325 MG PO TABS: 1.0000 | ORAL_TABLET | ORAL | Status: DC | PRN

## 2017-12-20 MED ORDER — OXYCODONE-ACETAMINOPHEN 5-325 MG PO TABS: 2.0000 | ORAL_TABLET | ORAL | Status: DC | PRN

## 2017-12-20 MED ORDER — ACETAMINOPHEN 325 MG PO TABS
650.0000 mg | ORAL_TABLET | ORAL | Status: DC | PRN
  Administered 2017-12-20 – 2017-12-21 (×2): 650 mg via ORAL
  Filled 2017-12-20 (×2): qty 2

## 2017-12-20 MED ORDER — LACTATED RINGERS IV SOLN
500.0000 mL | INTRAVENOUS | Status: DC | PRN
  Administered 2017-12-20: 1000 mL via INTRAVENOUS
  Administered 2017-12-21: 500 mL via INTRAVENOUS

## 2017-12-20 MED ORDER — PENICILLIN G 3 MILLION UNITS IVPB - SIMPLE MED
3.0000 10*6.[IU] | INTRAVENOUS | Status: DC
  Administered 2017-12-21 (×3): 3 10*6.[IU] via INTRAVENOUS
  Filled 2017-12-20 (×5): qty 100

## 2017-12-20 MED ORDER — SOD CITRATE-CITRIC ACID 500-334 MG/5ML PO SOLN: 30.0000 mL | ORAL | Status: DC | PRN

## 2017-12-20 MED ORDER — LACTATED RINGERS IV SOLN
INTRAVENOUS | Status: DC
  Administered 2017-12-20 – 2017-12-21 (×2): via INTRAVENOUS

## 2017-12-20 MED ORDER — SODIUM CHLORIDE 0.9 % IV SOLN
5.0000 10*6.[IU] | Freq: Once | INTRAVENOUS | Status: AC
  Administered 2017-12-20: 5 10*6.[IU] via INTRAVENOUS
  Filled 2017-12-20: qty 5

## 2017-12-20 MED ORDER — ONDANSETRON HCL 4 MG/2ML IJ SOLN
4.0000 mg | Freq: Four times a day (QID) | INTRAMUSCULAR | Status: DC | PRN
  Administered 2017-12-21: 4 mg via INTRAVENOUS
  Filled 2017-12-20: qty 2

## 2017-12-20 MED ORDER — LIDOCAINE HCL (PF) 1 % IJ SOLN
30.0000 mL | INTRAMUSCULAR | Status: DC | PRN
  Filled 2017-12-20: qty 30

## 2017-12-20 MED ORDER — TERBUTALINE SULFATE 1 MG/ML IJ SOLN
0.2500 mg | Freq: Once | INTRAMUSCULAR | Status: DC | PRN
  Filled 2017-12-20: qty 1

## 2017-12-20 NOTE — Anesthesia Pain Management Evaluation Note (Signed)
  [facility] Pain Management Visit Note  Patient: Joy Nailiffany A Rivera, 27 y.o., female  "Hello I am a member of the anesthesia team at [facility]. We have an anesthesia team available at all times to provide care throughout the hospital, including epidural management and anesthesia for C-section. I don't know your plan for the delivery whether it a natural birth, water birth, IV sedation, nitrous supplementation, doula or epidural, but we want to meet your pain goals."   1.Was your pain managed to your expectations on prior hospitalizations?   Yes   2.What is your expectation for pain management during this hospitalization?    epidural   3.How can we help you reach that goal? epidural  Record the patient's initial score and the patient's pain goal.   Pain: 7  Pain Goal: 7 [facility] wants you to be able to say your pain was always managed very well.  Treyce Spillers 12/20/2017

## 2017-12-20 NOTE — MAU Provider Note (Signed)
Chief Complaint: Contractions   First Provider Initiated Contact with Patient 12/20/17 1918      SUBJECTIVE HPI: Joy Rivera is a 27 y.o. [id number] who presents to maternity admissions for contractions every 5 minutes. She denies leaking or bleeding. Reports normal fetal movement. She denies any headache, visual changes or epigastric pain.     Past Medical History:  Diagnosis Date  . Abnormal Pap smear   . Anxiety   . Depression    Past Surgical History:  Procedure Laterality Date  . THERAPEUTIC ABORTION     Social History   Socioeconomic History  . Marital status: Single    Spouse name: Not on file  . Number of children: Not on file  . Years of education: Not on file  . Highest education level: Not on file  Occupational History  . Not on file  Social Needs  . Financial resource strain: Not on file  . Food insecurity:    Worry: Not on file    Inability: Not on file  . Transportation needs:    Medical: Not on file    Non-medical: Not on file  Tobacco Use  . Smoking status: Never Smoker  . Smokeless tobacco: Never Used  Substance and Sexual Activity  . Alcohol use: No  . Drug use: No  . Sexual activity: Yes    Birth control/protection: None  Lifestyle  . Physical activity:    Days per week: Not on file    Minutes per session: Not on file  . Stress: Not on file  Relationships  . Social connections:    Talks on phone: Not on file    Gets together: Not on file    Attends religious service: Not on file    Active member of club or organization: Not on file    Attends meetings of clubs or organizations: Not on file    Relationship status: Not on file  . Intimate partner violence:    Fear of current or ex partner: Not on file    Emotionally abused: Not on file    Physically abused: Not on file    Forced sexual activity: Not on file  Other Topics Concern  . Not on file  Social History Narrative  . Not on file   No current facility-administered  medications on file prior to encounter.    Current Outpatient Medications on File Prior to Encounter  Medication Sig Dispense Refill  . acetaminophen (TYLENOL) 500 MG tablet Take 1,000 mg by mouth every 6 (six) hours as needed for moderate pain or headache.    Marland Kitchen. aspirin EC 81 MG tablet Take 1 tablet (81 mg total) by mouth daily. (Patient not taking: Reported on 12/19/2017) 30 tablet 11  . famotidine (PEPCID) 20 MG tablet Take 1 tablet (20 mg total) by mouth 2 (two) times daily. (Patient not taking: Reported on 12/19/2017) 60 tablet 2  . Prenatal Vit-Fe Fumarate-FA (PRENATAL COMPLETE) 14-0.4 MG TABS Take 1 tablet by mouth daily. (Patient not taking: Reported on 12/19/2017) 60 each 0  . valACYclovir (VALTREX) 500 MG tablet Take 1 tablet (500 mg total) by mouth 2 (two) times daily. (Patient not taking: Reported on 12/19/2017) 30 tablet 6   No Known Allergies  ROS:  Review of Systems  Constitutional: Negative.  Negative for fatigue and fever.  HENT: Negative.   Respiratory: Negative.  Negative for shortness of breath.   Cardiovascular: Negative.  Negative for chest pain.  Gastrointestinal: Positive for abdominal pain. Negative for constipation,  diarrhea, nausea and vomiting.  Genitourinary: Negative.  Negative for dysuria, vaginal bleeding and vaginal discharge.  Neurological: Negative.  Negative for dizziness and headaches.    I have reviewed patient's Past Medical Hx, Surgical Hx, Family Hx, Social Hx, medications and allergies.   Physical Exam   Patient Vitals for the past 24 hrs:  BP Temp Pulse  12/20/17 1911 (!) 144/82 98 F (36.7 C) (!) 56   Physical Exam  Nursing note and vitals reviewed. Constitutional: She is oriented to person, place, and time. She appears well-developed and well-nourished. No distress.  HENT:  Head: Normocephalic.  Eyes: Pupils are equal, round, and reactive to light.  Cardiovascular: Normal rate, regular rhythm and normal heart sounds.  Respiratory:  Effort normal and breath sounds normal. No respiratory distress.  GI: Soft. Bowel sounds are normal. She exhibits no distension. There is no tenderness.  Neurological: She is alert and oriented to person, place, and time. She has normal reflexes. She displays normal reflexes. She exhibits normal muscle tone. Coordination normal.  Skin: Skin is warm and dry.  Psychiatric: She has a normal mood and affect. Her behavior is normal. Judgment and thought content normal.   Fetal Tracing:  Baseline: 115 Variability: moderate Accels: 15x15 Decels: none  Toco: 5-7   ASSESSMENT 1. Gestational hypertension, third trimester     PLAN -Admit to labor and delivery -Report called to labor team  Rolm Bookbinder, CNM 12/20/2017 7:18 PM

## 2017-12-20 NOTE — MAU Note (Signed)
Pt presents to MAU c/o ctx every 2-3 min with +FM. Bloody show. No LOF.

## 2017-12-20 NOTE — Progress Notes (Signed)
Pt here today for BP Check, 8119w3d with previous elevated pressures. 134/75 today after sitting for 5 mins. Pt has no headache, blurred vision or dizziness. Per Judeth HornErin Lawrence, NP pt is to keep her appointment on monday and follow up as needed.

## 2017-12-21 ENCOUNTER — Inpatient Hospital Stay (HOSPITAL_COMMUNITY): Payer: Medicaid Other | Admitting: Anesthesiology

## 2017-12-21 ENCOUNTER — Encounter (HOSPITAL_COMMUNITY): Payer: Self-pay | Admitting: *Deleted

## 2017-12-21 DIAGNOSIS — Z3A37 37 weeks gestation of pregnancy: Secondary | ICD-10-CM

## 2017-12-21 DIAGNOSIS — O0930 Supervision of pregnancy with insufficient antenatal care, unspecified trimester: Secondary | ICD-10-CM

## 2017-12-21 DIAGNOSIS — O134 Gestational [pregnancy-induced] hypertension without significant proteinuria, complicating childbirth: Secondary | ICD-10-CM

## 2017-12-21 LAB — CBC
HEMATOCRIT: 33.7 % — AB (ref 36.0–46.0)
Hemoglobin: 10.7 g/dL — ABNORMAL LOW (ref 12.0–15.0)
MCH: 25.5 pg — AB (ref 26.0–34.0)
MCHC: 31.8 g/dL (ref 30.0–36.0)
MCV: 80.4 fL (ref 80.0–100.0)
Platelets: 221 10*3/uL (ref 150–400)
RBC: 4.19 MIL/uL (ref 3.87–5.11)
RDW: 13.9 % (ref 11.5–15.5)
WBC: 6.1 10*3/uL (ref 4.0–10.5)
nRBC: 0 % (ref 0.0–0.2)

## 2017-12-21 LAB — RPR: RPR Ser Ql: NONREACTIVE

## 2017-12-21 MED ORDER — DIBUCAINE 1 % RE OINT: 1.0000 "application " | TOPICAL_OINTMENT | RECTAL | Status: DC | PRN

## 2017-12-21 MED ORDER — IBUPROFEN 600 MG PO TABS
600.0000 mg | ORAL_TABLET | Freq: Four times a day (QID) | ORAL | Status: DC
  Administered 2017-12-21 – 2017-12-23 (×7): 600 mg via ORAL
  Filled 2017-12-21 (×8): qty 1

## 2017-12-21 MED ORDER — OXYCODONE HCL 5 MG PO TABS
5.0000 mg | ORAL_TABLET | ORAL | Status: DC | PRN
  Administered 2017-12-21 – 2017-12-22 (×4): 5 mg via ORAL
  Filled 2017-12-21 (×4): qty 1

## 2017-12-21 MED ORDER — LACTATED RINGERS IV SOLN
500.0000 mL | Freq: Once | INTRAVENOUS | Status: AC
  Administered 2017-12-21: 500 mL via INTRAVENOUS

## 2017-12-21 MED ORDER — TETANUS-DIPHTH-ACELL PERTUSSIS 5-2.5-18.5 LF-MCG/0.5 IM SUSP: 0.5000 mL | Freq: Once | INTRAMUSCULAR | Status: DC

## 2017-12-21 MED ORDER — DIPHENHYDRAMINE HCL 50 MG/ML IJ SOLN
12.5000 mg | INTRAMUSCULAR | Status: DC | PRN
  Administered 2017-12-21: 12.5 mg via INTRAVENOUS
  Filled 2017-12-21: qty 1

## 2017-12-21 MED ORDER — PRENATAL MULTIVITAMIN CH: 1.0000 | ORAL_TABLET | Freq: Every day | ORAL | Status: DC

## 2017-12-21 MED ORDER — FAMOTIDINE IN NACL 20-0.9 MG/50ML-% IV SOLN
20.0000 mg | Freq: Once | INTRAVENOUS | Status: DC
  Filled 2017-12-21: qty 50

## 2017-12-21 MED ORDER — DIPHENHYDRAMINE HCL 25 MG PO CAPS
25.0000 mg | ORAL_CAPSULE | Freq: Four times a day (QID) | ORAL | Status: DC | PRN
  Administered 2017-12-23: 25 mg via ORAL
  Filled 2017-12-21: qty 1

## 2017-12-21 MED ORDER — FENTANYL 2.5 MCG/ML BUPIVACAINE 1/10 % EPIDURAL INFUSION (WH - ANES)
14.0000 mL/h | INTRAMUSCULAR | Status: DC | PRN
  Administered 2017-12-21: 14 mL/h via EPIDURAL
  Filled 2017-12-21: qty 100

## 2017-12-21 MED ORDER — EPHEDRINE 5 MG/ML INJ
10.0000 mg | INTRAVENOUS | Status: DC | PRN
  Filled 2017-12-21: qty 2

## 2017-12-21 MED ORDER — MEASLES, MUMPS & RUBELLA VAC IJ SOLR: 0.5000 mL | Freq: Once | INTRAMUSCULAR | Status: DC

## 2017-12-21 MED ORDER — PHENYLEPHRINE 40 MCG/ML (10ML) SYRINGE FOR IV PUSH (FOR BLOOD PRESSURE SUPPORT)
80.0000 ug | PREFILLED_SYRINGE | INTRAVENOUS | Status: DC | PRN
  Filled 2017-12-21: qty 5
  Filled 2017-12-21: qty 10

## 2017-12-21 MED ORDER — ONDANSETRON HCL 4 MG PO TABS: 4.0000 mg | ORAL_TABLET | ORAL | Status: DC | PRN

## 2017-12-21 MED ORDER — ONDANSETRON HCL 4 MG/2ML IJ SOLN: 4.0000 mg | INTRAMUSCULAR | Status: DC | PRN

## 2017-12-21 MED ORDER — SIMETHICONE 80 MG PO CHEW: 80.0000 mg | CHEWABLE_TABLET | ORAL | Status: DC | PRN

## 2017-12-21 MED ORDER — BENZOCAINE-MENTHOL 20-0.5 % EX AERO: 1.0000 "application " | INHALATION_SPRAY | CUTANEOUS | Status: DC | PRN

## 2017-12-21 MED ORDER — SENNOSIDES-DOCUSATE SODIUM 8.6-50 MG PO TABS
2.0000 | ORAL_TABLET | ORAL | Status: DC
  Administered 2017-12-22 – 2017-12-23 (×2): 2 via ORAL
  Filled 2017-12-21 (×3): qty 2

## 2017-12-21 MED ORDER — ACETAMINOPHEN 325 MG PO TABS
650.0000 mg | ORAL_TABLET | ORAL | Status: DC | PRN
  Administered 2017-12-21 – 2017-12-22 (×4): 650 mg via ORAL
  Filled 2017-12-21 (×4): qty 2

## 2017-12-21 MED ORDER — FENTANYL CITRATE (PF) 100 MCG/2ML IJ SOLN
50.0000 ug | Freq: Once | INTRAMUSCULAR | Status: DC
  Filled 2017-12-21: qty 2

## 2017-12-21 MED ORDER — COCONUT OIL OIL: 1.0000 "application " | TOPICAL_OIL | Status: DC | PRN

## 2017-12-21 MED ORDER — WITCH HAZEL-GLYCERIN EX PADS: 1.0000 "application " | MEDICATED_PAD | CUTANEOUS | Status: DC | PRN

## 2017-12-21 MED ORDER — LIDOCAINE-EPINEPHRINE (PF) 2 %-1:200000 IJ SOLN
INTRAMUSCULAR | Status: DC | PRN
  Administered 2017-12-21: 4 mL via EPIDURAL
  Administered 2017-12-21: 3 mL via EPIDURAL

## 2017-12-21 MED ORDER — PHENYLEPHRINE 40 MCG/ML (10ML) SYRINGE FOR IV PUSH (FOR BLOOD PRESSURE SUPPORT)
80.0000 ug | PREFILLED_SYRINGE | INTRAVENOUS | Status: DC | PRN
  Filled 2017-12-21: qty 5

## 2017-12-21 NOTE — Anesthesia Procedure Notes (Signed)
Epidural Patient location during procedure: OB Start time: 12/21/2017 5:35 AM End time: 12/21/2017 5:45 AM  Staffing Anesthesiologist: Elmer PickerWoodrum, Levita Monical L, MD Performed: anesthesiologist   Preanesthetic Checklist Completed: patient identified, pre-op evaluation, timeout performed, IV checked, risks and benefits discussed and monitors and equipment checked  Epidural Patient position: sitting Prep: site prepped and draped and DuraPrep Patient monitoring: continuous pulse ox, blood pressure, heart rate and cardiac monitor Approach: midline Location: L3-L4 Injection technique: LOR air  Needle:  Needle type: Tuohy  Needle gauge: 17 G Needle length: 9 cm Needle insertion depth: 6 cm Catheter type: closed end flexible Catheter size: 19 Gauge Catheter at skin depth: 11 cm Test dose: negative  Assessment Sensory level: T8 Events: blood not aspirated, injection not painful, no injection resistance, negative IV test and no paresthesia  Additional Notes Patient identified. Risks/Benefits/Options discussed with patient including but not limited to bleeding, infection, nerve damage, paralysis, failed block, incomplete pain control, headache, blood pressure changes, nausea, vomiting, reactions to medication both or allergic, itching and postpartum back pain. Confirmed with bedside nurse the patient's most recent platelet count. Confirmed with patient that they are not currently taking any anticoagulation, have any bleeding history or any family history of bleeding disorders. Patient expressed understanding and wished to proceed. All questions were answered. Sterile technique was used throughout the entire procedure. Please see nursing notes for vital signs. Test dose was given through epidural catheter and negative prior to continuing to dose epidural or start infusion. Warning signs of high block given to the patient including shortness of breath, tingling/numbness in hands, complete motor block,  or any concerning symptoms with instructions to call for help. Patient was given instructions on fall risk and not to get out of bed. All questions and concerns addressed with instructions to call with any issues or inadequate analgesia.  Reason for block:procedure for pain

## 2017-12-21 NOTE — H&P (Addendum)
Joy Nailiffany A Rivera is a 27 y.o. female [phone] @ 37.3wks by 13wk scan presenting for IOL due to gHTN. She was in MAU for a labor eval when she was noted to have a BP of 144/82 as well as an elevated BP in MAU on 11/14. Denies RUQ pain; slight H/A, but no visual disturbances. Reports ctx q 2-3 mins, +bldy show but not leaking fluid. 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 Abnormal Pap smear   . Anxiety   . Depression    Past Surgical History:  Procedure Laterality Date  . THERAPEUTIC ABORTION     Family History: family history includes Cancer in her maternal aunt, maternal grandfather, maternal grandmother, and maternal uncle; Heart disease in her father; Hypertension in her father and paternal grandmother. Social History:  reports that she has never smoked. She has never used smokeless tobacco. She reports that she does not drink alcohol or use drugs.     Maternal Diabetes: No Genetic Screening: Declined- too late Maternal Ultrasounds/Referrals: Normal Fetal Ultrasounds or other Referrals:  None Maternal Substance Abuse:  No Significant Maternal Medications:  None Significant Maternal Lab Results:  Lab values include: Group B Strep positive Other Comments:  None  ROS History Dilation: 2.5 Effacement (%): 50 Station: -2 Exam by:: Philipp DeputyKim Ulice Follett, CNM Blood pressure 112/62, pulse 72, temperature 99.1 F (37.3 C), temperature source Oral, resp. rate 18, height 5\' 3"  (1.6 m), weight 82.6 kg, last  menstrual period 04/01/2017, unknown if currently breastfeeding. Exam Physical Exam  Constitutional: She is oriented to person, place, and time. She appears well-developed.  HENT:  Head: Normocephalic.  Neck: Normal range of motion.  Cardiovascular: Normal rate.  Respiratory: Effort normal.  GI:  EFM 120s, +accels, no decels, Cat 1 Rare ctx  Musculoskeletal: Normal range of motion.  Neurological: She is alert and oriented to person, place, and time.  Skin: Skin is warm and dry.  Psychiatric: She has a normal mood and affect. Her behavior is normal. Thought content normal.    CBC    Component Value Date/Time   WBC 5.6 12/20/2017 1938   RBC 4.22 12/20/2017 1938   HGB 10.8 (L) 12/20/2017 1938   HGB 10.7 (L) 10/14/2017 1035   HCT 34.2 (L) 12/20/2017 1938   HCT 32.4 (L) 10/14/2017 1035   PLT 227 12/20/2017 1938   PLT 255 10/14/2017 1035   MCV 81.0 12/20/2017 1938   MCV 81 10/14/2017 1035   MCH 25.6 (L) 12/20/2017 1938   MCHC 31.6 12/20/2017 1938   RDW 13.9 12/20/2017 1938   RDW 13.3 10/14/2017 1035   LYMPHSABS 1.6 10/14/2017 1035   MONOABS 0.2 07/05/2017 2029   EOSABS 0.1 10/14/2017 1035  BASOSABS 0.0 10/14/2017 1035   CMP     Component Value Date/Time   NA 136 12/19/2017 1129   NA 136 10/14/2017 1035   K 3.9 12/19/2017 1129   CL 104 12/19/2017 1129   CO2 25 12/19/2017 1129   GLUCOSE 75 12/19/2017 1129   BUN 10 12/19/2017 1129   BUN 10 10/14/2017 1035   CREATININE 0.76 12/19/2017 1129   CALCIUM 8.3 (L) 12/19/2017 1129   PROT 6.6 12/19/2017 1129   PROT 6.2 10/14/2017 1035   ALBUMIN 2.7 (L) 12/19/2017 1129   ALBUMIN 3.3 (L) 10/14/2017 1035   AST 14 (L) 12/19/2017 1129   ALT 10 12/19/2017 1129   ALKPHOS 191 (H) 12/19/2017 1129   BILITOT 0.5 12/19/2017 1129   BILITOT <0.2 10/14/2017 1035   GFRNONAA >60 12/19/2017 1129   GFRAA >60 12/19/2017 1129   UPC: 0.09  Prenatal labs: ABO, Rh: --/--/A POS (11/15 1938) Antibody: NEG (11/15 1938) Rubella: 5.72 (09/09  1035) RPR: Non Reactive (09/09 1035)  HBsAg: Negative (09/09 1035)  HIV: Non Reactive (09/09 1035)  GBS: Positive (12/22 0000)   Assessment/Plan: IUP@37 .3wks New dx of gHTN (neg labs) Cx favorable  Admit to [facility] for IOL method PCN for GBS ppx Will try Tylenol for mild H/A Watch BP/pre-e s/s for progression to dx of pre-e  Anticipate SVD   Arabella Merles CNM 12/21/2017, 12:32 AM

## 2017-12-21 NOTE — Progress Notes (Signed)
LABOR PROGRESS NOTE  Joy Rivera is a 27 y.o. [phone] at 3660w4d  admitted for IOL for gHTN  Subjective: Feeling more pressure, but overall comfortable with the epidural   Objective: BP 111/67   Pulse (!) 54   Temp 98.4 F (36.9 C) (Oral)   Resp 20   Ht 5\' 3"  (1.6 m)   Wt 82.6 kg   LMP 04/01/2017   SpO2 98%   BMI 32.24 kg/m  or  Vitals:   12/21/17 0801 12/21/17 0831 12/21/17 0852 12/21/17 0901  BP: 117/80 122/81 123/79 111/67  Pulse: 72 70 (!) 50 (!) 54  Resp: 18 18  20   Temp:      TempSrc:      SpO2:      Weight:      Height:         Dilation: (P) 10 Effacement (%): 70 Cervical Position: Middle Station: 0 Presentation: Vertex Exam by:: Joy Rivera  FHT: baseline rate 110, moderate varibility, + acel, early decel Toco: regular contractions every 3-3753m  Labs: Lab Results  Component Value Date   WBC 6.1 12/21/2017   HGB 10.7 (L) 12/21/2017   HCT 33.7 (L) 12/21/2017   MCV 80.4 12/21/2017   PLT 221 12/21/2017    Patient Active Problem List   Diagnosis Date Noted  . Limited prenatal care 12/21/2017  . Gestational hypertension 12/20/2017  . Supervision of high risk pregnancy, antepartum 10/14/2017  . History of pre-eclampsia with severe features  10/14/2017  . 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 History of preterm delivery, currently pregnant 10/23/2016  . History of placenta abruption 10/23/2016  . Genital herpes 01/02/2016  . Genital warts complicating pregnancy 01/02/2016    Assessment / Plan: 27 y.o. [id number] at 7360w4d here for IOL for gHTN  Labor: active. AROM now. Continue pitcin. Labor down until pt feels urge to push as clinically indicated.  Fetal Wellbeing:  Cat 2 strip Pain Control:  Epidural Anticipated MOD:  SVD  Joy AbbotNimeka Arilla Hice, MD OB Fellow  12/21/2017, 9:30 AM

## 2017-12-21 NOTE — Anesthesia Preprocedure Evaluation (Signed)
Anesthesia Evaluation  Patient identified by MRN, date of birth, ID band Patient awake    Reviewed: Allergy & Precautions, NPO status , Patient's Chart, lab work & pertinent test results  Airway Mallampati: II  TM Distance: >3 FB Neck ROM: Full    Dental no notable dental hx. (+) Teeth Intact   Pulmonary neg pulmonary ROS,    Pulmonary exam normal breath sounds clear to auscultation       Cardiovascular negative cardio ROS Normal cardiovascular exam Rhythm:Regular Rate:Normal     Neuro/Psych PSYCHIATRIC DISORDERS Anxiety Depression negative neurological ROS     GI/Hepatic negative GI ROS, Neg liver ROS,   Endo/Other  negative endocrine ROS  Renal/GU negative Renal ROS  negative genitourinary   Musculoskeletal negative musculoskeletal ROS (+)   Abdominal   Peds negative pediatric ROS (+)  Hematology negative hematology ROS (+)   Anesthesia Other Findings gHTN  Reproductive/Obstetrics (+) Pregnancy                             Anesthesia Physical Anesthesia Plan  ASA: III  Anesthesia Plan: Epidural   Post-op Pain Management:    Induction:   PONV Risk Score and Plan: Treatment may vary due to age or medical condition  Airway Management Planned: Natural Airway  Additional Equipment:   Intra-op Plan:   Post-operative Plan:   Informed Consent: I have reviewed the patients History and Physical, chart, labs and discussed the procedure including the risks, benefits and alternatives for the proposed anesthesia with the patient or authorized representative who has indicated his/her understanding and acceptance.     Plan Discussed with: Anesthesiologist  Anesthesia Plan Comments: (Patient identified. Risks, benefits, options discussed with patient including but not limited to bleeding, infection, nerve damage, paralysis, failed block, incomplete pain control, headache, blood pressure  changes, nausea, vomiting, reactions to medication, itching, and post partum back pain. Confirmed with bedside nurse the patient's most recent platelet count. Confirmed with the patient that they are not taking any anticoagulation, have any bleeding history or any family history of bleeding disorders. Patient expressed understanding and wishes to proceed. All questions were answered. )        Anesthesia Quick Evaluation

## 2017-12-21 NOTE — Progress Notes (Signed)
Patient ID: Joy Rivera, female   DOB: Jan 16, 1991, 27 y.o.   MRN: [id number]  Comfortable w/ epidural; no longer has H/A- was relieved by Tylenol  BP 114/78, other VSS FHR 110s, +accels, no decels Ctx q 2 mins with Pit @ 9114mu/min Cx deferred (was 4/70 @ 0500)  IUP@term  gHTN- stable IOL process- latent phse  Dayshift to check cx within next hr Anticipate SVD  Arabella MerlesKimberly D Shaw [facility] CorpCNM 12/21/2017

## 2017-12-21 NOTE — Anesthesia Postprocedure Evaluation (Signed)
Anesthesia Post Note  Patient: Joy Rivera  Procedure(s) Performed: AN AD HOC LABOR EPIDURAL     Patient location during evaluation: Mother Baby Anesthesia Type: Epidural Level of consciousness: awake and alert, oriented and patient cooperative Pain management: pain level controlled Vital Signs Assessment: post-procedure vital signs reviewed and stable Respiratory status: spontaneous breathing Cardiovascular status: stable Postop Assessment: no headache, epidural receding, patient able to bend at knees and no signs of nausea or vomiting Anesthetic complications: no Comments: Pain score 8.  Pt c/o headache.  Bedside RN at bedside.  Pt to report back headache if does not improve with rest an additional pains     Last Vitals:  Vitals:   12/21/17 1330 12/21/17 1400  BP: (!) 129/91 115/67  Pulse: 61 (!) 55  Resp: 18 20  Temp:    SpO2:      Last Pain:  Vitals:   12/21/17 1400  TempSrc: Oral  PainSc: 7    Pain Goal: Patients Stated Pain Goal: 9 (12/21/17 0731)               Merrilyn PumaWRINKLE,Josue Falconi

## 2017-12-22 MED ORDER — CYCLOBENZAPRINE HCL 10 MG PO TABS
10.0000 mg | ORAL_TABLET | Freq: Once | ORAL | Status: AC
  Administered 2017-12-22: 10 mg via ORAL
  Filled 2017-12-22: qty 1

## 2017-12-22 MED ORDER — KETOROLAC TROMETHAMINE 60 MG/2ML IM SOLN
60.0000 mg | Freq: Once | INTRAMUSCULAR | Status: AC
  Administered 2017-12-22: 60 mg via INTRAMUSCULAR
  Filled 2017-12-22: qty 2

## 2017-12-22 MED ORDER — OXYCODONE-ACETAMINOPHEN 5-325 MG PO TABS
1.0000 | ORAL_TABLET | ORAL | Status: DC | PRN
  Administered 2017-12-22 – 2017-12-23 (×5): 2 via ORAL
  Filled 2017-12-22 (×5): qty 2

## 2017-12-22 NOTE — Progress Notes (Signed)
POSTPARTUM PROGRESS NOTE  Post Partum Day 1  Subjective:  Joy Rivera is a 27 y.o. [id number] s/p SVD at 6393w4d.  No acute events overnight.  Pt denies problems with ambulating, voiding or po intake.  She denies nausea or vomiting.  Pain is poorly controlled. She reports continued abdominal cramping with movement and back pain. She has had flatus. She has not had bowel movement.  Lochia Small.   Objective: Blood pressure 136/74, pulse (!) 56, temperature 98.3 F (36.8 C), temperature source Axillary, resp. rate 18, height 5\' 3"  (1.6 m), weight 82.6 kg, last menstrual period 04/01/2017, SpO2 100 %, unknown if currently breastfeeding.  Physical Exam:  General: alert, cooperative and no distress Chest: no respiratory distress Heart:regular rate, distal pulses intact Abdomen: soft, nontender,  Uterine Fundus: firm, appropriately tender DVT Evaluation: No calf swelling or tenderness Extremities: no edema  Recent Labs    12/20/17 1938 12/21/17 0435  HGB 10.8* 10.7*  HCT 34.2* 33.7*    Assessment/Plan: Joy Rivera is a 27 y.o. [id number] s/p SVD at 5393w4d   PPD#1 - Doing well Contraception: Nexplanon Feeding: Formula Dispo: Plan for discharge tomorrow. #Pain management: sat at bedside in length to discuss pain management and control. Agrees to abdominal binder, Toradol and flexeril as one time dose.  # Edinburgh score of 18: patient reports needing to vent, sat in room to allow patient to vent and continue to grieve about child that passed on last year. Discussed PP [facility] visit in office.    LOS: 2 days   Sharyon CableRogers, Raliegh Scobie C, CNM 12/22/2017, 1:13 PM

## 2017-12-22 NOTE — Progress Notes (Signed)
CSW received consult due to score 18 on Edinburgh Depression Screen.    CSW met with MOB via bedside to provide any support needed and discuss Edinburgh score. MOB was very tearful during conversation but pleasant. MOB voice feeling anxious about discharging home with new born after her baby passed away in March from complications with the flu. MOB states she is feeling nervous because it is cold outside and it is flu season so she is feeling even more worried that baby will get sick. MOB was a bit scattered in our conversation and would jump from talking about being sad to talking about needing to get medication. MOB stated she is feeling overwhelmed because she feels that she has to take care of everything her self and she is not able to ask anyone for help. MOB currently lives with her 2 other children; ages 4 and 1, with her parents. MOB voices that they are able to help her and are very supportive however she does not like to ask them for help. MOB states her primary support person is her best friend Tracy- MOB states they both have children and get together very frequently. MOB denied any thoughts of suicide/ homicide but did voice feeling depressed at this time and would like to discuss medication options. MOB has previously received counseling services at [facility] and would like to start going back.   CSW provided education regarding Baby Blues vs PMADs and provided MOB with resources for mental health follow up.  CSW encouraged MOB to evaluate her mental health throughout the postpartum period with the use of the New Mom Checklist developed by Postpartum Progress as well as the Edinburgh Postnatal Depression Scale and notify a medical professional if symptoms arise.    CSW will add information for [facility] to AVS. CSW also notified RN that patient was wanting to talk about medication options for depression. No other concerns at this time.   Daved Mcfann, LCSW Clinical Social Worker  [facility]  [phone]   

## 2017-12-22 NOTE — Anesthesia Postprocedure Evaluation (Signed)
Anesthesia Post Note  Patient: Milessa A Chalk  Procedure(s) Performed: AN AD HOC LABOR EPIDURAL     Patient location during evaluation: Mother Baby Anesthesia Type: Epidural Level of consciousness: awake and alert and oriented Pain management: satisfactory to patient Vital Signs Assessment: post-procedure vital signs reviewed and stable Respiratory status: respiratory function stable Cardiovascular status: stable Postop Assessment: no headache, no backache, epidural receding, patient able to bend at knees, no signs of nausea or vomiting and adequate PO intake Anesthetic complications: no    Last Vitals:  Vitals:   12/22/17 0005 12/22/17 0420  BP: 118/87 136/74  Pulse: 63 (!) 56  Resp: 18 18  Temp: 36.7 C 36.8 C  SpO2: 100% 100%    Last Pain:  Vitals:   12/22/17 0555  TempSrc:   PainSc: 6    Pain Goal: Patients Stated Pain Goal: 9 (12/21/17 0731)               Karleen DolphinFUSSELL,Pattiann Solanki

## 2017-12-23 ENCOUNTER — Encounter: Payer: Self-pay | Admitting: Obstetrics & Gynecology

## 2017-12-23 MED ORDER — SERTRALINE HCL 25 MG PO TABS
25.0000 mg | ORAL_TABLET | Freq: Every day | ORAL | Status: DC
  Administered 2017-12-23: 25 mg via ORAL
  Filled 2017-12-23: qty 1

## 2017-12-23 MED ORDER — SERTRALINE HCL 25 MG PO TABS: 25.0000 mg | ORAL_TABLET | Freq: Every day | ORAL | 1 refills | Status: DC

## 2017-12-23 MED ORDER — PANTOPRAZOLE SODIUM 40 MG PO TBEC
40.0000 mg | DELAYED_RELEASE_TABLET | Freq: Every day | ORAL | Status: DC
  Administered 2017-12-23: 40 mg via ORAL
  Filled 2017-12-23: qty 1

## 2017-12-23 MED ORDER — SENNOSIDES-DOCUSATE SODIUM 8.6-50 MG PO TABS: 2.0000 | ORAL_TABLET | ORAL | 0 refills | Status: DC

## 2017-12-23 MED ORDER — IBUPROFEN 600 MG PO TABS: 600.0000 mg | ORAL_TABLET | Freq: Four times a day (QID) | ORAL | 0 refills | Status: DC

## 2017-12-23 MED ORDER — PANTOPRAZOLE SODIUM 40 MG PO TBEC: 40.0000 mg | DELAYED_RELEASE_TABLET | Freq: Every day | ORAL | 1 refills | Status: DC

## 2017-12-23 MED ORDER — OXYCODONE-ACETAMINOPHEN 5-325 MG PO TABS: 1.0000 | ORAL_TABLET | ORAL | 0 refills | Status: DC | PRN

## 2017-12-23 MED ORDER — ALUM & MAG HYDROXIDE-SIMETH 200-200-20 MG/5ML PO SUSP
30.0000 mL | Freq: Once | ORAL | Status: AC
  Administered 2017-12-23: 30 mL via ORAL
  Filled 2017-12-23: qty 30

## 2017-12-23 NOTE — Discharge Instructions (Signed)
Postpartum Care After Vaginal Delivery °The period of time right after you deliver your newborn is called the postpartum period. °What kind of medical care will I receive? °· You may continue to receive fluids and medicines through an IV tube inserted into one of your veins. °· If an incision was made near your vagina (episiotomy) or if you had some vaginal tearing during delivery, cold compresses may be placed on your episiotomy or your tear. This helps to reduce pain and swelling. °· You may be given a squirt bottle to use when you go to the bathroom. You may use this until you are comfortable wiping as usual. To use the squirt bottle, follow these steps: °? Before you urinate, fill the squirt bottle with warm water. Do not use hot water. °? After you urinate, while you are sitting on the toilet, use the squirt bottle to rinse the area around your urethra and vaginal opening. This rinses away any urine and blood. °? You may do this instead of wiping. As you start healing, you may use the squirt bottle before wiping yourself. Make sure to wipe gently. °? Fill the squirt bottle with clean water every time you use the bathroom. °· You will be given sanitary pads to wear. °How can I expect to feel? °· You may not feel the need to urinate for several hours after delivery. °· You will have some soreness and pain in your abdomen and vagina. °· If you are breastfeeding, you may have uterine contractions every time you breastfeed for up to several weeks postpartum. Uterine contractions help your uterus return to its normal size. °· It is normal to have vaginal bleeding (lochia) after delivery. The amount and appearance of lochia is often similar to a menstrual period in the first week after delivery. It will gradually decrease over the next few weeks to a dry, yellow-brown discharge. For most women, lochia stops completely by 6-8 weeks after delivery. Vaginal bleeding can vary from woman to woman. °· Within the first few  days after delivery, you may have breast engorgement. This is when your breasts feel heavy, full, and uncomfortable. Your breasts may also throb and feel hard, tightly stretched, warm, and tender. After this occurs, you may have milk leaking from your breasts. Your health care provider can help you relieve discomfort due to breast engorgement. Breast engorgement should go away within a few days. °· You may feel more sad or worried than normal due to hormonal changes after delivery. These feelings should not last more than a few days. If these feelings do not go away after several days, speak with your health care provider. °How should I care for myself? °· Tell your health care provider if you have pain or discomfort. °· Drink enough water to keep your urine clear or pale yellow. °· Wash your hands thoroughly with soap and water for at least 20 seconds after changing your sanitary pads, after using the toilet, and before holding or feeding your baby. °· If you are not breastfeeding, avoid touching your breasts a lot. Doing this can make your breasts produce more milk. °· If you become weak or lightheaded, or you feel like you might faint, ask for help before: °? Getting out of bed. °? Showering. °· Change your sanitary pads frequently. Watch for any changes in your flow, such as a sudden increase in volume, a change in color, the passing of large blood clots. If you pass a blood clot from your vagina, save it   to show to your health care provider. Do not flush blood clots down the toilet without having your health care provider look at them. °· Make sure that all your vaccinations are up to date. This can help protect you and your baby from getting certain diseases. You may need to have immunizations done before you leave the hospital. °· If desired, talk with your health care provider about methods of family planning or birth control (contraception). °How can I start bonding with my baby? °Spending as much time as  possible with your baby is very important. During this time, you and your baby can get to know each other and develop a bond. Having your baby stay with you in your room (rooming in) can give you time to get to know your baby. Rooming in can also help you become comfortable caring for your baby. Breastfeeding can also help you bond with your baby. °How can I plan for returning home with my baby? °· Make sure that you have a car seat installed in your vehicle. °? Your car seat should be checked by a certified car seat installer to make sure that it is installed safely. °? Make sure that your baby fits into the car seat safely. °· Ask your health care provider any questions you have about caring for yourself or your baby. Make sure that you are able to contact your health care provider with any questions after leaving the hospital. °This information is not intended to replace advice given to you by your health care provider. Make sure you discuss any questions you have with your health care provider. °Document Released: 11/19/2006 Document Revised: 06/27/2015 Document Reviewed: 12/27/2014 °Elsevier Interactive Patient Education © 2018 [employer] Inc. ° °

## 2017-12-23 NOTE — Discharge Summary (Signed)
OB Discharge Summary     Patient Name: Joy Rivera DOB: 1990/11/04 MRN: [id number]  Date of admission: 12/20/2017 Delivering MD: Enis Slipper H   Date of discharge: 12/23/2017  Admitting diagnosis: CTX Intrauterine pregnancy: [redacted]w[redacted]d     Secondary diagnosis:  Active Problems:   Genital herpes   History of preterm delivery, currently pregnant   Supervision of high risk pregnancy, antepartum   History of pre-eclampsia with severe features    Gestational hypertension   Limited prenatal care  Additional problems: History of placental abruption, GBS positive     Discharge diagnosis: Term Pregnancy Delivered, Gestational Hypertension and genital herpes                                                                                                Post partum procedures:None  Augmentation: None  Complications: None  Hospital course:  Onset of Labor With Vaginal Delivery     27 y.o. yo [id number] at [redacted]w[redacted]d was admitted in Active Labor on 12/20/2017. Patient had an uncomplicated labor course as follows:  Membrane Rupture Time/Date: 9:21 AM ,12/21/2017   Intrapartum Procedures: Episiotomy: None [1]                                         Lacerations:  None [1]  Patient had a delivery of a Viable infant. GBS treated with penicillin intrapartum 12/21/2017  Information for the patient's newborn:  Josseline, Reddin [[id number]]  Delivery Method: Vaginal, Spontaneous(Filed from Delivery Summary)    Pateint had an uncomplicated postpartum course.  She is ambulating, tolerating a regular diet, passing flatus, and urinating well. Patient is discharged home in stable condition on 12/23/17. Mild epigastric abdominal pain this morning, controlled with pain medication. GI cocktail given. Blood pressure well controlled after delivery. Started on Zoloft 25 mg for Edinburgh score of 18. Patient to follow up with outpatient therapy.    Physical exam  Vitals:   12/22/17 0420 12/22/17 1419  12/22/17 2020 12/23/17 0445  BP: 136/74 (!) 127/29 131/80 127/82  Pulse: (!) 56 (!) 53 (!) 59 62  Resp: 18 18 18 18   Temp: 98.3 F (36.8 C) 97.8 F (36.6 C) 98.4 F (36.9 C) 98.3 F (36.8 C)  TempSrc: Axillary Axillary Axillary Axillary  SpO2: 100%  96% 97%  Weight:      Height:       General: alert, cooperative and no distress Lochia: appropriate Uterine Fundus: soft Incision: N/A DVT Evaluation: No evidence of DVT seen on physical exam. Negative Homan's sign. No cords or calf tenderness. No significant calf/ankle edema. Labs: Lab Results  Component Value Date   WBC 6.1 12/21/2017   HGB 10.7 (L) 12/21/2017   HCT 33.7 (L) 12/21/2017   MCV 80.4 12/21/2017   PLT 221 12/21/2017   CMP Latest Ref Rng & Units 12/19/2017  Glucose 70 - 99 mg/dL 75  BUN 6 - 20 mg/dL 10  Creatinine 1.30 - 8.65 mg/dL 7.84  Sodium 696 - 295 mmol/L 136  Potassium 3.5 - 5.1 mmol/L 3.9  Chloride 98 - 111 mmol/L 104  CO2 22 - 32 mmol/L 25  Calcium 8.9 - 10.3 mg/dL 8.3(L)  Total Protein 6.5 - 8.1 g/dL 6.6  Total Bilirubin 0.3 - 1.2 mg/dL 0.5  Alkaline Phos 38 - 126 U/L 191(H)  AST 15 - 41 U/L 14(L)  ALT 0 - 44 U/L 10    Discharge instruction: per After Visit Summary and "Baby and Me Booklet".  After visit meds:  Allergies as of 12/23/2017   No Known Allergies     Medication List    STOP taking these medications   aspirin EC 81 MG tablet   famotidine 20 MG tablet Commonly known as:  PEPCID   valACYclovir 500 MG tablet Commonly known as:  VALTREX     TAKE these medications   acetaminophen 500 MG tablet Commonly known as:  TYLENOL Take 1,000 mg by mouth every 6 (six) hours as needed for moderate pain or headache.   ibuprofen 600 MG tablet Commonly known as:  ADVIL,MOTRIN Take 1 tablet (600 mg total) by mouth every 6 (six) hours.   oxyCODONE-acetaminophen 5-325 MG tablet Commonly known as:  PERCOCET/ROXICET Take 1-2 tablets by mouth every 4 (four) hours as needed for moderate  pain.   pantoprazole 40 MG tablet Commonly known as:  PROTONIX Take 1 tablet (40 mg total) by mouth daily.   PRENATAL COMPLETE 14-0.4 MG Tabs Take 1 tablet by mouth daily.   senna-docusate 8.6-50 MG tablet Commonly known as:  Senokot-S Take 2 tablets by mouth daily. Start taking on:  12/24/2017   sertraline 25 MG tablet Commonly known as:  ZOLOFT Take 1 tablet (25 mg total) by mouth daily.       Diet: low salt diet  Activity: Advance as tolerated. Pelvic rest for 6 weeks.   Outpatient follow up:2 weeks, will need close follow up for PPD and [facility]. Scored 18 on Edinburgh. CSW recommends close follow up. [facility].    -will start on Zoloft upon discharge for PPD. Denies SI/HI  Follow up Appt: Future Appointments  Date Time Provider Department Center  12/26/2017 10:30 AM [facility] US 1 [facility]   Follow up Visit:No follow-ups on file.  Postpartum contraception: Nexplanon  Newborn Data: Live born female  Birth Weight: 5 lb 13 oz (2637 g) APGAR: 9, 9  Newborn Delivery   Birth date/time:  12/21/2017 10:30:00 Delivery type:  Vaginal, Spontaneous     Baby Feeding: Bottle Disposition:home with mother   Marcy Sirenatherine Camden Mazzaferro, D.O. [facility] Fellow, [facility], [facility] 12/23/2017, 8:01 AM

## 2017-12-24 ENCOUNTER — Telehealth: Payer: Self-pay | Admitting: *Deleted

## 2017-12-24 NOTE — Telephone Encounter (Signed)
Pt called to report that she is having abdominal pain in the top of her stomach that is causing her quite a bit of discomfort.  Pt reports that the medication she was prescribed is not helping.  Pt advised to come into the after hours clinic to see a provider in regards to her pain. Pt is agreeable to that and verbalized understanding.

## 2017-12-26 ENCOUNTER — Encounter (HOSPITAL_COMMUNITY): Payer: Self-pay

## 2017-12-26 ENCOUNTER — Ambulatory Visit (HOSPITAL_COMMUNITY)
Admission: RE | Admit: 2017-12-26 | Discharge: 2017-12-26 | Disposition: A | Discharge status: Home / Self Care | Payer: Medicaid Other | Source: Ambulatory Visit | Attending: Advanced Practice Midwife | Admitting: Advanced Practice Midwife

## 2018-01-06 ENCOUNTER — Institutional Professional Consult (permissible substitution): Payer: Self-pay

## 2018-01-06 ENCOUNTER — Ambulatory Visit (INDEPENDENT_AMBULATORY_CARE_PROVIDER_SITE_OTHER): Payer: Medicaid Other | Admitting: Clinical

## 2018-01-06 ENCOUNTER — Ambulatory Visit (INDEPENDENT_AMBULATORY_CARE_PROVIDER_SITE_OTHER): Payer: Medicaid Other | Admitting: *Deleted

## 2018-01-06 VITALS — BP 138/93 | HR 65 | Wt 170.2 lb

## 2018-01-06 DIAGNOSIS — F4323 Adjustment disorder with mixed anxiety and depressed mood: Secondary | ICD-10-CM | POA: Diagnosis not present

## 2018-01-06 DIAGNOSIS — Z0130 Encounter for examination of blood pressure without abnormal findings: Secondary | ICD-10-CM

## 2018-01-06 NOTE — Progress Notes (Signed)
Here for bp check. Also c/o pain in upper upper stomach everytime you eat , drink or have a bowel movement. Reviewed meds with her and she is not taking protonix, advised her to take this to help with stomach pain. Bp elevated - discussed with Dr.Dove and advised return in one week for bp check. Also patient states was unable to pick up zoloft- called pharmacy to make sure they have order - patient to pick up today. Also took patient [facility]Jamie, [facility]. Analy Bassford,RN

## 2018-01-06 NOTE — BH Specialist Note (Signed)
Integrated Behavioral Health Follow Up Visit  MRN: [id number] Name: Joy Rivera  Number of Integrated Behavioral Health Clinician visits: 2/6 Session Start time: 9:38  Session End time: 10:34 Total time: 50 minutes  Type of Service: Integrated Behavioral Health- Individual/Family Interpretor:No. Interpretor Name and Language: n/a  SUBJECTIVE: Joy Rivera is a 27 y.o. female accompanied by n/a  Patient was referred by Scheryl Darter, MD for follow up postpartum  Patient reports the following symptoms/concerns: Pt states feeling depressed and anxious postpartum, which she attributes to unmet expectations regarding level of support from family and FOB, along with feeling overly worried about her newborn daughter, after losing her first daughter at 3months to flu complications. Pt also concerned about high blood pressure and pain in abdomen when she eats, low appetite, fatigue, lack of quality sleep. Duration of problem: Postpartum, 2 weeks; Severity of problem: moderate  OBJECTIVE: Mood: Anxious and Depressed and Affect: Appropriate Risk of harm to self or others: No plan to harm self or others  LIFE CONTEXT: Family and Social: Pt lives with her 3 children (4,1; newborn) and her parents; FOB lives in his mother's home School/Work: Maternity leave (Pt regrets going back to work after last pregnancy at 2 weeks) Self-Care: Recognizing greater need for self care Life Changes: Recent childbirth   GOALS ADDRESSED: Patient will: 1.  Reduce symptoms of: anxiety and depression  2.  Increase knowledge and/or ability of: healthy habits  3.  Demonstrate ability to: Increase healthy adjustment to current life circumstances and Increase adequate support systems for patient/family  INTERVENTIONS: Interventions utilized:  Supportive Counseling, Psychoeducation and/or Health Education and Link to [employer] Standardized Assessments completed: Not Needed today  ASSESSMENT: Patient  currently experiencing Adjustment disorder with mixed anxious and depressed mood.   Patient may benefit from continued brief therapeutic interventions regarding coping with symptoms of anxiety and depression.  PLAN: 1. Follow up with behavioral health clinician on : Two days via phone [facility] med management call 2. Behavioral recommendations:  -Begin taking Zoloft today, as prescribed by medical provider -Continue taking prenatal vitamins, until postpartum visit; discuss with medical provider at that time -Attend Mom Talk support group tomorrow, 01-07-18 at [facility]; continue weekly for as long as remains helpful for support -Come back in one week for BP check, with medical provider -Continue with plans to establish care with [facility] for ongoing BH med management and therapy 3. Referral(s): Integrated Art gallery manager (In Clinic) and [facility] (LME/Outside Clinic) 4. "From scale of 1-10, how likely are you to follow plan?": -  Rae Lips, LCSW  Depression screen [facility] 2/9 10/14/2017 01/10/2017 10/23/2016  Decreased Interest 3 2 1   Down, Depressed, Hopeless 3 1 1   PHQ - 2 Score 6 3 2   Altered sleeping 3 3 1   Tired, decreased energy 3 3 1   Change in appetite 3 3 1   Feeling bad or failure about yourself  3 2 1   Trouble concentrating 1 0 1  Moving slowly or fidgety/restless 1 0 0  Suicidal thoughts 0 0 0  PHQ-9 Score 20 14 7    GAD 7 : Generalized Anxiety Score 10/14/2017 01/10/2017 10/23/2016  Nervous, Anxious, on Edge 2 2 1   Control/stop worrying 3 3 1   Worry too much - different things 3 3 1   Trouble relaxing 3 3 1   Restless 1 0 0  Easily annoyed or irritable 3 3 3   Afraid - awful might happen 3 0 0  Total GAD 7 Score 18 14 7

## 2018-01-07 NOTE — Progress Notes (Signed)
I have reviewed this chart and agree with the RN/CMA assessment and management.    Makayela Secrest C Taela Charbonneau, MD, FACOG Attending Physician, Faculty Practice [facility]  

## 2018-01-17 ENCOUNTER — Other Ambulatory Visit: Payer: Self-pay

## 2018-01-17 ENCOUNTER — Emergency Department (HOSPITAL_COMMUNITY)
Admission: EM | Admit: 2018-01-17 | Discharge: 2018-01-17 | Disposition: A | Discharge status: Home / Self Care | Payer: Medicaid Other | Attending: Emergency Medicine | Admitting: Emergency Medicine

## 2018-01-17 DIAGNOSIS — J101 Influenza due to other identified influenza virus with other respiratory manifestations: Secondary | ICD-10-CM | POA: Insufficient documentation

## 2018-01-17 DIAGNOSIS — Z79899 Other long term (current) drug therapy: Secondary | ICD-10-CM | POA: Diagnosis not present

## 2018-01-17 DIAGNOSIS — R6889 Other general symptoms and signs: Secondary | ICD-10-CM

## 2018-01-17 DIAGNOSIS — R05 Cough: Secondary | ICD-10-CM | POA: Diagnosis present

## 2018-01-17 DIAGNOSIS — I10 Essential (primary) hypertension: Secondary | ICD-10-CM | POA: Insufficient documentation

## 2018-01-17 LAB — INFLUENZA PANEL BY PCR (TYPE A & B)
INFLBPCR: POSITIVE — AB
Influenza A By PCR: NEGATIVE

## 2018-01-17 NOTE — ED Notes (Signed)
Patient verbalizes understanding of discharge instructions. Opportunity for questioning and answers were provided. 

## 2018-01-17 NOTE — Discharge Instructions (Addendum)
You were evaluated in the emergency department for an upper respiratory illness possibly the flu.  Your flu test was pending at the time of discharge.  Please continue to stay well-hydrated and use Tylenol and ibuprofen for fever and pain.  Follow-up with your doctor or return if any worsening symptoms.

## 2018-01-17 NOTE — ED Provider Notes (Signed)
[facility] EMERGENCY DEPARTMENT Provider Note   CSN: [id number] Arrival date & time: 01/17/18  1123     History   Chief Complaint Chief Complaint  Patient presents with  . Influenza    HPI Joy Rivera is a 27 y.o. female.  She is complaining of 3 to 4 days of sneezing sore throat cough productive of some sputum feeling hot and cold and intermittent headache.  She is been taking some over-the-counter medications with some relief.  She had multiple close household contacts who are positive for flu.  She is mostly concerned because she has a child in the NICU and she does not want to be flu positive if she is going to see them.  The history is provided by the patient.  Influenza  Presenting symptoms: cough, headache, rhinorrhea and sore throat   Presenting symptoms: no diarrhea, no fever, no myalgias, no shortness of breath and no vomiting   Severity:  Moderate Onset quality:  Gradual Progression:  Unchanged Chronicity:  New Relieved by:  OTC medications Worsened by:  Nothing Associated symptoms: ear pain and nasal congestion   Associated symptoms: no mental status change, no neck stiffness and no syncope   Risk factors: sick contacts     Past Medical History:  Diagnosis Date  . Abnormal Pap smear   . Anxiety   . Depression   . Hypertension    chronic    Patient Active Problem List   Diagnosis Date Noted  . Limited prenatal care 12/21/2017  . Gestational hypertension 12/20/2017  . Supervision of high risk pregnancy, antepartum 10/14/2017  . History of pre-eclampsia with severe features  10/14/2017  . 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 History of preterm delivery, currently pregnant 10/23/2016  . History of placenta abruption 10/23/2016  . Genital herpes 01/02/2016  . Genital warts complicating pregnancy 01/02/2016    Past Surgical History:  Procedure Laterality Date  .  THERAPEUTIC ABORTION       OB History    Gravida  7   Para  4   Term  2   Preterm  2   AB  3   Living  3     SAB  0   TAB  2   Ectopic  1   Multiple  0   Live Births  4            Home Medications    Prior to Admission medications   Medication Sig Start Date End Date Taking? Authorizing Provider  acetaminophen (TYLENOL) 500 MG tablet Take 1,000 mg by mouth every 6 (six) hours as needed for moderate pain or headache.    [provider]  ibuprofen (ADVIL,MOTRIN) 600 MG tablet Take 1 tablet (600 mg total) by mouth every 6 (six) hours. 12/23/17   Arvilla Market, DO  oxyCODONE-acetaminophen (PERCOCET/ROXICET) 5-325 MG tablet Take 1-2 tablets by mouth every 4 (four) hours as needed for moderate pain. Patient not taking: Reported on 01/06/2018 12/23/17   Arvilla Market, DO  pantoprazole (PROTONIX) 40 MG tablet Take 1 tablet (40 mg total) by mouth daily. Patient not taking: Reported on 01/06/2018 12/23/17   Arvilla Market, DO  Prenatal Vit-Fe Fumarate-FA (PRENATAL COMPLETE) 14-0.4 MG TABS Take 1 tablet by mouth daily. Patient not taking: Reported on 12/19/2017 10/11/16   Renne Crigler, PA-C  senna-docusate (SENOKOT-S) 8.6-50 MG tablet Take 2 tablets by mouth daily. 12/24/17   Marcy Siren  Lauren, DO  sertraline (ZOLOFT) 25 MG tablet Take 1 tablet (25 mg total) by mouth daily. Patient not taking: Reported on 01/06/2018 12/23/17   Arvilla MarketWallace, Catherine Lauren, DO    Family History Family History  Problem Relation Age of Onset  . Hypertension Father   . Heart disease Father   . Cancer Maternal Aunt   . Cancer Maternal Uncle   . Hypertension Paternal Grandmother   . Cancer Maternal Grandmother   . Cancer Maternal Grandfather     Social History Social History   Tobacco Use  . Smoking status: Never Smoker  . Smokeless tobacco: Never Used  Substance Use Topics  . Alcohol use: No  . Drug use: No     Allergies     Patient has no known allergies.   Review of Systems Review of Systems  Constitutional: Negative for fever.  HENT: Positive for congestion, ear pain, rhinorrhea and sore throat.   Eyes: Negative for visual disturbance.  Respiratory: Positive for cough. Negative for shortness of breath.   Cardiovascular: Negative for chest pain.  Gastrointestinal: Negative for abdominal pain, diarrhea and vomiting.  Genitourinary: Negative for dysuria.  Musculoskeletal: Negative for myalgias and neck stiffness.  Skin: Negative for rash.  Neurological: Positive for headaches.     Physical Exam Updated Vital Signs BP (!) 144/95   Pulse (!) 111   Temp 98.2 F (36.8 C) (Oral)   Resp 18   Ht 5\' 5"  (1.651 m)   Wt 77.2 kg   SpO2 99%   BMI 28.32 kg/m   Physical Exam Vitals signs and nursing note reviewed.  Constitutional:      General: She is not in acute distress.    Appearance: She is well-developed.  HENT:     Head: Normocephalic and atraumatic.     Right Ear: Tympanic membrane and ear canal normal.     Left Ear: Tympanic membrane and ear canal normal.     Nose: Nose normal.     Mouth/Throat:     Mouth: Mucous membranes are moist.     Pharynx: Posterior oropharyngeal erythema present. No oropharyngeal exudate.  Eyes:     Conjunctiva/sclera: Conjunctivae normal.  Neck:     Musculoskeletal: Neck supple.  Cardiovascular:     Rate and Rhythm: Regular rhythm. Tachycardia present.     Heart sounds: No murmur.  Pulmonary:     Effort: Pulmonary effort is normal. No respiratory distress.     Breath sounds: Normal breath sounds.  Abdominal:     Palpations: Abdomen is soft.     Tenderness: There is no abdominal tenderness.  Musculoskeletal: Normal range of motion.        General: No tenderness.  Lymphadenopathy:     Cervical: No cervical adenopathy.  Skin:    General: Skin is warm and dry.  Neurological:     General: No focal deficit present.     Mental Status: She is alert.       ED Treatments / Results  Labs (all labs ordered are listed, but only abnormal results are displayed) Labs Reviewed  INFLUENZA PANEL BY PCR (TYPE A & B) - Abnormal; Notable for the following components:      Result Value   Influenza B By PCR POSITIVE (*)    All other components within normal limits    EKG None  Radiology No results found.  Procedures Procedures (including critical care time)  Medications Ordered in ED Medications - No data to display   Initial Impression /  Assessment and Plan / ED Course  I have reviewed the triage vital signs and the nursing notes.  Pertinent labs & imaging results that were available during my care of the patient were reviewed by me and considered in my medical decision making (see chart for details).    Patient understands to return if worsening symptoms.   Final Clinical Impressions(s) / ED Diagnoses   Final diagnoses:  Flu-like symptoms    ED Discharge Orders    None       Terrilee Files, MD 01/18/18 [phone]

## 2018-01-17 NOTE — ED Triage Notes (Signed)
Pt arrives via pov with reports of cough, sore throat, sneezing, headaches, chills onset 3 days ago. Reports niece was just diagnosed with the flu last week.

## 2018-01-27 ENCOUNTER — Ambulatory Visit: Payer: Self-pay | Admitting: Family Medicine

## 2018-02-12 ENCOUNTER — Ambulatory Visit: Payer: Self-pay | Admitting: Medical

## 2018-02-19 ENCOUNTER — Encounter: Payer: Self-pay | Admitting: Family Medicine

## 2018-02-19 ENCOUNTER — Ambulatory Visit (INDEPENDENT_AMBULATORY_CARE_PROVIDER_SITE_OTHER): Payer: Medicaid Other | Admitting: Nurse Practitioner

## 2018-02-19 ENCOUNTER — Ambulatory Visit (INDEPENDENT_AMBULATORY_CARE_PROVIDER_SITE_OTHER): Payer: Medicaid Other | Admitting: Clinical

## 2018-02-19 ENCOUNTER — Encounter: Payer: Self-pay | Admitting: Nurse Practitioner

## 2018-02-19 DIAGNOSIS — Z1389 Encounter for screening for other disorder: Secondary | ICD-10-CM

## 2018-02-19 DIAGNOSIS — F4323 Adjustment disorder with mixed anxiety and depressed mood: Secondary | ICD-10-CM

## 2018-02-19 DIAGNOSIS — Z634 Disappearance and death of family member: Secondary | ICD-10-CM

## 2018-02-19 DIAGNOSIS — Z30017 Encounter for initial prescription of implantable subdermal contraceptive: Secondary | ICD-10-CM | POA: Diagnosis not present

## 2018-02-19 DIAGNOSIS — F4321 Adjustment disorder with depressed mood: Secondary | ICD-10-CM | POA: Insufficient documentation

## 2018-02-19 HISTORY — DX: Adjustment disorder with depressed mood: F43.21

## 2018-02-19 HISTORY — DX: Disappearance and death of family member: Z63.4

## 2018-02-19 LAB — POCT PREGNANCY, URINE: PREG TEST UR: NEGATIVE

## 2018-02-19 MED ORDER — ETONOGESTREL 68 MG ~~LOC~~ IMPL
68.0000 mg | DRUG_IMPLANT | Freq: Once | SUBCUTANEOUS | Status: AC
  Administered 2018-02-19: 68 mg via SUBCUTANEOUS

## 2018-02-19 MED ORDER — SERTRALINE HCL 50 MG PO TABS: 50.0000 mg | ORAL_TABLET | Freq: Every day | ORAL | 1 refills | Status: DC

## 2018-02-19 NOTE — Patient Instructions (Addendum)
Be seen in the office if the bleeding you are having with Nexplanon is a problem. Condoms with intercourse for 2 weeks. Be seen by your Primary Care doctor within 2 months to have your blood pressure evaluated. Today it is higher than 130/80.  You may need medication for your blood pressure. Consider taking Zoloft if you need it for your mood. Continue to work on healthy eating and increasing exercise to take your weight down until the BMI is less than 25.  Etonogestrel implant What is this medicine? ETONOGESTREL (et oh noe JES trel) is a contraceptive (birth control) device. It is used to prevent pregnancy. It can be used for up to 3 years. This medicine may be used for other purposes; ask your health care provider or pharmacist if you have questions. COMMON BRAND NAME(S): Implanon, Nexplanon What should I tell my health care provider before I take this medicine? They need to know if you have any of these conditions: -abnormal vaginal bleeding -blood vessel disease or blood clots -breast, cervical, endometrial, ovarian, liver, or uterine cancer -diabetes -gallbladder disease -heart disease or recent heart attack -high blood pressure -high cholesterol or triglycerides -kidney disease -liver disease -migraine headaches -seizures -stroke -tobacco smoker -an unusual or allergic reaction to etonogestrel, anesthetics or antiseptics, other medicines, foods, dyes, or preservatives -pregnant or trying to get pregnant -breast-feeding How should I use this medicine? This device is inserted just under the skin on the inner side of your upper arm by a health care professional. Talk to your pediatrician regarding the use of this medicine in children. Special care may be needed. Overdosage: If you think you have taken too much of this medicine contact a poison control center or emergency room at once. NOTE: This medicine is only for you. Do not share this medicine with others. What if I miss a  dose? This does not apply. What may interact with this medicine? Do not take this medicine with any of the following medications: -amprenavir -fosamprenavir This medicine may also interact with the following medications: -acitretin -aprepitant -armodafinil -bexarotene -bosentan -carbamazepine -certain medicines for fungal infections like fluconazole, ketoconazole, itraconazole and voriconazole -certain medicines to treat hepatitis, HIV or AIDS -cyclosporine -felbamate -griseofulvin -lamotrigine -modafinil -oxcarbazepine -phenobarbital -phenytoin -primidone -rifabutin -rifampin -rifapentine -St. John's wort -topiramate This list may not describe all possible interactions. Give your health care provider a list of all the medicines, herbs, non-prescription drugs, or dietary supplements you use. Also tell them if you smoke, drink alcohol, or use illegal drugs. Some items may interact with your medicine. What should I watch for while using this medicine? This product does not protect you against HIV infection (AIDS) or other sexually transmitted diseases. You should be able to feel the implant by pressing your fingertips over the skin where it was inserted. Contact your doctor if you cannot feel the implant, and use a non-hormonal birth control method (such as condoms) until your doctor confirms that the implant is in place. Contact your doctor if you think that the implant may have broken or become bent while in your arm. You will receive a user card from your health care provider after the implant is inserted. The card is a record of the location of the implant in your upper arm and when it should be removed. Keep this card with your health records. What side effects may I notice from receiving this medicine? Side effects that you should report to your doctor or health care professional as soon as possible: -  allergic reactions like skin rash, itching or hives, swelling of the face,  lips, or tongue -breast lumps, breast tissue changes, or discharge -breathing problems -changes in emotions or moods -if you feel that the implant may have broken or bent while in your arm -high blood pressure -pain, irritation, swelling, or bruising at the insertion site -scar at site of insertion -signs of infection at the insertion site such as fever, and skin redness, pain or discharge -signs and symptoms of a blood clot such as breathing problems; changes in vision; chest pain; severe, sudden headache; pain, swelling, warmth in the leg; trouble speaking; sudden numbness or weakness of the face, arm or leg -signs and symptoms of liver injury like dark yellow or brown urine; general ill feeling or flu-like symptoms; light-colored stools; loss of appetite; nausea; right upper belly pain; unusually weak or tired; yellowing of the eyes or skin -unusual vaginal bleeding, discharge Side effects that usually do not require medical attention (report to your doctor or health care professional if they continue or are bothersome): -acne -breast pain or tenderness -headache -irregular menstrual bleeding -nausea This list may not describe all possible side effects. Call your doctor for medical advice about side effects. You may report side effects to FDA at 1-800-FDA-1088. Where should I keep my medicine? This drug is given in a hospital or clinic and will not be stored at home. NOTE: This sheet is a summary. It may not cover all possible information. If you have questions about this medicine, talk to your doctor, pharmacist, or health care provider.  2019 Elsevier/Gold Standard (2016-12-11 14:11:42)

## 2018-02-19 NOTE — Progress Notes (Signed)
PT wants Nexplanon

## 2018-02-19 NOTE — Progress Notes (Signed)
Subjective:     Joy Rivera is a 28 y.o. female who presents for a postpartum visit. She is 8 weeks postpartum following a spontaneous vaginal delivery. I have fully reviewed the prenatal and intrapartum course. The delivery was an induction at [redacted]w[redacted]d gestational age for gestational hypertension.  Outcome: spontaneous vaginal delivery. Anesthesia: epidural. Postpartum course has been managed by client at home without medical assistance. Baby's course - has needed lots of medical care and was hospitalized once with a UTI.  Client is very concerned about any illnesses the baby has as she had a child who died previously with the flu.  Baby is feeding by bottle - Gerber Gentle. Bleeding no bleeding. Bowel function is normal. Bladder function is normal. Patient  Is sexually active. Contraception method is condoms.  Had intercourse once in the past 2 weeks and used a condom.  Postpartum depression screening: positive and will see [facility] specialist in the office today.  Did not get any Zoloft from the pharmacy following her discharge.  "It was not in the bag of medications from the pharmacy."   Was having "good days and bad days".  Was trying to get by and hoping her days would be good.  Had her flu shot during the pregnancy in September.  Had the flu in December 2019.  Treated with Tamiflu.  History of gestational hypertension with this past pregnancy and history of preeclampsia with severe features in a previous pregnancy.  Was not discharged on medication for her blood pressure.  Depression screening reviewed.  Review of Systems Pertinent items noted in HPI and remainder of comprehensive ROS otherwise negative.   Objective:    BP 139/80   Pulse 94   Ht 5\' 3"  (1.6 m)   Wt 167 lb 11.2 oz (76.1 kg)   LMP 01/20/2018 (Approximate)   Breastfeeding No   BMI 29.71 kg/m   General:  alert, cooperative and flat affect.   Breasts:  not examined  Lungs: clear to auscultation bilaterally  Heart:  regular  rate and rhythm, S1, S2 normal, no murmur, click, rub or gallop  Abdomen: not examined   Vulva:  not evaluated  Vagina: not evaluated           Rectal Exam: Not performed.         GYNECOLOGY OFFICE PROCEDURE NOTE Nexplanon Insertion Patient identified, informed consent performed, consent signed.   Appropriate time out taken. Nexplanon site identified.  Area prepped in usual sterile fashon. Betadine wash to the area.  Three ml of 1% lidocaine was used to anesthetize the area for insertion.  The Nexplanon rod was inserted by company guidelines and was tolerated well.  There was minimal blood loss. There were no complications.  A pressure bandage was applied to reduce any bruising.  The patient tolerated the procedure well and was given post procedure instructions.  Patient is planning to use condoms for contraception/attempt conception.    Assessment:    postpartum exam suspicious for postpartum depression. Pap smear not done at today's visit.   Adjustment disorder with depressed mood Nexplanon insertion done.  Plan:    1. Contraception: Nexplanon.  Inserted today and advised condoms with all intercourse for the next 2 weeks. 2. Be seen by your primary care MD in 2 months to recheck your blood pressure.  It is borderline today and will need to be reevaluated to see if you need BP medication. 3. See [facility] provider today.  After discussion, decided that Zolft is needed, and  prescription sent to her pharmacy.  Will follow up with [facility] provider. 4. Continue weight loss to BMI less than 25 5. Follow up in: 12 months or as needed.    Nolene Bernheim, RN, MSN, NP-BC Nurse Practitioner, [facility] for [employer], [facility] 02/19/2018 1:25 PM

## 2018-02-19 NOTE — BH Specialist Note (Signed)
Integrated Behavioral Health Initial Visit  MRN: [id number] Name: Joy Rivera  Number of Integrated Behavioral Health Clinician visits:: 3/6 Session Start time: 10:59  Session End time: 11:34 Total time: 30 minutes  Type of Service: Integrated Behavioral Health- Individual/Family Interpretor:No. Interpretor Name and Language: n/a   Warm Hand Off Completed.       SUBJECTIVE: Joy Rivera is a 28 y.o. female accompanied by n/a Patient was referred by Chrissie Noa, NP for depression. Patient reports the following symptoms/concerns: Pt states her primary concern today is feeling depressed and uncertain about the direction of her life; would like to figure out how to take the first step towards her future.  Duration of problem: Postpartum; Severity of problem: moderate  OBJECTIVE: Mood: Depressed and Affect: Depressed Risk of harm to self or others: No plan to harm self or others  LIFE CONTEXT: Family and Social: Pt lives with her parents and children (5,1, 79mo) School/Work: Pt working part-time, goes back to full-time soon Self-Care: Beginning to [employer] manager needs Life Changes: Recent childbirth   GOALS ADDRESSED: Patient will: 1. Reduce symptoms of: depression 2. Increase knowledge and/or ability of: self-management skills  3. Demonstrate ability to: Increase healthy adjustment to current life circumstances and Improve medication compliance  INTERVENTIONS: Interventions utilized: Solution-Focused Strategies  Standardized Assessments completed: Inocente Salles Postnatal Depression  ASSESSMENT: Patient currently experiencing .Adjustment disorder with depressed and anxious mood.   Patient may benefit from continued brief therapeutic interventions regarding continuing to cope with symptoms of depression and anxiety.  PLAN: 1. Follow up with behavioral health clinician on : One week via phone (Report on Core Values assignment) 2. Behavioral recommendations:   -Begin taking BH medication, as prescribed by medical provider -Complete Core Value assignment within one week; call [facility] with top 3 values for 2020 3. Referral(s): Integrated Behavioral Health Services (In Clinic) 4. "From scale of 1-10, how likely are you to follow plan?": 10  Rae Lips, LCSW  Depression screen [facility] 2/9 10/14/2017 01/10/2017 10/23/2016  Decreased Interest 3 2 1   Down, Depressed, Hopeless 3 1 1   PHQ - 2 Score 6 3 2   Altered sleeping 3 3 1   Tired, decreased energy 3 3 1   Change in appetite 3 3 1   Feeling bad or failure about yourself  3 2 1   Trouble concentrating 1 0 1  Moving slowly or fidgety/restless 1 0 0  Suicidal thoughts 0 0 0  PHQ-9 Score 20 14 7    GAD 7 : Generalized Anxiety Score 10/14/2017 01/10/2017 10/23/2016  Nervous, Anxious, on Edge 2 2 1   Control/stop worrying 3 3 1   Worry too much - different things 3 3 1   Trouble relaxing 3 3 1   Restless 1 0 0  Easily annoyed or irritable 3 3 3   Afraid - awful might happen 3 0 0  Total GAD 7 Score 18 14 7     [place] on 02/19/18 @ 18

## 2018-02-19 NOTE — Addendum Note (Signed)
Addended by: Henrietta Dine on: 02/19/2018 02:17 PM   Modules accepted: Orders

## 2018-02-24 ENCOUNTER — Telehealth: Payer: Self-pay | Admitting: Clinical

## 2018-02-24 NOTE — Telephone Encounter (Signed)
Pt completed Core Value assignment from last visit, and is encouraged to continue with next step in assignment. Pt is aware she may come in for future appointments with [facility], as needed.

## 2018-04-03 ENCOUNTER — Encounter (HOSPITAL_COMMUNITY): Payer: Self-pay | Admitting: Emergency Medicine

## 2018-04-03 ENCOUNTER — Emergency Department (HOSPITAL_COMMUNITY)
Admission: EM | Admit: 2018-04-03 | Discharge: 2018-04-03 | Disposition: A | Discharge status: Home / Self Care | Payer: Medicaid Other | Attending: Emergency Medicine | Admitting: Emergency Medicine

## 2018-04-03 ENCOUNTER — Other Ambulatory Visit: Payer: Self-pay

## 2018-04-03 DIAGNOSIS — I10 Essential (primary) hypertension: Secondary | ICD-10-CM | POA: Diagnosis not present

## 2018-04-03 DIAGNOSIS — R05 Cough: Secondary | ICD-10-CM | POA: Diagnosis present

## 2018-04-03 DIAGNOSIS — Z79899 Other long term (current) drug therapy: Secondary | ICD-10-CM | POA: Insufficient documentation

## 2018-04-03 DIAGNOSIS — J111 Influenza due to unidentified influenza virus with other respiratory manifestations: Secondary | ICD-10-CM | POA: Insufficient documentation

## 2018-04-03 MED ORDER — OSELTAMIVIR PHOSPHATE 75 MG PO CAPS: 75.0000 mg | ORAL_CAPSULE | Freq: Two times a day (BID) | ORAL | 0 refills | Status: DC

## 2018-04-03 NOTE — ED Notes (Signed)
Patient verbalizes understanding of discharge instructions. Opportunity for questioning and answers were provided. Armband removed by staff, pt discharged from ED.  

## 2018-04-03 NOTE — ED Provider Notes (Signed)
[facility] EMERGENCY DEPARTMENT Provider Note   CSN: [id number] Arrival date & time: 04/03/18  1105    History   Chief Complaint Chief Complaint  Patient presents with  . Influenza    HPI Joy Rivera is a 28 y.o. female.     HPI   28 year old female presents today with complaints of respiratory infection.  Patient notes developing cough, rhinorrhea and subjective fever yesterday.  She notes her son was positive for influenza.  She has been using NyQuil, no recent meds prior to evaluation.  She denies any significant pulmonary history or any significant past medical history.  She denies any shortness of breath.     Past Medical History:  Diagnosis Date  . Abnormal Pap smear   . Anxiety   . Depression   . Hypertension    chronic    Patient Active Problem List   Diagnosis Date Noted  . Adjustment disorder with depressed mood 2018-03-19  . Death of child 03-19-18  . Genital herpes 01/02/2016    Past Surgical History:  Procedure Laterality Date  . THERAPEUTIC ABORTION       OB History    Gravida  7   Para  4   Term  2   Preterm  2   AB  3   Living  3     SAB  0   TAB  2   Ectopic  1   Multiple  0   Live Births  4            Home Medications    Prior to Admission medications   Medication Sig Start Date End Date Taking? Authorizing Provider  acetaminophen (TYLENOL) 500 MG tablet Take 1,000 mg by mouth every 6 (six) hours as needed for moderate pain or headache.    [provider]  oseltamivir (TAMIFLU) 75 MG capsule Take 1 capsule (75 mg total) by mouth every 12 (twelve) hours. 04/03/18   Aviel Davalos, Tinnie Gens, PA-C  Prenatal Vit-Fe Fumarate-FA (PRENATAL COMPLETE) 14-0.4 MG TABS Take 1 tablet by mouth daily. 10/11/16   Renne Crigler, PA-C  sertraline (ZOLOFT) 50 MG tablet Take 1 tablet (50 mg total) by mouth daily. 2018-03-19   Currie Paris, NP    Family History Family History  Problem Relation Age of  Onset  . Hypertension Father   . Heart disease Father   . Cancer Maternal Aunt   . Cancer Maternal Uncle   . Hypertension Paternal Grandmother   . Cancer Maternal Grandmother   . Cancer Maternal Grandfather     Social History Social History   Tobacco Use  . Smoking status: Never Smoker  . Smokeless tobacco: Never Used  Substance Use Topics  . Alcohol use: No  . Drug use: No     Allergies   Patient has no known allergies.   Review of Systems Review of Systems  All other systems reviewed and are negative.    Physical Exam Updated Vital Signs BP 116/78 (BP Location: Right Arm)   Pulse (!) 107   Temp 99 F (37.2 C) (Oral)   Resp 17   SpO2 98%   Physical Exam Vitals signs and nursing note reviewed.  Constitutional:      Appearance: She is well-developed.  HENT:     Head: Normocephalic and atraumatic.     Comments: Rhinorrhea noted Eyes:     General: No scleral icterus.       Right eye: No discharge.  Left eye: No discharge.     Conjunctiva/sclera: Conjunctivae normal.     Pupils: Pupils are equal, round, and reactive to light.  Neck:     Musculoskeletal: Normal range of motion.     Vascular: No JVD.     Trachea: No tracheal deviation.  Pulmonary:     Effort: Pulmonary effort is normal. No respiratory distress.     Breath sounds: Normal breath sounds. No stridor. No wheezing, rhonchi or rales.  Neurological:     Mental Status: She is alert and oriented to person, place, and time.     Coordination: Coordination normal.  Psychiatric:        Behavior: Behavior normal.        Thought Content: Thought content normal.        Judgment: Judgment normal.      ED Treatments / Results  Labs (all labs ordered are listed, but only abnormal results are displayed) Labs Reviewed - No data to display  EKG None  Radiology No results found.  Procedures Procedures (including critical care time)  Medications Ordered in ED Medications - No data to  display   Initial Impression / Assessment and Plan / ED Course  I have reviewed the triage vital signs and the nursing notes.  Pertinent labs & imaging results that were available during my care of the patient were reviewed by me and considered in my medical decision making (see chart for details).        Patient's presentation was consistent with influenza.  Discussed treatment versus nontreatment she would like Tamiflu at this time understanding risks and benefits.  She has no signs of pulmonary distress, discharged with return precautions.  She verbalized understanding and agreement to today's plan had no further questions or concerns at time of discharge.  Final Clinical Impressions(s) / ED Diagnoses   Final diagnoses:  Influenza    ED Discharge Orders         Ordered    oseltamivir (TAMIFLU) 75 MG capsule  Every 12 hours     04/03/18 1259           Eyvonne Mechanic, PA-C 04/03/18 1522    Tegeler, Canary Brim, MD 04/03/18 [phone]

## 2018-04-03 NOTE — Discharge Instructions (Addendum)
Please read attached information. If you experience any new or worsening signs or symptoms please return to the emergency room for evaluation. Please follow-up with your primary care provider or specialist as discussed. Please use medication prescribed only as directed and discontinue taking if you have any concerning signs or symptoms.   °

## 2018-04-03 NOTE — ED Triage Notes (Signed)
Pt here for flu like symptoms since yesterday with fever. States family has had flu A.

## 2018-08-25 ENCOUNTER — Ambulatory Visit
Admission: EM | Admit: 2018-08-25 | Discharge: 2018-08-25 | Disposition: A | Discharge status: Home / Self Care | Payer: Medicaid Other

## 2018-08-25 ENCOUNTER — Other Ambulatory Visit: Payer: Self-pay

## 2018-08-25 DIAGNOSIS — S060X0A Concussion without loss of consciousness, initial encounter: Secondary | ICD-10-CM

## 2018-08-25 NOTE — Discharge Instructions (Signed)
No alarming signs on your exam. Ibuprofen 800mg  every 8 hours, tylenol 1000mg  every 8 hours as needed for headache. Avoid reading/writing, strenuous activity. Follow up with concussion clinic for further evaluation and management needed.   Head If experiencing worsening of symptoms, headache/blurry vision, nausea/vomiting, confusion/altered mental status, dizziness, weakness, passing out, imbalance, go to the emergency department for further evaluation.
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
## 2018-09-03 IMAGING — US US OB TRANSVAGINAL
1 series · 15 of 28 positions shown · non-contrast
Comparison: None.

CLINICAL DATA: Abdominal pain in pregnancy. Gestational age by last
menstrual period 5 weeks and 5 days. History of ectopic pregnancy.

EXAM:
OBSTETRIC <14 WK US AND TRANSVAGINAL OB US
TECHNIQUE: Both transabdominal and transvaginal ultrasound examinations were
performed for complete evaluation of the gestation as well as the
maternal uterus, adnexal regions, and pelvic cul-de-sac.
Transvaginal technique was performed to assess early pregnancy.

[Series 1: us ob transvaginal · 41 acquisitions, 15 frames shown]
[im 1/41]
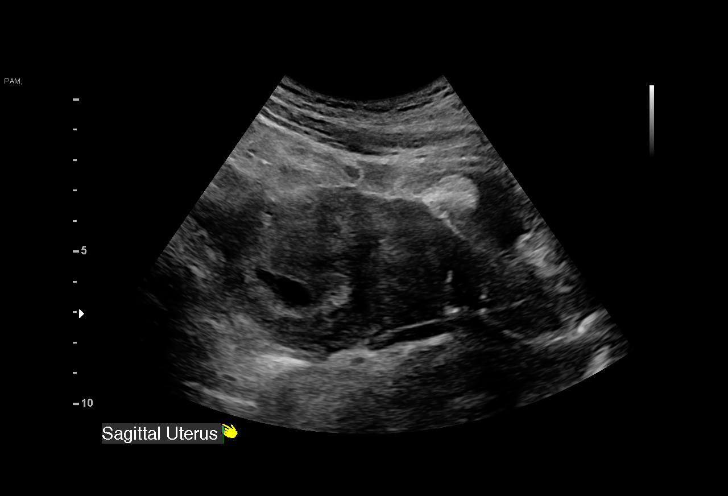
[im 3/41]
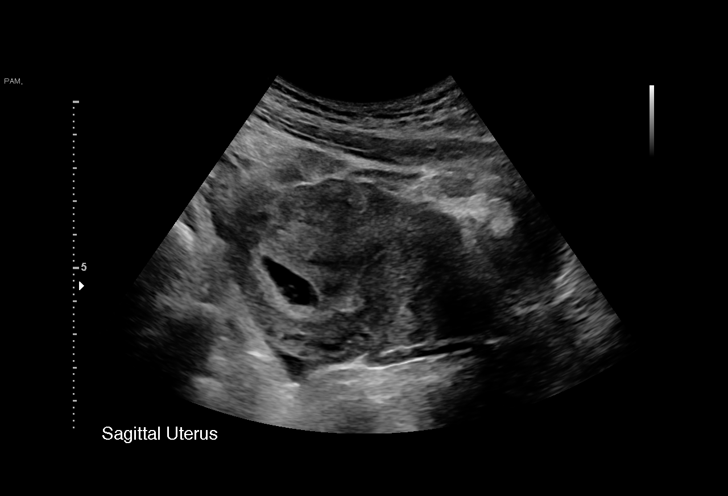
[im 6/41]
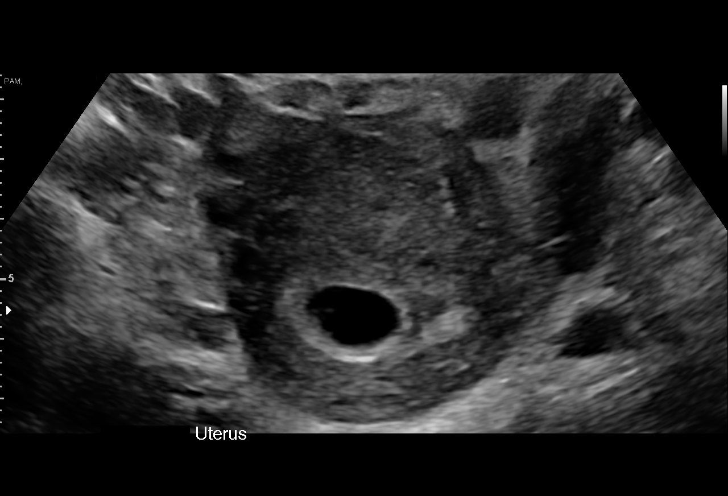
[im 9/41]
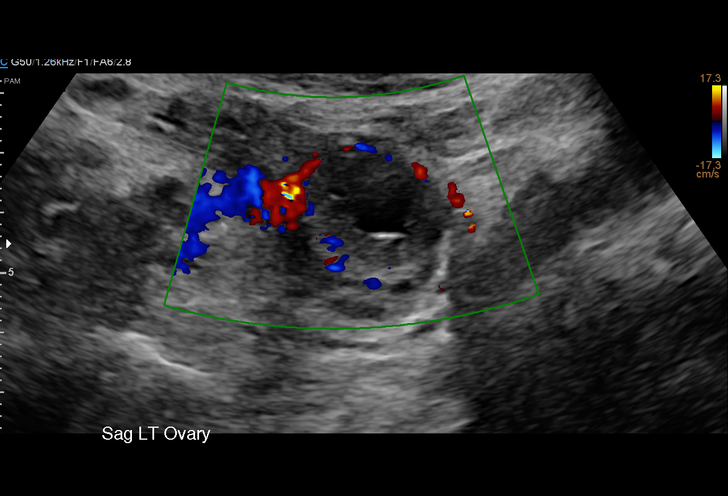
[im 12/41]
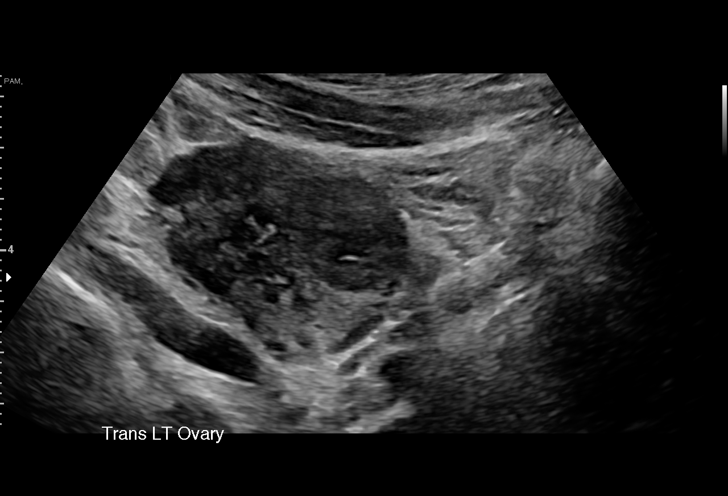
[im 15/41]
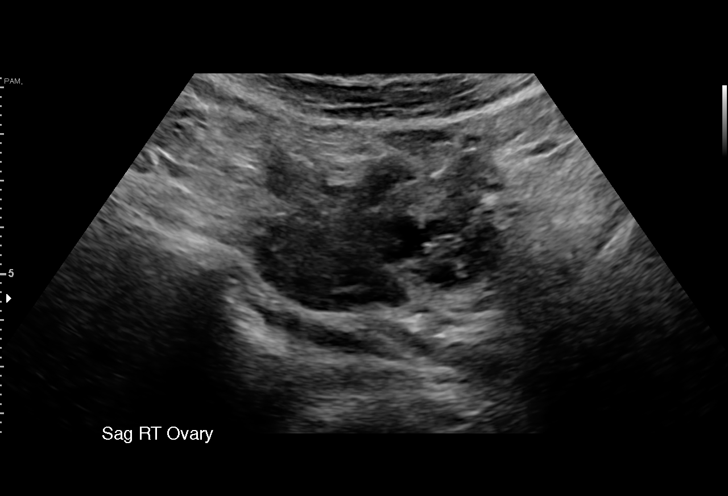
[im 18/41]
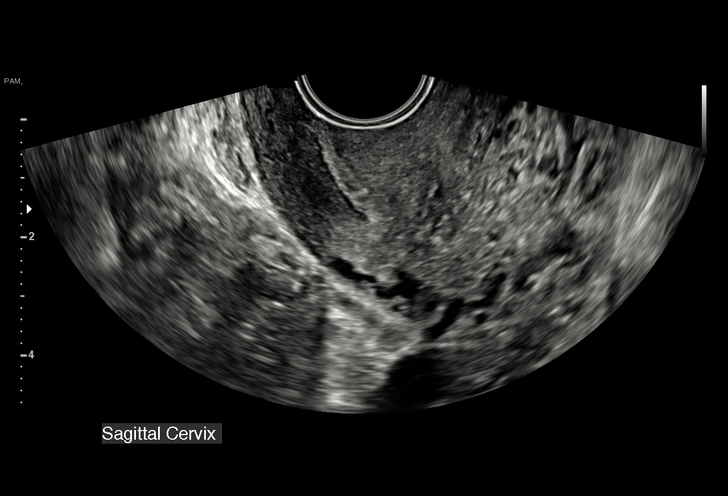
[im 21/41]
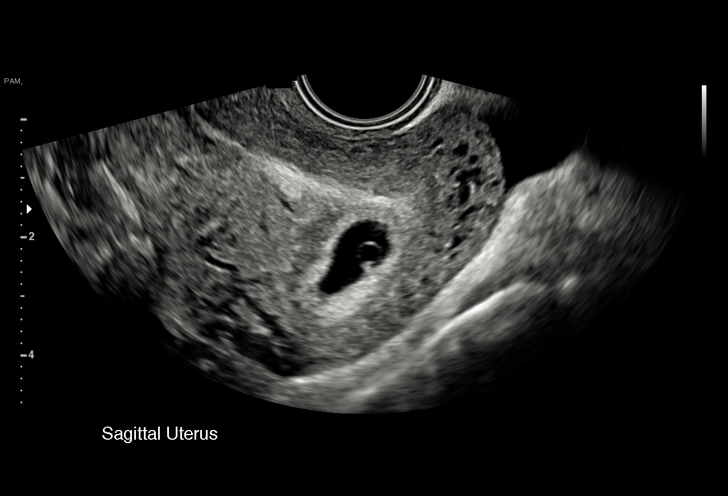
[im 23/41]
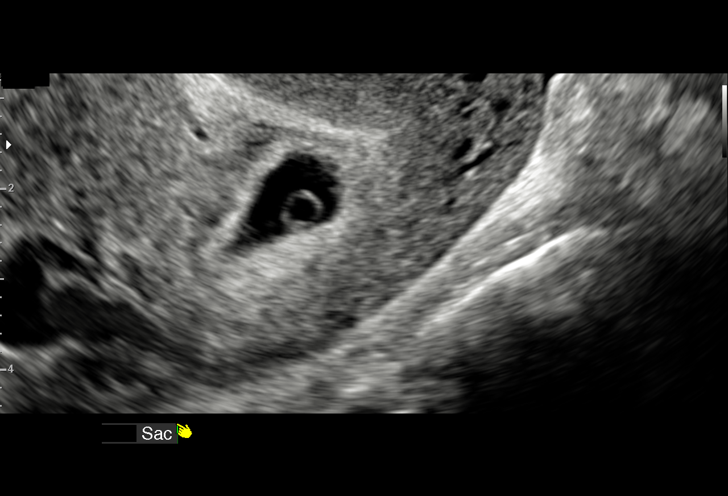
[im 26/41]
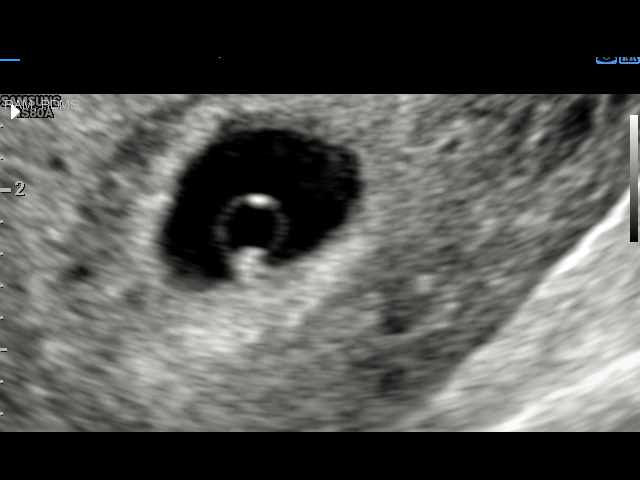
[im 29/41]
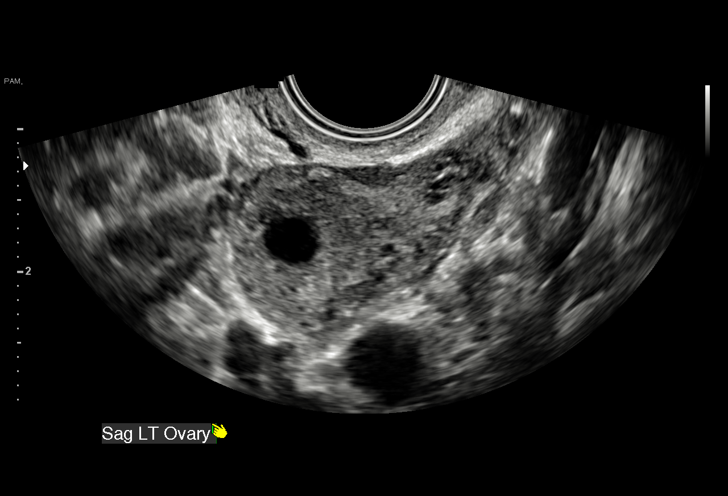
[im 32/41]
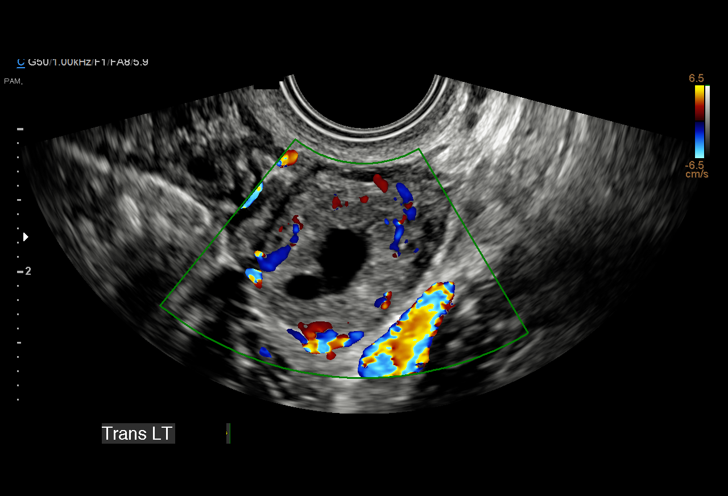
[im 35/41]
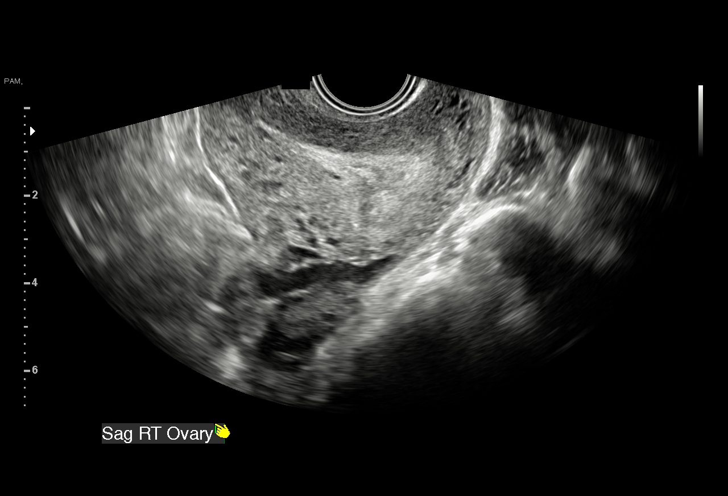
[im 38/41]
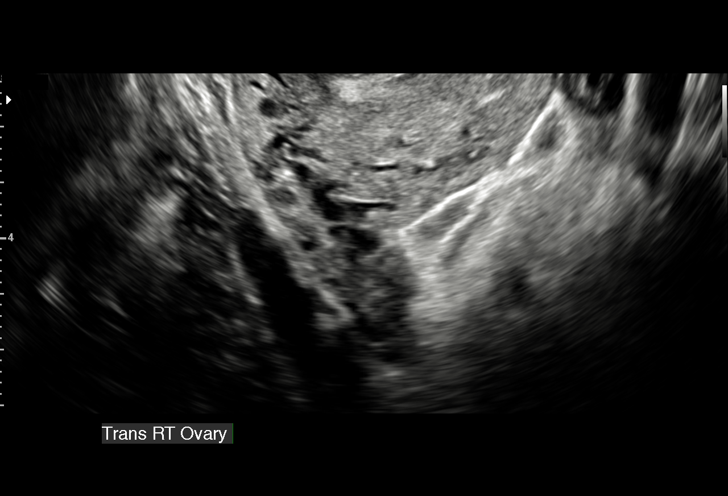
[im 41/41]
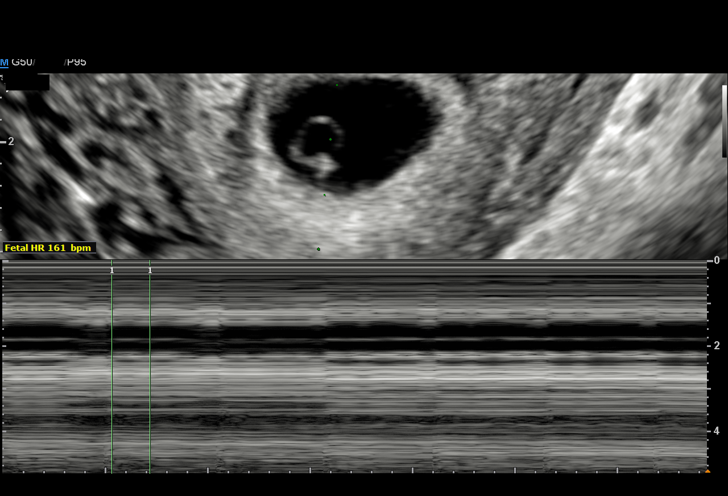

[15 of 28 positions shown; findings below may reference images not displayed]

FINDINGS: Intrauterine gestational sac: Present

Yolk sac:  Present

Embryo:  Present

Cardiac Activity: Present

Heart Rate: 155  bpm

CRL:  3  mm   5 w   5 d                  US EDC: February 19, 2017

Subchorionic hemorrhage:  None visualized.

Maternal uterus/adnexae: 2 cm ovarian LEFT corpus luteal cyst,
normal appearance the adnexae. No free fluid.
IMPRESSION: Single live intrauterine pregnancy, gestational age by ultrasound 5
weeks and 5 days without immediate complication.

## 2019-01-15 ENCOUNTER — Other Ambulatory Visit: Payer: Self-pay

## 2019-01-15 DIAGNOSIS — Z20822 Contact with and (suspected) exposure to covid-19: Secondary | ICD-10-CM

## 2019-01-17 LAB — NOVEL CORONAVIRUS, NAA: SARS-CoV-2, NAA: NOT DETECTED

## 2019-06-25 ENCOUNTER — Emergency Department (HOSPITAL_COMMUNITY): Payer: Medicaid Other

## 2019-06-25 ENCOUNTER — Encounter (HOSPITAL_COMMUNITY): Payer: Self-pay | Admitting: *Deleted

## 2019-06-25 ENCOUNTER — Emergency Department (HOSPITAL_COMMUNITY)
Admission: EM | Admit: 2019-06-25 | Discharge: 2019-06-25 | Disposition: A | Discharge status: Home / Self Care | Payer: Medicaid Other | Attending: Emergency Medicine | Admitting: Emergency Medicine

## 2019-06-25 DIAGNOSIS — J069 Acute upper respiratory infection, unspecified: Secondary | ICD-10-CM

## 2019-06-25 DIAGNOSIS — I10 Essential (primary) hypertension: Secondary | ICD-10-CM | POA: Insufficient documentation

## 2019-06-25 DIAGNOSIS — Z20822 Contact with and (suspected) exposure to covid-19: Secondary | ICD-10-CM | POA: Insufficient documentation

## 2019-06-25 LAB — SARS CORONAVIRUS 2 BY RT PCR (HOSPITAL ORDER, PERFORMED IN ~~LOC~~ HOSPITAL LAB): SARS Coronavirus 2: NEGATIVE

## 2019-06-25 LAB — RESPIRATORY PANEL BY PCR

## 2019-06-25 NOTE — Discharge Instructions (Addendum)
Your work-up today was overall reassuring.  There is no evidence of pneumonia on your chest x-ray.  Your EKG today was normal.  Please make sure to quarantine until you receive your COVID-19 swab results.  You may check your results via MyChart.  If your test is positive you will also receive a call from the health department.  If this is positive, please make sure to quarantine for additional 7 to 10 days.  Return to the ER if your symptoms worsen.  Please follow-up with your primary care doctor if your symptoms are not improving.    Read the instructions below on reasons to return to the emergency department and to learn more about your diagnosis.  Use over the counter medications for symptomatic relief as we discussed (musinex as a decongestant, Tylenol for fever/pain, Motrin/Ibuprofen for muscle aches, over the counter zyrtec/flonase).   Your more than welcome to return to the emergency department if symptoms worsen or become concerning.  Upper Respiratory Infection, Adult  An upper respiratory infection (URI) is also sometimes known as the common cold. Most people improve within 1 week, but symptoms can last up to 2 weeks. A residual cough may last even longer.   URI is most commonly caused by a virus. Viruses are NOT treated with antibiotics. You can easily spread the virus to others by oral contact. This includes kissing, sharing a glass, coughing, or sneezing. Touching your mouth or nose and then touching a surface, which is then touched by another person, can also spread the virus.   TREATMENT  Treatment is directed at relieving symptoms. There is no cure. Antibiotics are not effective, because the infection is caused by a virus, not by bacteria. Treatment may include:  Increased fluid intake. Sports drinks offer valuable electrolytes, sugars, and fluids.  Breathing heated mist or steam (vaporizer or shower).  Eating chicken soup or other clear broths, and maintaining good nutrition.    Getting plenty of rest.  Using gargles or lozenges for comfort.  Controlling fevers with ibuprofen or acetaminophen as directed by your caregiver.  Increasing usage of your inhaler if you have asthma.  Return to work when your temperature has returned to normal.   SEEK MEDICAL CARE IF:  After the first few days, you feel you are getting worse rather than better.  You develop worsening shortness of breath, or brown or red sputum. These may be signs of pneumonia.  You develop yellow or brown nasal discharge or pain in the face, especially when you bend forward. These may be signs of sinusitis.  You develop a fever, swollen neck glands, pain with swallowing, or white areas in the back of your throat. These may be signs of strep throat.

## 2019-06-25 NOTE — ED Provider Notes (Signed)
[facility] EMERGENCY DEPARTMENT Provider Note   CSN: [id number] Arrival date & time: 06/25/19  6834     History Chief Complaint  Patient presents with  . Sore Throat  . Nasal Congestion    Joy Rivera is a 29 y.o. female.  HPI  29 year old female with a history of hypertension, depression, genital herpes presents to the ER with a 2-day history of cough, sore throat or nasal congestion.  Patient states that her symptoms began 2 days ago and began with a cough and sore throat, with which then gradually progressed to nasal congestion.  She states that she has been coughing of yellow mucus with no blood.  Also endorses a green nasal discharge.  She states that she took some over-the-counter cough drops and cold and flu medicine which helped her improve, so the next day she did not take any other medications and her symptoms worsen again.  Last night she started to have a right-sided headache and feeling of pressure in her sinuses.  She also endorses some chest pain and upper abdominal discomfor since her symptom onset.  She denies any recent travel, does not use OCPs, is not sedentary, no history of autoimmune diseases.  She denies any fevers, chills, drooling, difficulty swallowing, voice change, hemoptysis, dysuria, hematuria, abnormal vaginal discharge/pain/bleeding, nausea, vomiting, diarrhea, hematochezia, dizziness, syncope, back pain.       Past Medical History:  Diagnosis Date  . Abnormal Pap smear   . Anxiety   . Depression   . Hypertension    chronic    Patient Active Problem List   Diagnosis Date Noted  . Adjustment disorder with depressed mood 04-Mar-2018  . Death of child 2018-03-04  . Genital herpes 01/02/2016    Past Surgical History:  Procedure Laterality Date  . THERAPEUTIC ABORTION       OB History    Gravida  7   Para  4   Term  2   Preterm  2   AB  3   Living  3     SAB  0   TAB  2   Ectopic  1   Multiple  0   Live Births  4           Family History  Problem Relation Age of Onset  . Hypertension Father   . Heart disease Father   . Cancer Maternal Aunt   . Cancer Maternal Uncle   . Hypertension Paternal Grandmother   . Cancer Maternal Grandmother   . Cancer Maternal Grandfather     Social History   Tobacco Use  . Smoking status: Never Smoker  . Smokeless tobacco: Never Used  Substance Use Topics  . Alcohol use: No  . Drug use: No    Home Medications Prior to Admission medications   Not on File    Allergies    Patient has no known allergies.  Review of Systems   Review of Systems  Constitutional: Positive for activity change, appetite change and fatigue. Negative for chills, fever and unexpected weight change.  HENT: Positive for congestion, postnasal drip, sinus pressure, sneezing and sore throat. Negative for drooling, ear discharge, ear pain, mouth sores, sinus pain, trouble swallowing and voice change.   Eyes: Negative for pain and visual disturbance.  Respiratory: Positive for cough and shortness of breath.   Cardiovascular: Positive for chest pain. Negative for palpitations.  Gastrointestinal: Positive for abdominal pain and nausea. Negative for vomiting.  Genitourinary: Negative for dysuria, hematuria,  vaginal bleeding and vaginal pain.  Musculoskeletal: Negative for arthralgias, back pain, neck pain and neck stiffness.  Skin: Negative for color change and rash.  Neurological: Negative for dizziness, seizures, syncope, weakness and headaches.  Psychiatric/Behavioral: Negative for confusion.  All other systems reviewed and are negative.   Physical Exam Updated Vital Signs BP 114/76   Pulse 83   Temp 98.1 F (36.7 C) (Oral)   Resp 14   Ht 5\' 3"  (1.6 m)   Wt 83 kg   LMP 06/14/2019 (Exact Date)   SpO2 98%   BMI 32.42 kg/m   Physical Exam Vitals and nursing note reviewed.  Constitutional:      General: She is not in acute distress.    Appearance: She is  well-developed. She is not ill-appearing, toxic-appearing or diaphoretic.  HENT:     Head: Normocephalic and atraumatic.     Right Ear: Tympanic membrane and ear canal normal. No swelling or tenderness.     Left Ear: Tympanic membrane normal.     Mouth/Throat:     Mouth: Mucous membranes are moist. No oral lesions.     Pharynx: Oropharynx is clear. Uvula midline. No pharyngeal swelling, oropharyngeal exudate, posterior oropharyngeal erythema or uvula swelling.     Tonsils: No tonsillar exudate or tonsillar abscesses.  Eyes:     Conjunctiva/sclera: Conjunctivae normal.  Cardiovascular:     Rate and Rhythm: Normal rate and regular rhythm.     Heart sounds: Normal heart sounds. No murmur.  Pulmonary:     Effort: Pulmonary effort is normal. No respiratory distress.     Breath sounds: Normal breath sounds. No stridor. No wheezing, rhonchi or rales.  Chest:     Chest wall: No tenderness.  Abdominal:     General: There is no distension.     Palpations: Abdomen is soft.     Tenderness: There is no abdominal tenderness.  Musculoskeletal:     Cervical back: Normal range of motion and neck supple.  Skin:    General: Skin is warm and dry.     Capillary Refill: Capillary refill takes less than 2 seconds.     Findings: No erythema or rash.  Neurological:     General: No focal deficit present.     Mental Status: She is alert and oriented to person, place, and time.  Psychiatric:        Mood and Affect: Mood normal.        Behavior: Behavior normal.     ED Results / Procedures / Treatments   Labs (all labs ordered are listed, but only abnormal results are displayed) Labs Reviewed  SARS CORONAVIRUS 2 BY RT PCR (HOSPITAL ORDER, [place] [facility])  RESP PANEL BY RT PCR (RSV, FLU A&B, COVID)  POC URINE PREG, ED    EKG EKG Interpretation  Date/Time:  Thursday Jun 25 2019 10:45:56 EDT Ventricular Rate:  68 PR Interval:    QRS Duration: 85 QT Interval:  376 QTC  Calculation: 400 R Axis:   70 Text Interpretation: Sinus rhythm ST elev, probable normal early repol pattern No significant change since last tracing Confirmed by Blanchie Dessert [phone]) on 06/25/2019 10:54:46 AM   Radiology DG Chest Portable 1 View  Result Date: 06/25/2019 CLINICAL DATA:  Shortness of breath. Additional provided: Patient reports chest and nasal congestion with cough for 2 days. EXAM: PORTABLE CHEST 1 VIEW COMPARISON:  Chest radiograph 06/18/2015 FINDINGS: Heart size within normal limits. There is no appreciable airspace consolidation. No  evidence of pleural effusion or pneumothorax. No acute bony abnormality identified. IMPRESSION: No evidence of acute cardiopulmonary abnormality. Electronically Signed   By: Jackey Loge DO   On: 06/25/2019 10:22    Procedures Procedures (including critical care time)  Medications Ordered in ED Medications - No data to display  ED Course  I have reviewed the triage vital signs and the nursing notes.  Pertinent labs & imaging results that were available during my care of the patient were reviewed by me and considered in my medical decision making (see chart for details).    MDM Rules/Calculators/A&P                     29 year old female with sore throat, cough, nasal pressure x2 days. On presentation to the ER, patient is alert and oriented, in no acute distress, nontoxic-appearing, tolerating secretions well.  Vitals overall reassuring.  Physical exam without evidence of abnormality.  She has no abdominal tenderness on exam.  Given patient's complaint of chest pain and shortness of breath, will order chest x-ray, EKG, Covid swab, flu panel per patient request.  She is PERC negative.  Doubt PE.  Throat nonerythematous, doubt strep with combination cough.    Chest x-ray without acute abnormalities.  EKG unchanged from previous.  Patient instructed on how to follow-up on her COVID-19 results via MyChart.  Instructed to quarantine until  she finds out the results of the test.  Encouraged over-the-counter Tylenol/ibuprofen, Mucinex, over-the-counter cold and flu medicine for treatment of her symptoms.  Return precautions given.   Most likely viral URI.  Patient voices understanding and is agreeable to the plan.  At this stage in the ED course, the patient has been adequately screened and is stable for discharge. Final Clinical Impression(s) / ED Diagnoses Final diagnoses:  Viral upper respiratory tract infection    Rx / DC Orders ED Discharge Orders    None       Leone Brand 06/25/19 1132    Gwyneth Sprout, MD 06/26/19 1020

## 2019-06-25 NOTE — ED Notes (Signed)
Patient verbalizes understanding of discharge instructions. Opportunity for questioning and answers were provided. Armband removed by staff, pt discharged from ED and ambulated to lobby to return to vehicle.

## 2019-06-25 NOTE — ED Triage Notes (Signed)
To ED for eval of congestion and cough - started a few days ago. Productive cough. Feels pressure behind her eyes. Unknown fever. No vomiting or diarrhea.

## 2019-10-11 ENCOUNTER — Other Ambulatory Visit: Payer: Self-pay

## 2019-10-11 ENCOUNTER — Emergency Department (HOSPITAL_COMMUNITY)
Admission: EM | Admit: 2019-10-11 | Discharge: 2019-10-11 | Disposition: A | Discharge status: Home / Self Care | Payer: HRSA Program | Attending: Emergency Medicine | Admitting: Emergency Medicine

## 2019-10-11 ENCOUNTER — Encounter (HOSPITAL_COMMUNITY): Payer: Self-pay | Admitting: Emergency Medicine

## 2019-10-11 DIAGNOSIS — I10 Essential (primary) hypertension: Secondary | ICD-10-CM | POA: Insufficient documentation

## 2019-10-11 DIAGNOSIS — J069 Acute upper respiratory infection, unspecified: Secondary | ICD-10-CM | POA: Diagnosis not present

## 2019-10-11 DIAGNOSIS — H9201 Otalgia, right ear: Secondary | ICD-10-CM | POA: Insufficient documentation

## 2019-10-11 DIAGNOSIS — J029 Acute pharyngitis, unspecified: Secondary | ICD-10-CM | POA: Diagnosis present

## 2019-10-11 DIAGNOSIS — U071 COVID-19: Secondary | ICD-10-CM | POA: Insufficient documentation

## 2019-10-11 LAB — SARS CORONAVIRUS 2 BY RT PCR (HOSPITAL ORDER, PERFORMED IN ~~LOC~~ HOSPITAL LAB): SARS Coronavirus 2: POSITIVE — AB

## 2019-10-11 NOTE — Discharge Instructions (Signed)
Your symptoms are likely related to a virus, take Tylenol or ibuprofen for pain or fever, you may take over-the-counter cough medication as needed for cough.  Your vital signs are normal.  You may have Covid, we have sent the test, you will be contacted if it is positive, it is positive you need to stay away from people and quarantine strictly for the next 10 days, you may return to work when you are fever free, symptoms are significantly improved that it is better than the last 10 days.  If your test is negative you can go back to work after a couple of days if you are feeling better.

## 2019-10-11 NOTE — ED Provider Notes (Signed)
[facility] EMERGENCY DEPARTMENT Provider Note   CSN: [id number] Arrival date & time: 10/11/19  1921     History Chief Complaint  Patient presents with  . Otalgia    Joy Rivera is a 29 y.o. female.  HPI   Patient is a 29 year old female, history of hypertension chronically, she presents with 2 days of upper respiratory symptoms including mild sore throat, right-sided ear pain, she is not vaccinated to codeine, she has no vomiting or diarrhea, symptoms are mild, he has not taken any over-the-counter medications for anything.  She has been exposed to someone who has been sick  Past Medical History:  Diagnosis Date  . Abnormal Pap smear   . Anxiety   . Depression   . Hypertension    chronic    Patient Active Problem List   Diagnosis Date Noted  . Adjustment disorder with depressed mood March 05, 2018  . Death of child 03-05-18  . Genital herpes 01/02/2016    Past Surgical History:  Procedure Laterality Date  . THERAPEUTIC ABORTION       OB History    Gravida  7   Para  4   Term  2   Preterm  2   AB  3   Living  3     SAB  0   TAB  2   Ectopic  1   Multiple  0   Live Births  4           Family History  Problem Relation Age of Onset  . Hypertension Father   . Heart disease Father   . Cancer Maternal Aunt   . Cancer Maternal Uncle   . Hypertension Paternal Grandmother   . Cancer Maternal Grandmother   . Cancer Maternal Grandfather     Social History   Tobacco Use  . Smoking status: Never Smoker  . Smokeless tobacco: Never Used  Vaping Use  . Vaping Use: Never used  Substance Use Topics  . Alcohol use: No  . Drug use: No    Home Medications Prior to Admission medications   Not on File    Allergies    Patient has no known allergies.  Review of Systems   Review of Systems  All other systems reviewed and are negative.   Physical Exam Updated Vital Signs BP 127/89 (BP Location: Right Arm)   Pulse  90   Temp 99.6 F (37.6 C) (Oral)   Resp 16   SpO2 100%   Physical Exam Vitals and nursing note reviewed.  Constitutional:      General: She is not in acute distress.    Appearance: She is well-developed.  HENT:     Head: Normocephalic and atraumatic.     Ears:     Comments: Bilateral tympanic membranes without any significant erythema bulging or opacifications    Mouth/Throat:     Pharynx: No oropharyngeal exudate.     Comments: Mild red throat, no exudate asymmetry or hypertrophy Eyes:     General: No scleral icterus.       Right eye: No discharge.        Left eye: No discharge.     Conjunctiva/sclera: Conjunctivae normal.     Pupils: Pupils are equal, round, and reactive to light.  Neck:     Thyroid: No thyromegaly.     Vascular: No JVD.     Comments: Mild cervical lymphadenopathy, no trismus or torticollis Cardiovascular:     Rate and Rhythm: Normal  rate and regular rhythm.     Heart sounds: Normal heart sounds. No murmur heard.  No friction rub. No gallop.   Pulmonary:     Effort: Pulmonary effort is normal. No respiratory distress.     Breath sounds: Normal breath sounds. No wheezing or rales.  Abdominal:     General: Bowel sounds are normal. There is no distension.     Palpations: Abdomen is soft. There is no mass.     Tenderness: There is no abdominal tenderness.  Musculoskeletal:        General: No tenderness. Normal range of motion.     Cervical back: Normal range of motion and neck supple.  Lymphadenopathy:     Cervical: Cervical adenopathy present.  Skin:    General: Skin is warm and dry.     Findings: No erythema or rash.  Neurological:     Mental Status: She is alert.     Coordination: Coordination normal.  Psychiatric:        Behavior: Behavior normal.     ED Results / Procedures / Treatments   Labs (all labs ordered are listed, but only abnormal results are displayed) Labs Reviewed  SARS CORONAVIRUS 2 BY RT PCR (HOSPITAL ORDER, PERFORMED IN  [facility])    EKG None  Radiology No results found.  Procedures Procedures (including critical care time)  Medications Ordered in ED Medications - No data to display  ED Course  I have reviewed the triage vital signs and the nursing notes.  Pertinent labs & imaging results that were available during my care of the patient were reviewed by me and considered in my medical decision making (see chart for details).    MDM Rules/Calculators/A&P                          , Likely viral URI, vital signs reviewed, patient examined and stable, test for Covid, discharge home, supportive care.   Final Clinical Impression(s) / ED Diagnoses Final diagnoses:  URI, acute    Rx / DC Orders ED Discharge Orders    None       Eber Hong, MD 10/11/19 [name]

## 2019-10-11 NOTE — ED Triage Notes (Signed)
Pt c/o right ear pain and right-sided throat pain.

## 2019-10-12 ENCOUNTER — Ambulatory Visit (HOSPITAL_COMMUNITY)
Admission: RE | Admit: 2019-10-12 | Discharge: 2019-10-12 | Disposition: A | Discharge status: Home / Self Care | Payer: Medicaid Other | Source: Ambulatory Visit | Attending: Pulmonary Disease | Admitting: Pulmonary Disease

## 2019-10-12 ENCOUNTER — Telehealth: Payer: Self-pay | Admitting: Unknown Physician Specialty

## 2019-10-12 VITALS — BP 122/80 | HR 57 | Temp 98.6°F | Resp 16

## 2019-10-12 DIAGNOSIS — U071 COVID-19: Secondary | ICD-10-CM | POA: Insufficient documentation

## 2019-10-12 MED ORDER — SODIUM CHLORIDE 0.9 % IV SOLN: INTRAVENOUS | Status: DC | PRN

## 2019-10-12 MED ORDER — DIPHENHYDRAMINE HCL 50 MG/ML IJ SOLN: 50.0000 mg | Freq: Once | INTRAMUSCULAR | Status: DC | PRN

## 2019-10-12 MED ORDER — FAMOTIDINE IN NACL 20-0.9 MG/50ML-% IV SOLN: 20.0000 mg | Freq: Once | INTRAVENOUS | Status: DC | PRN

## 2019-10-12 MED ORDER — EPINEPHRINE 0.3 MG/0.3ML IJ SOAJ: 0.3000 mg | Freq: Once | INTRAMUSCULAR | Status: DC | PRN

## 2019-10-12 MED ORDER — SODIUM CHLORIDE 0.9 % IV SOLN
1200.0000 mg | Freq: Once | INTRAVENOUS | Status: AC
  Administered 2019-10-12: 1200 mg via INTRAVENOUS
  Filled 2019-10-12: qty 10

## 2019-10-12 MED ORDER — METHYLPREDNISOLONE SODIUM SUCC 125 MG IJ SOLR: 125.0000 mg | Freq: Once | INTRAMUSCULAR | Status: DC | PRN

## 2019-10-12 MED ORDER — ALBUTEROL SULFATE HFA 108 (90 BASE) MCG/ACT IN AERS: 2.0000 | INHALATION_SPRAY | Freq: Once | RESPIRATORY_TRACT | Status: DC | PRN

## 2019-10-12 NOTE — Telephone Encounter (Signed)
I connected by phone with Joy Rivera on 10/12/2019 at 9:36 AM to discuss the potential use of a new treatment for mild to moderate COVID-19 viral infection in non-hospitalized patients.  This patient is a 29 y.o. female that meets the FDA criteria for Emergency Use Authorization of COVID monoclonal antibody casirivimab/imdevimab.  Has a (+) direct SARS-CoV-2 viral test result  Has mild or moderate COVID-19   Is NOT hospitalized due to COVID-19  Is within 10 days of symptom onset  Has at least one of the high risk factor(s) for progression to severe COVID-19 and/or hospitalization as defined in EUA.  Specific high risk criteria : BMI > 25   I have spoken and communicated the following to the patient or parent/caregiver regarding COVID monoclonal antibody treatment:  1. FDA has authorized the emergency use for the treatment of mild to moderate COVID-19 in adults and pediatric patients with positive results of direct SARS-CoV-2 viral testing who are 36 years of age and older weighing at least 40 kg, and who are at high risk for progressing to severe COVID-19 and/or hospitalization.  2. The significant known and potential risks and benefits of COVID monoclonal antibody, and the extent to which such potential risks and benefits are unknown.  3. Information on available alternative treatments and the risks and benefits of those alternatives, including clinical trials.  4. Patients treated with COVID monoclonal antibody should continue to self-isolate and use infection control measures (e.g., wear mask, isolate, social distance, avoid sharing personal items, clean and disinfect "high touch" surfaces, and frequent handwashing) according to CDC guidelines.   5. The patient or parent/caregiver has the option to accept or refuse COVID monoclonal antibody treatment.  After reviewing this information with the patient, The patient agreed to proceed with receiving casirivimab\imdevimab infusion  and will be provided a copy of the Fact sheet prior to receiving the infusion. Gabriel Cirri 10/12/2019 9:36 AM Sx onset 9/3

## 2019-10-12 NOTE — Discharge Instructions (Signed)

## 2019-10-13 ENCOUNTER — Other Ambulatory Visit: Payer: Self-pay

## 2020-01-10 IMAGING — US US MFM OB DETAIL+14 WK
1 series · 13 of 28 positions shown · non-contrast
Comparison: none

[Series 1: us mfm ob detail+14 wk · 129 acquisitions, 13 frames shown]
[im 5/129]
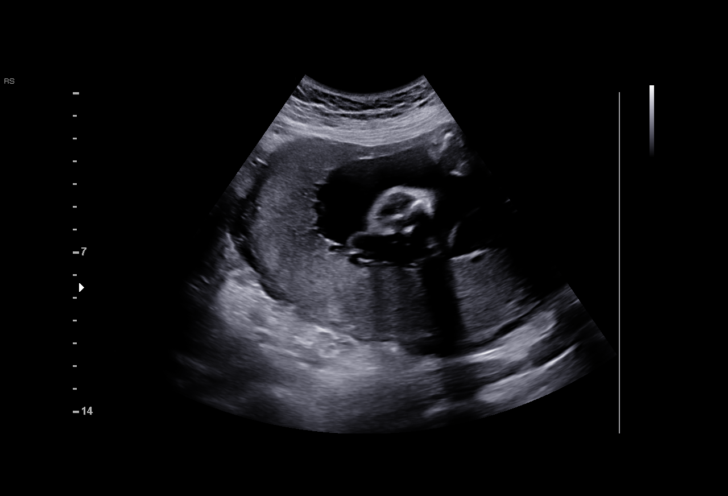
[im 15/129]
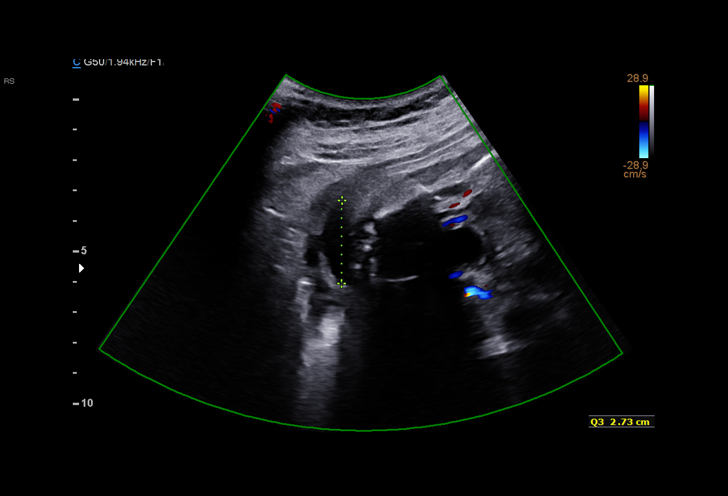
[im 24/129]
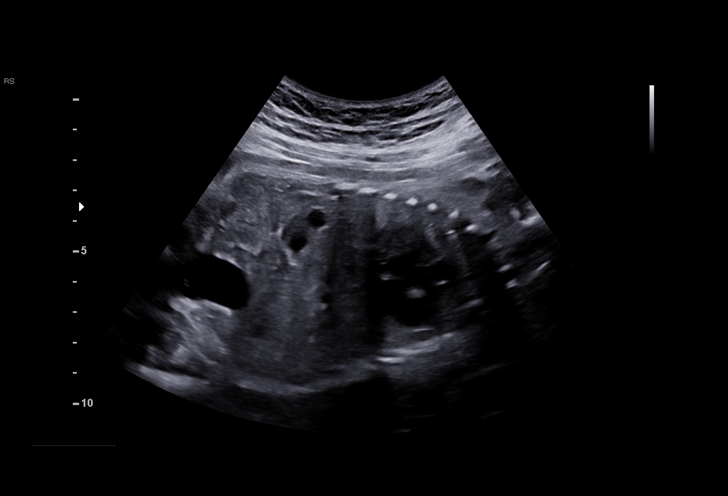
[im 34/129]
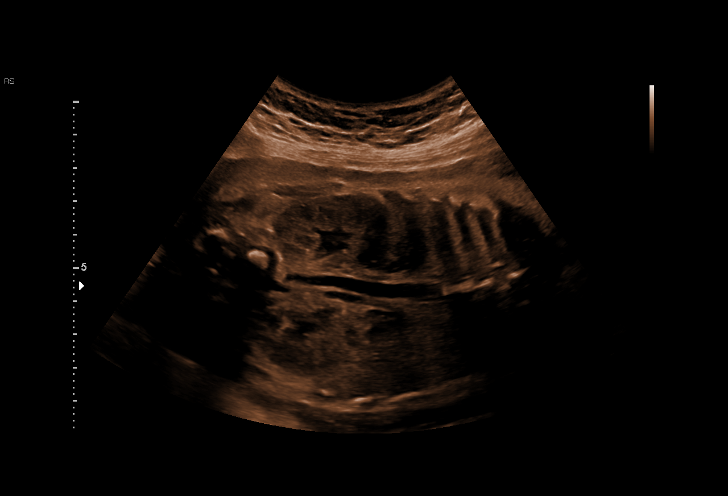
[im 43/129]
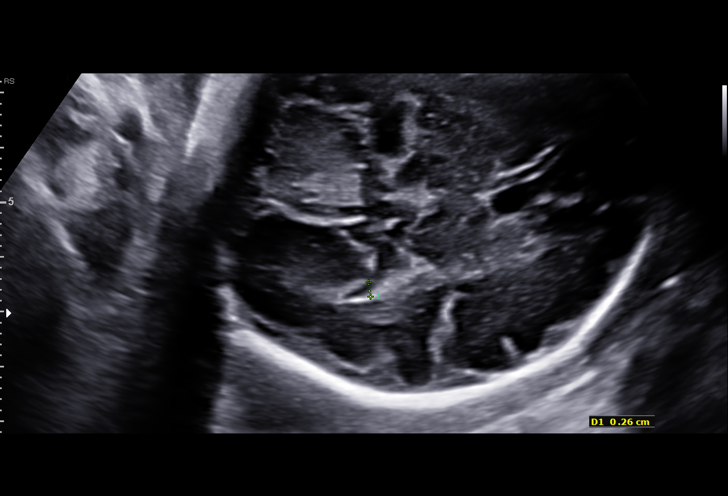
[im 53/129]
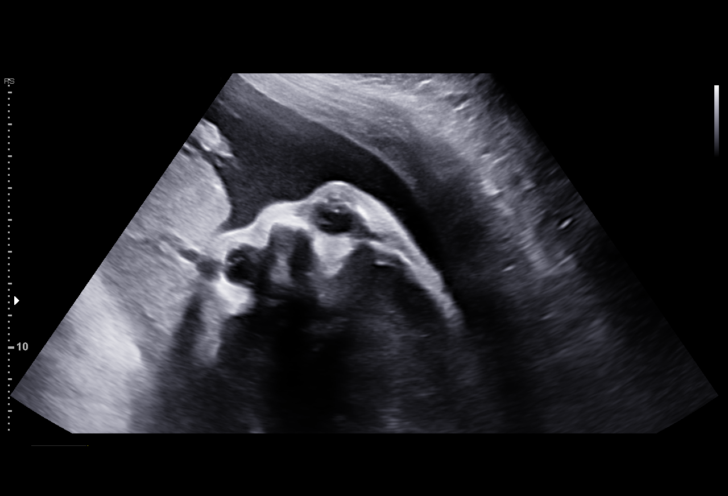
[im 67/129]
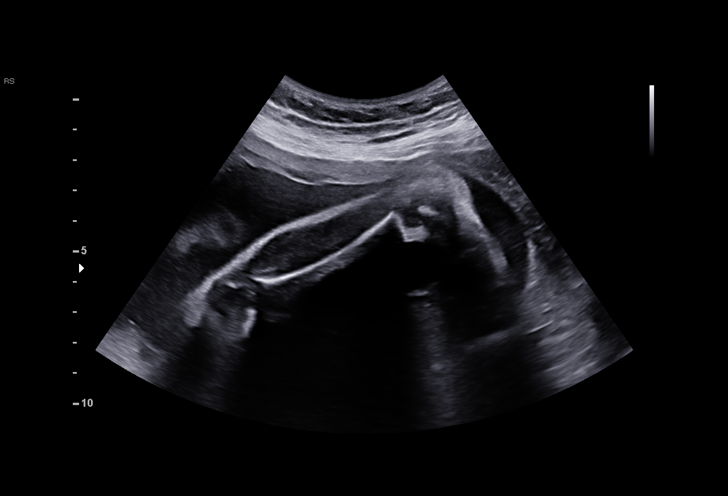
[im 76/129]
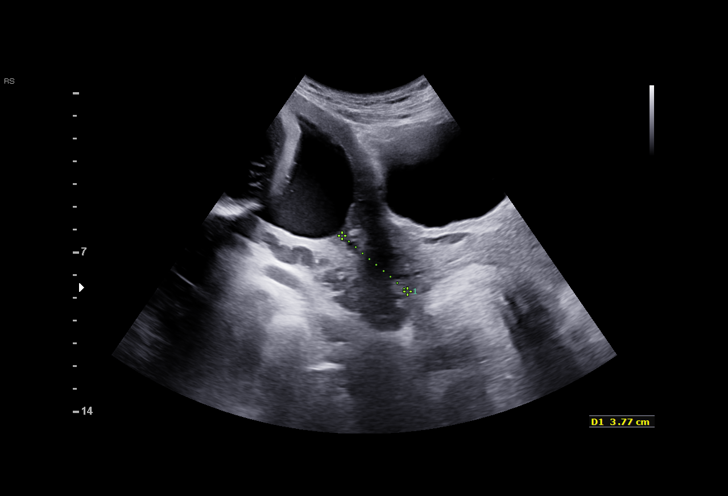
[im 86/129]
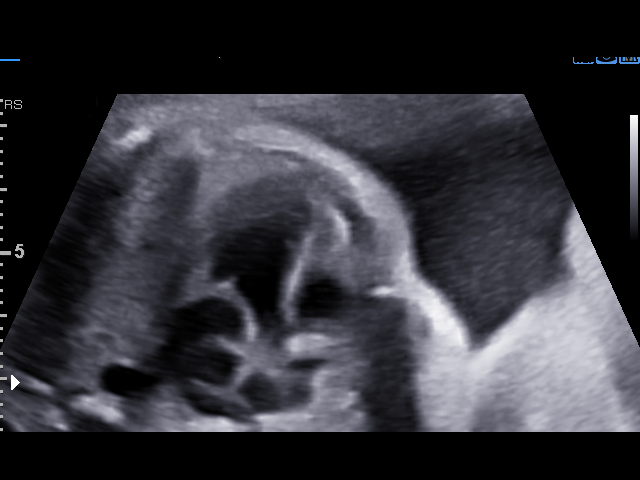
[im 95/129]
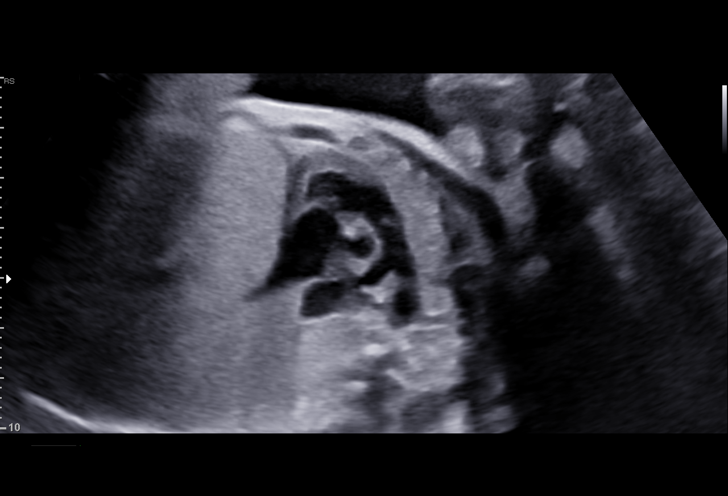
[im 105/129]
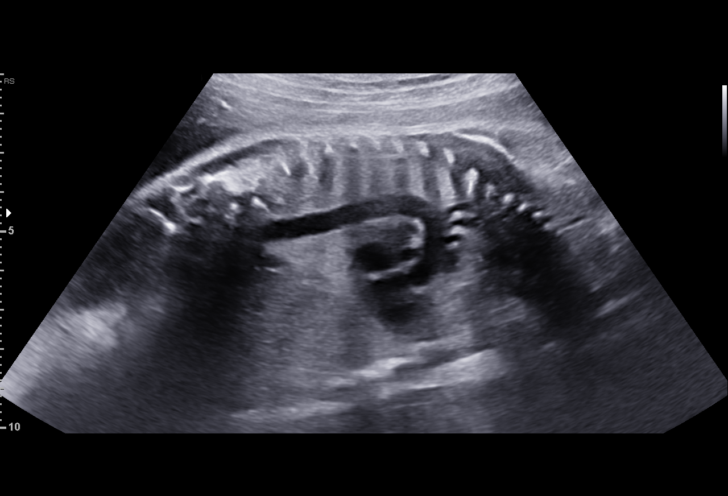
[im 114/129]
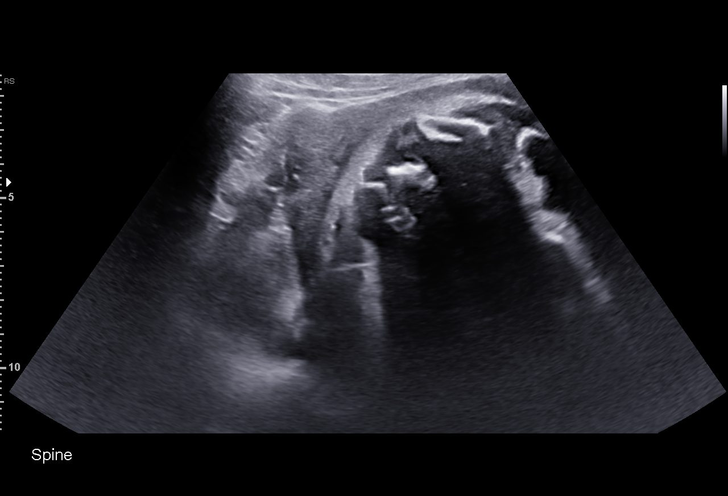
[im 124/129]
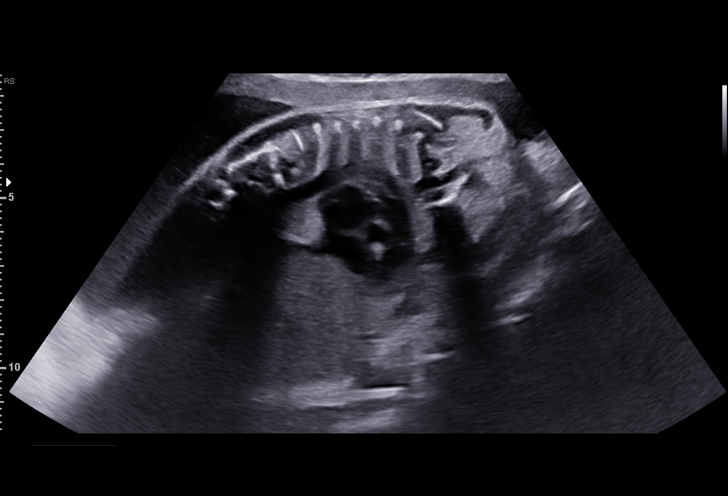

[13 of 28 positions shown; findings below may reference images not displayed]

Attending:        Baekdoo Namhui        Secondary Phy.:   [facility]-
MAU/Triage

1  US MFM OB DETAIL +14 WK              76811.01     NAYID TANDIOY

Indications

30 weeks gestation of pregnancy
Encounter for antenatal screening for
malformations
Poor obstetric history: Previous preterm
delivery, antepartum x 2 @ 33 and 02wks
Poor obstetric history: Previous
preeclampsia / eclampsia/gestational HTN
Poor obstetrical history (previous abruption)
Fetal Evaluation

Num Of Fetuses:         1
Fetal Heart Rate(bpm):  129
Cardiac Activity:       Observed
Presentation:           Cephalic
Placenta:               Posterior
P. Cord Insertion:      Visualized, central

Amniotic Fluid
AFI FV:      Within normal limits

AFI Sum(cm)     %Tile       Largest Pocket(cm)
12.65           35

RUQ(cm)       RLQ(cm)       LUQ(cm)        LLQ(cm)
4.52
Biometry

BPD:        71  mm     G. Age:  28w 4d          4  %    CI:        68.92   %    70 - 86
FL/HC:      20.2   %    19.2 -
HC:      273.2  mm     G. Age:  29w 6d          9  %    HC/AC:      1.04        0.99 -
AC:      261.7  mm     G. Age:  30w 2d         46  %    FL/BPD:     77.7   %    71 - 87
FL:       55.2  mm     G. Age:  29w 1d         10  %    FL/AC:      21.1   %    20 - 24
HUM:      50.7  mm     G. Age:  29w 5d         40  %
CER:      35.5  mm     G. Age:  30w 4d         52  %

CM:          4  mm

Est. FW:    3888  gm      3 lb 3 oz     42  %
OB History

Gravidity:    7         Term:   1        Prem:   1        SAB:   1
TOP:          2       Ectopic:  1        Living: 2
Gestational Age

U/S Today:     29w 3d                                        EDD:   01/13/18
Best:          30w 2d     Det. By:  Previous Ultrasound      EDD:   01/07/18
(07/05/17)
Anatomy

Cranium:               Appears normal         Aortic Arch:            Appears normal
Cavum:                 Appears normal         Ductal Arch:            Appears normal
Ventricles:            Appears normal         Diaphragm:              Appears normal
Choroid Plexus:        Appears normal         Stomach:                Appears normal, left
sided
Cerebellum:            Appears normal         Abdomen:                Appears normal
Posterior Fossa:       Appears normal         Abdominal Wall:         Appears nml (cord
insert, abd wall)
Nuchal Fold:           Not applicable (>20    Cord Vessels:           Appears normal (3
wks GA)                                        vessel cord)
Face:                  Orbits nl; profile not Kidneys:                Appear normal
well visualized
Lips:                  Appears normal         Bladder:                Appears normal
Thoracic:              Appears normal         Spine:                  Limited views
appear normal
Heart:                 Appears normal         Upper Extremities:      Appears normal
(4CH, axis, and situs
RVOT:                  Appears normal         Lower Extremities:      Appears normal
LVOT:                  Appears normal

Other:  Heels visualized. Open hands visualized. Technically difficult due to
advanced gestational age. Parents do not wish to know sex of fetus.
Female gender
Cervix Uterus Adnexa

Cervix
Normal appearance by transabdominal scan.

Uterus
No abnormality visualized.

Left Ovary
Within normal limits.

Right Ovary
Within normal limits.

Adnexa
No abnormality visualized. No adnexal mass
visualized.
Impression

Ms. Enes  Samra is here for fetal anatomy scan.
Her obstetric history include:
-[DATE]: Term vaginal delivery of a male infant.
-[DATE]: Preterm delivery (33 weeks) and her pregnancy
was complicated by placental abruption. Patient went into
spontaneous labor before delivery. Her son is in good health.
-[DATE]: Pregnancy was complicated by preeclampsia with
severe features and she delivered a female infant at 36w 5d
gestation. Unfortunately, the infant died at 3 months from
complications of flu infection (?).

Her current pregnancy was complicated by vaginal bleeding
at 26 weeks. Her pregnancy is well-dated by first-trimester
ultrasound (13 weeks) performed on 07/05/17.
She does not have hypertension or any other chronic medical
conditions now. She takes low-dose aspirin because of her
history of preeclampsia.

On ultrasound, the fetal growth is appropriate for the
gestational age. Amniotic fluid is normal and good fetal
activity is seen. Fetal anatomy appears normal, but limited
because of advanced gestational age. On transabdominal
scan, the cervix looks long and closed.

I reassured the patient of the findings.

Placental abruption can recur in subsequent pregnancies
(5% to 10%). In the absence of hypertensive complications,
we would expect good pregnancy outcome.

Today's BP at our office: 117/75 mm Hg.
Recommendations

An appointment was made for her to return in 4 weeks for
fetal growth assessment.

## 2022-05-16 ENCOUNTER — Other Ambulatory Visit: Payer: Self-pay

## 2022-05-16 ENCOUNTER — Encounter: Payer: Self-pay | Admitting: Obstetrics and Gynecology

## 2022-05-16 ENCOUNTER — Ambulatory Visit (INDEPENDENT_AMBULATORY_CARE_PROVIDER_SITE_OTHER): Payer: Medicaid Other | Admitting: Obstetrics and Gynecology

## 2022-05-16 VITALS — BP 116/79 | HR 74 | Ht 63.0 in | Wt 218.2 lb

## 2022-05-16 DIAGNOSIS — Z309 Encounter for contraceptive management, unspecified: Secondary | ICD-10-CM

## 2022-05-16 DIAGNOSIS — Z3046 Encounter for surveillance of implantable subdermal contraceptive: Secondary | ICD-10-CM | POA: Diagnosis not present

## 2022-05-16 HISTORY — PX: REMOVAL OF IMPLANON ROD: OBO 1006

## 2022-05-16 NOTE — Progress Notes (Unsigned)
Obstetrics and Gynecology New Patient Evaluation  Appointment Date: 05/16/2022  OBGYN Clinic: [facility]   Primary Care Provider: Jackie Plum   Chief Complaint: Nexplanon removal, weight gain  History of Present Illness: Joy Rivera is a 32 y.o. African-American [id number] (LMP: 05/15/22), seen for the above chief complaint. Her past medical history is significant for BMI 38   Patient has had Nexplanon since January 2020, placed at her PP visit. Patient states she believes weight gain is due to this. Patient was in the 170s-180s PP and is currently 218 lbs; patient has only used OCPs in the past.   Review of Systems: Pertinent items noted in HPI and remainder of comprehensive ROS otherwise negative.    Past Medical History:  Past Medical History:  Diagnosis Date   Abnormal Pap smear    Anxiety    Depression    Gestational hypertension    chronic    Past Surgical History:  Past Surgical History:  Procedure Laterality Date   THERAPEUTIC ABORTION      Past Obstetrical History:  OB History  Gravida Para Term Preterm AB Living  7 4 2 2 3 3   SAB IAB Ectopic Multiple Live Births  0 2 1 0 4    # Outcome Date GA Lbr Len/2nd Weight Sex Delivery Anes PTL Lv  7 Term 12/21/17 [redacted]w[redacted]d 04:27 / 01:09 5 lb 13 oz (2.637 kg) F Vag-Spont EPI  LIV     Birth Comments: WNL  6 Preterm 01/27/17 [redacted]w[redacted]d   F Vag-Spont   DEC     Birth Comments: Infant died at 44 months of age d/t complications from the flu     Complications: Pre-eclampsia, severe  5 Preterm 03/04/16 [redacted]w[redacted]d 12:35 / 00:06 4 lb 8.3 oz (2.05 kg) M Vag-Spont EPI Y LIV     Birth Comments: see NICU note     Complications: Abruptio Placenta  4 Term 11/12/12 [redacted]w[redacted]d 21:40 / 01:08 7 lb 8.1 oz (3.405 kg) M Vag-Spont EPI  LIV  3 Ectopic           2 IAB           1 IAB             Past Gynecological History: As per HPI. History of Pap Smear(s): Yes.   Last pap 2018, which was wnl  Social  History:  Social History   Socioeconomic History   Marital status: Single    Spouse name: Not on file   Number of children: Not on file   Years of education: Not on file   Highest education level: Not on file  Occupational History   Not on file  Tobacco Use   Smoking status: Never   Smokeless tobacco: Never  Vaping Use   Vaping Use: Never used  Substance and Sexual Activity   Alcohol use: No   Drug use: No   Sexual activity: Yes    Birth control/protection: None  Other Topics Concern   Not on file  Social History Narrative   Not on file   Social Determinants of Health   Financial Resource Strain: Not on file  Food Insecurity: Not on file  Transportation Needs: Not on file  Physical Activity: Not on file  Stress: Not on file  Social Connections: Not on file  Intimate Partner Violence: Not on file    Family History:  Family History  Problem Relation Age of Onset   Hypertension Father    Heart  disease Father    Cancer Maternal Aunt    Cancer Maternal Uncle    Hypertension Paternal Grandmother    Cancer Maternal Grandmother    Cancer Maternal Grandfather     Medications Aiana A. Kristoff had no medications administered during this visit. No current outpatient medications on file.   No current facility-administered medications for this visit.    Allergies Patient has no known allergies.   Physical Exam:  BP 116/79   Pulse 74   Ht 5\' 3"  (1.6 m)   Wt 218 lb 3.2 oz (99 kg)   BMI 38.65 kg/m  Body mass index is 38.65 kg/m.  General appearance: Well nourished, well developed female in no acute distress.  Respiratory:  Normal respiratory effort Neuro/Psych:  Normal mood and affect.  Skin:  Warm and dry.  Pelvic exam: declined today  See procedure note for nexplanon removal  Laboratory: none  Radiology: none  Assessment: patient stable.  Plan: *** 1. Encounter for surveillance of implantable subdermal contraceptive ***  No orders of the  defined types were placed in this encounter.   RTC ***  Return in about 1 week (around 05/23/2022) for in person, md or app.  No future appointments.  Cornelia Copa MD Attending [facility] for [facility])

## 2022-05-16 NOTE — Procedures (Signed)
Nexplanon Removal Procedure Note After informed consent was obtained, the patient's left arm was examined and the Nexplanon rod was noted to be easily palpable. The area was cleaned with alcohol then local anesthesia was infiltrated with 3 ml of 1% lidocaine with epinephrine. The area was prepped with betadine. Using sterile technique, an 11 blade was used to make an incision, and the Nexplanon device was brought to the incision site. The capsule was scrapped off with the scalpel, the Nexplanon grasped with hemostats, and easily removed; the removal site was hemostatic. The Nexplanon was inspected and noted to be intact.  A steri-strip and a pressure dressing was applied.  The patient tolerated the procedure well.  She chose to do Paragard for birth control, but she desires placement when period is over. Patient will need pap smear then, too  RTC 1 week  Sea Bright Bing, Montez Hageman MD Attending [facility])

## 2022-05-16 NOTE — Progress Notes (Unsigned)
Pt wants to talk about other [facility] such as the Patch, depo...ect

## 2022-05-17 ENCOUNTER — Encounter: Payer: Self-pay | Admitting: Obstetrics and Gynecology

## 2022-08-13 ENCOUNTER — Ambulatory Visit (INDEPENDENT_AMBULATORY_CARE_PROVIDER_SITE_OTHER): Payer: Medicaid Other | Admitting: Family Medicine

## 2022-08-13 DIAGNOSIS — Z3201 Encounter for pregnancy test, result positive: Secondary | ICD-10-CM

## 2022-08-13 DIAGNOSIS — Z3491 Encounter for supervision of normal pregnancy, unspecified, first trimester: Secondary | ICD-10-CM

## 2022-08-13 DIAGNOSIS — N912 Amenorrhea, unspecified: Secondary | ICD-10-CM

## 2022-08-13 LAB — POCT PREGNANCY, URINE: Preg Test, Ur: POSITIVE — AB

## 2022-08-13 MED ORDER — PREPLUS 27-1 MG PO TABS: 1.0000 | ORAL_TABLET | Freq: Every day | ORAL | 11 refills | Status: AC

## 2022-08-13 NOTE — Progress Notes (Unsigned)
Possible Pregnancy  Here today for pregnancy confirmation; patient left urine same to be tested in office and to be called with results. Called patient at 1630 at [phone] and identified patient with full name and DOB. UPT in office today is positive. Pt reports first positive home UPT on 08/10/22. Reviewed dating with patient:   LMP: 07/13/22; irregular periods before getting pregnant and this LMP is only approximate, pt unsure of exact date  EDD: 04/19/2023 4w 3d today  Patient is now a G8P4 with hx of genital herpes, PTL and preterm delivery (one at [redacted]w[redacted]d-still living and one at [redacted]w[redacted]d-living at birth but passed 3 months later post-delivery), pre-eclampsia with SF, GHTN, and placental abruption (with preterm delivery at [redacted]w[redacted]d). Patient reports no bleeding noted, but some lower abdominal cramping. Patient recently Dx'd and currently being treated for trichomonas (treatment starting on 08/03/22, metronidazole--pt unsure of partner treatment at this time). Reviewed trich treatment for both patient and partner; reviewed when to go to hospital for evaluation if bleeding occurs and/or if pain increases. Scheduled patient for dating ultrasound on Thursday July 11th at 2:30PM, and notified patient via MyChart with patient permission. Sent message to front office staff to reach out to patient and schedule new OB intake and new OB prenatal appointments.   OB history reviewed. Reviewed medications and allergies with patient; list of medications safe to take during pregnancy given.  Recommended pt begin prenatal vitamin and schedule prenatal care.  Meryl Crutch, RN 08/13/2022  4:29 PM  Attestation of Attending Supervision of clinical support staff: I agree with the care provided to this patient and was available for any consultation.  I have reviewed the RN's note and chart. I was available for consult and to see the patient if needed.   Federico Flake, MD, MPH, ABFM Attending Physician Faculty  Practice- [facility]

## 2022-08-15 ENCOUNTER — Encounter: Payer: Self-pay | Admitting: Family Medicine

## 2022-08-16 ENCOUNTER — Ambulatory Visit (HOSPITAL_COMMUNITY)
Admission: RE | Admit: 2022-08-16 | Discharge: 2022-08-16 | Disposition: A | Discharge status: Home / Self Care | Payer: Self-pay | Source: Ambulatory Visit | Attending: Family Medicine | Admitting: Family Medicine

## 2022-08-16 ENCOUNTER — Encounter: Payer: Self-pay | Admitting: Family Medicine

## 2022-08-16 DIAGNOSIS — Z3491 Encounter for supervision of normal pregnancy, unspecified, first trimester: Secondary | ICD-10-CM | POA: Insufficient documentation

## 2022-08-16 DIAGNOSIS — Z3687 Encounter for antenatal screening for uncertain dates: Secondary | ICD-10-CM | POA: Insufficient documentation

## 2022-08-16 DIAGNOSIS — Z3A01 Less than 8 weeks gestation of pregnancy: Secondary | ICD-10-CM | POA: Insufficient documentation

## 2024-01-09 ENCOUNTER — Encounter: Payer: Self-pay | Admitting: *Deleted

## 2024-01-09 ENCOUNTER — Ambulatory Visit (INDEPENDENT_AMBULATORY_CARE_PROVIDER_SITE_OTHER): Payer: Self-pay | Admitting: *Deleted

## 2024-01-09 DIAGNOSIS — Z3200 Encounter for pregnancy test, result unknown: Secondary | ICD-10-CM

## 2024-01-09 DIAGNOSIS — Z3201 Encounter for pregnancy test, result positive: Secondary | ICD-10-CM

## 2024-01-09 LAB — POCT PREGNANCY, URINE: Preg Test, Ur: POSITIVE — AB

## 2024-01-09 NOTE — Patient Instructions (Signed)

## 2024-01-09 NOTE — Progress Notes (Signed)
 Joy Rivera dropped off a urine for a pregnancy test which was positive. I called and left a message I am calling with results and will call once more; please be available to answer my call. Rock Skip PEAK 11:00 I called [name] again and heard message she is unable to take my call. I left a message I am calling with results and since I am not able to reach you: I will send a detailed MyChart message for you to read and respond to. Rock Skip PEAK
